# Patient Record
Sex: Female | Born: 1935 | Race: White | Hispanic: No | Marital: Single | Smoking: Never smoker
Health system: Southern US, Community
[De-identification: ages and names within clinical notes are randomized; demographics above are authoritative.]

## PROBLEM LIST (undated history)

## (undated) DIAGNOSIS — M542 Cervicalgia: Secondary | ICD-10-CM

## (undated) DIAGNOSIS — K219 Gastro-esophageal reflux disease without esophagitis: Secondary | ICD-10-CM

## (undated) DIAGNOSIS — H9190 Unspecified hearing loss, unspecified ear: Secondary | ICD-10-CM

## (undated) DIAGNOSIS — E785 Hyperlipidemia, unspecified: Secondary | ICD-10-CM

## (undated) DIAGNOSIS — R51 Headache: Secondary | ICD-10-CM

## (undated) DIAGNOSIS — H9192 Unspecified hearing loss, left ear: Secondary | ICD-10-CM

## (undated) DIAGNOSIS — N3020 Other chronic cystitis without hematuria: Secondary | ICD-10-CM

## (undated) DIAGNOSIS — I10 Essential (primary) hypertension: Secondary | ICD-10-CM

## (undated) DIAGNOSIS — R42 Dizziness and giddiness: Secondary | ICD-10-CM

## (undated) DIAGNOSIS — E039 Hypothyroidism, unspecified: Secondary | ICD-10-CM

## (undated) DIAGNOSIS — R519 Headache, unspecified: Secondary | ICD-10-CM

## (undated) DIAGNOSIS — K449 Diaphragmatic hernia without obstruction or gangrene: Secondary | ICD-10-CM

## (undated) DIAGNOSIS — N3946 Mixed incontinence: Secondary | ICD-10-CM

## (undated) DIAGNOSIS — J449 Chronic obstructive pulmonary disease, unspecified: Secondary | ICD-10-CM

## (undated) DIAGNOSIS — M1990 Unspecified osteoarthritis, unspecified site: Secondary | ICD-10-CM

## (undated) DIAGNOSIS — Z974 Presence of external hearing-aid: Secondary | ICD-10-CM

## (undated) DIAGNOSIS — F32A Depression, unspecified: Secondary | ICD-10-CM

## (undated) HISTORY — PX: TONSILLECTOMY: SUR1361

## (undated) HISTORY — PX: COLONOSCOPY: SHX174

## (undated) HISTORY — DX: Gastro-esophageal reflux disease without esophagitis: K21.9

## (undated) HISTORY — DX: Depression, unspecified: F32.A

## (undated) HISTORY — PX: FOOT SURGERY: SHX648

## (undated) HISTORY — PX: EYE SURGERY: SHX253

## (undated) HISTORY — PX: GLAUCOMA SURGERY: SHX656

## (undated) HISTORY — DX: Other chronic cystitis without hematuria: N30.20

## (undated) HISTORY — PX: CHOLECYSTECTOMY: SHX55

## (undated) HISTORY — DX: Mixed incontinence: N39.46

## (undated) HISTORY — DX: Hyperlipidemia, unspecified: E78.5

## (undated) HISTORY — PX: CATARACT EXTRACTION W/ INTRAOCULAR LENS IMPLANT: SHX1309

## (undated) HISTORY — DX: Diaphragmatic hernia without obstruction or gangrene: K44.9

## (undated) HISTORY — PX: ABDOMINAL HYSTERECTOMY: SHX81

## (undated) HISTORY — DX: Cervicalgia: M54.2

## (undated) NOTE — *Deleted (*Deleted)
09/27/2020 10:17 AM   [place] March 12, 1931 [id number]  Referring provider: Alan Mulder, MD [address] [place],  [place] [address] No chief complaint on file.  Urologic history: 1.  Recurrent UTI -Low-dose vaginal estrogen  2.  Overactive bladder with urge incontinence -Has opted for no management  HPI: Pamela Reed is a 67 y.o. female who returns for annual follow up of overactive bladder and rUTI.  -The patient has had *** infections since last year. -Her lower urinary tract symptoms are stable ** -No hematuria or dysuria ***  1. Overactive bladder  2. rUTI  PMH: Past Medical History:  Diagnosis Date  . Arthritis    spine  . Cervicalgia   . Chronic cystitis   . Deafness in left ear    s/p skull fracture - age 30  . GERD (gastroesophageal reflux disease)   . Headache    daily. s/p skull fracture as 65 yr old  . 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 Hypertension   . Hypothyroidism   . Mixed incontinence urge and stress   . Vertigo   . Wears hearing aid    right ear    Surgical History: Past Surgical History:  Procedure Laterality Date  . ABDOMINAL HYSTERECTOMY    . CATARACT EXTRACTION W/ INTRAOCULAR LENS IMPLANT    . CHOLECYSTECTOMY    . COLONOSCOPY    . EYE SURGERY Bilateral    eye shunts  . FOOT SURGERY    . GLAUCOMA SURGERY    . PHOTOCOAGULATION WITH LASER Left 09/07/2016   Procedure: PHOTOCOAGULATION WITH LASER;  Surgeon: Sherald Hess, MD;  Location: [facility];  Service: Ophthalmology;  Laterality: Left;  LEFT  . TONSILLECTOMY      Home Medications:  Allergies as of 09/27/2020      Reactions   Contrast Media [iodinated Diagnostic Agents] Shortness Of Breath   Iodine Shortness Of Breath   Sulfasalazine Itching, Swelling   Dye Fdc Red [red Dye]    Lactose Intolerance (gi)    GI upset   Penicillins    Childhood event-unknown reaction   Shellfish Allergy Nausea Only, Swelling   Throat  swelling   Sulfa Antibiotics Itching, Swelling   Venlafaxine Swelling   Throat   Codeine Palpitations   Ropinirole Nausea Only      Medication List       Accurate as of September 26, 2020 10:17 AM. If you have any questions, ask your nurse or doctor.        aspirin 81 MG tablet Take 81 mg by mouth daily.   FLUoxetine 10 MG tablet Commonly known as: PROZAC Take 10 mg by mouth daily.   furosemide 20 MG tablet Commonly known as: LASIX Take 20 mg by mouth daily.   levothyroxine 50 MCG tablet Commonly known as: SYNTHROID Take 50 mcg by mouth daily before breakfast.   lisinopril 20 MG tablet Commonly known as: ZESTRIL Take 20 mg by mouth 2 (two) times daily.   Magnesium 500 MG Tabs Take 500 mg by mouth daily.   metoprolol succinate 25 MG 24 hr tablet Commonly known as: TOPROL-XL Take 25 mg by mouth 2 (two) times daily.   omeprazole 20 MG tablet Commonly known as: PRILOSEC OTC Take 20 mg by mouth daily as needed (heartburn).   ondansetron 4 MG tablet Commonly known as: Zofran Take 1 tablet (4 mg total) by mouth daily as needed for nausea or vomiting.  Rhopressa 0.02 % Soln Generic drug: Netarsudil Dimesylate Place 1 drop into both eyes at bedtime.   Vyzulta 0.024 % Soln Generic drug: Latanoprostene Bunod Place 1 drop into both eyes at bedtime.       Allergies:  Allergies  Allergen Reactions  . Contrast Media [Iodinated Diagnostic Agents] Shortness Of Breath  . Iodine Shortness Of Breath  . Sulfasalazine Itching and Swelling  . Dye Fdc Red [Red Dye]   . Lactose Intolerance (Gi)     GI upset  . Penicillins     Childhood event-unknown reaction  . Shellfish Allergy Nausea Only and Swelling    Throat swelling  . Sulfa Antibiotics Itching and Swelling  . Venlafaxine Swelling    Throat  . Codeine Palpitations  . Ropinirole Nausea Only    Family History: No family history on file.  Social History:  reports that she has never smoked. She has never  used smokeless tobacco. She reports that she does not drink alcohol and does not use drugs.   Physical Exam: There were no vitals taken for this visit.  Constitutional:  Alert and oriented, No acute distress. HEENT:  AT, moist mucus membranes.  Trachea midline, no masses. Cardiovascular: No clubbing, cyanosis, or edema. Respiratory: Normal respiratory effort, no increased work of breathing. GI: Abdomen is soft, nontender, nondistended, no abdominal masses GU: No CVA tenderness Lymph: No cervical or inguinal lymphadenopathy. Skin: No rashes, bruises or suspicious lesions. Neurologic: Grossly intact, no focal deficits, moving all 4 extremities. Psychiatric: Normal mood and affect.  Laboratory Data:  Lab Results  Component Value Date   CREATININE 0.88 12/22/2019    No results found for: PSA  No results found for: TESTOSTERONE  No results found for: HGBA1C  Urinalysis   Pertinent Imaging: *** No results found for this or any previous visit.  No results found for this or any previous visit.  No results found for this or any previous visit.  No results found for this or any previous visit.  No results found for this or any previous visit.  No results found for this or any previous visit.  No results found for this or any previous visit.  No results found for this or any previous visit.   Assessment & Plan:     No follow-ups on file.  [facility] [address] [place], [place] [address] [phone]  I, Theador Hawthorne, am acting as a scribe for Dr. Lorin Picket C. Stoioff,  {Add Holiday representative

---

## 2004-11-11 ENCOUNTER — Ambulatory Visit: Payer: Self-pay

## 2005-02-06 ENCOUNTER — Ambulatory Visit: Payer: Self-pay | Admitting: Specialist

## 2008-03-06 ENCOUNTER — Ambulatory Visit: Payer: Self-pay | Admitting: Endocrinology

## 2008-03-26 ENCOUNTER — Encounter: Payer: Self-pay | Admitting: General Practice

## 2008-04-23 ENCOUNTER — Encounter: Payer: Self-pay | Admitting: General Practice

## 2008-05-23 ENCOUNTER — Encounter: Payer: Self-pay | Admitting: General Practice

## 2009-09-13 ENCOUNTER — Ambulatory Visit: Payer: Self-pay | Admitting: Endocrinology

## 2009-09-24 ENCOUNTER — Ambulatory Visit: Payer: Self-pay | Admitting: Endocrinology

## 2011-01-06 ENCOUNTER — Ambulatory Visit: Payer: Self-pay | Admitting: Endocrinology

## 2011-09-21 ENCOUNTER — Other Ambulatory Visit: Payer: Self-pay | Admitting: Unknown Physician Specialty

## 2011-09-28 ENCOUNTER — Ambulatory Visit: Payer: Self-pay | Admitting: Unknown Physician Specialty

## 2012-08-22 ENCOUNTER — Ambulatory Visit: Payer: Self-pay | Admitting: Neurology

## 2012-12-17 DIAGNOSIS — N3946 Mixed incontinence: Secondary | ICD-10-CM | POA: Insufficient documentation

## 2012-12-17 DIAGNOSIS — N3020 Other chronic cystitis without hematuria: Secondary | ICD-10-CM | POA: Insufficient documentation

## 2015-01-30 ENCOUNTER — Emergency Department: Payer: Self-pay | Admitting: Emergency Medicine

## 2016-07-24 ENCOUNTER — Encounter: Payer: Self-pay | Admitting: Anesthesiology

## 2016-07-24 ENCOUNTER — Encounter: Payer: Self-pay | Admitting: *Deleted

## 2016-07-31 NOTE — Discharge Instructions (Signed)

## 2016-08-03 ENCOUNTER — Ambulatory Visit
Admission: EM | Admit: 2016-08-03 | Discharge: 2016-08-03 | Disposition: A | Discharge status: Home / Self Care | Payer: PPO | Attending: Family Medicine | Admitting: Family Medicine

## 2016-08-03 ENCOUNTER — Encounter
Admission: RE | Disposition: A | Discharge status: Home / Self Care | Payer: Self-pay | Source: Ambulatory Visit | Attending: Ophthalmology

## 2016-08-03 ENCOUNTER — Encounter: Payer: Self-pay | Admitting: *Deleted

## 2016-08-03 ENCOUNTER — Ambulatory Visit (INDEPENDENT_AMBULATORY_CARE_PROVIDER_SITE_OTHER): Payer: PPO

## 2016-08-03 ENCOUNTER — Ambulatory Visit
Admission: RE | Admit: 2016-08-03 | Discharge: 2016-08-03 | Disposition: A | Discharge status: Home / Self Care | Payer: PPO | Source: Ambulatory Visit | Attending: Ophthalmology | Admitting: Ophthalmology

## 2016-08-03 DIAGNOSIS — R05 Cough: Secondary | ICD-10-CM

## 2016-08-03 DIAGNOSIS — Z538 Procedure and treatment not carried out for other reasons: Secondary | ICD-10-CM | POA: Diagnosis not present

## 2016-08-03 DIAGNOSIS — R062 Wheezing: Secondary | ICD-10-CM

## 2016-08-03 DIAGNOSIS — R059 Cough, unspecified: Secondary | ICD-10-CM

## 2016-08-03 HISTORY — DX: Unspecified hearing loss, left ear: H91.92

## 2016-08-03 HISTORY — DX: Dizziness and giddiness: R42

## 2016-08-03 HISTORY — DX: Unspecified hearing loss, unspecified ear: H91.90

## 2016-08-03 HISTORY — DX: Essential (primary) hypertension: I10

## 2016-08-03 HISTORY — DX: Headache, unspecified: R51.9

## 2016-08-03 HISTORY — DX: Gastro-esophageal reflux disease without esophagitis: K21.9

## 2016-08-03 HISTORY — DX: Hypothyroidism, unspecified: E03.9

## 2016-08-03 HISTORY — DX: Unspecified osteoarthritis, unspecified site: M19.90

## 2016-08-03 HISTORY — DX: Headache: R51

## 2016-08-03 HISTORY — DX: Presence of external hearing-aid: Z97.4

## 2016-08-03 SURGERY — PHOTOCOAGULATION, EYE, USING LASER
Anesthesia: Regional | Laterality: Left

## 2016-08-03 MED ORDER — ALBUTEROL SULFATE HFA 108 (90 BASE) MCG/ACT IN AERS: 1.0000 | INHALATION_SPRAY | Freq: Four times a day (QID) | RESPIRATORY_TRACT | 0 refills | Status: DC | PRN

## 2016-08-03 SURGICAL SUPPLY — 11 items
BANDAGE EYE OVAL (MISCELLANEOUS) ×6 IMPLANT
DEVICE G-PROBE SGL USE (Laser) IMPLANT
DEVICE MICRO PULS P3 SGL USE (Laser) IMPLANT
G-PROBE SGL USE (Laser)
GAUZE SPONGE 4X4 12PLY STRL (GAUZE/BANDAGES/DRESSINGS) ×3 IMPLANT
NDL RETROBULBAR .5 NSTRL (NEEDLE) ×3 IMPLANT
NEEDLE FILTER BLUNT 18X 1/2SAF (NEEDLE) ×2
NEEDLE FILTER BLUNT 18X1 1/2 (NEEDLE) ×1 IMPLANT
SYRINGE 10CC LL (SYRINGE) ×3 IMPLANT
WATER STERILE IRR 250ML POUR (IV SOLUTION) ×3 IMPLANT
WATER STERILE IRR 500ML POUR (IV SOLUTION) IMPLANT

## 2016-08-03 NOTE — ED Provider Notes (Signed)
[facility]    CSN: [id number] Arrival date & time: 08/03/16  1113  First Provider Contact:  First MD Initiated Contact with Patient 08/03/16 1146        History   Chief Complaint Chief Complaint  Patient presents with  . Cough    HPI Pamela Reed is a 82 y.o. female.   The history is provided by the patient.  Cough   Patient started having symptoms of chest congestion and cough one week ago. Patient arrived for procedure today and procedure was canceled due to significant lung congestion. (as above history per RN, confirmed with patient); denies any fevers, chills. States has a h/o allergies. Took some zyrtec for a few days last week, but stopped because she was scheduled for eye procedure today.          Past Medical History:  Diagnosis Date  . Arthritis    spine  . Deafness in left ear    s/p skull fracture - age 89  . GERD (gastroesophageal reflux disease)   . Headache    daily. s/p skull fracture as 82 yr old  . 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 Hypertension   . Hypothyroidism   . Vertigo   . Wears hearing aid    right ear    There are no active problems to display for this patient.   Past Surgical History:  Procedure Laterality Date  . ABDOMINAL HYSTERECTOMY    . CATARACT EXTRACTION W/ INTRAOCULAR LENS IMPLANT    . CHOLECYSTECTOMY    . COLONOSCOPY    . EYE SURGERY Bilateral    eye shunts  . FOOT SURGERY    . TONSILLECTOMY      OB History    No data available       Home Medications    Prior to Admission medications   Medication Sig Start Date End Date Taking? Authorizing Provider  albuterol (PROVENTIL HFA;VENTOLIN HFA) 108 (90 Base) MCG/ACT inhaler Inhale 1-2 puffs into the lungs every 6 (six) hours as needed for wheezing or shortness of breath. 08/03/16   Payton Mccallumrlando Ajanay Farve, MD  ARTIFICIAL TEAR OP Apply to eye.    Historical Provider, MD  aspirin 81 MG tablet Take 81 mg by mouth daily.    Historical Provider, MD    bimatoprost (LUMIGAN) 0.01 % SOLN 1 drop at bedtime.    Historical Provider, MD  estradiol (ESTRACE) 0.5 MG tablet Take 0.5 mg by mouth daily.    Historical Provider, MD  Fexofenadine HCl (ALLEGRA ALLERGY PO) Take by mouth.    Historical Provider, MD  levothyroxine (SYNTHROID, LEVOTHROID) 50 MCG tablet Take 50 mcg by mouth daily before breakfast.    Historical Provider, MD  lisinopril-hydrochlorothiazide (PRINZIDE,ZESTORETIC) 20-12.5 MG tablet Take 1 tablet by mouth daily.    Historical Provider, MD  MAGNESIUM CARBONATE PO Take by mouth.    Historical Provider, MD  nebivolol (BYSTOLIC) 5 MG tablet Take 5 mg by mouth daily.    Historical Provider, MD  omeprazole (PRILOSEC) 20 MG capsule Take 20 mg by mouth daily.    Historical Provider, MD    Family History History reviewed. No pertinent family history.  Social History Social History  Substance Use Topics  . Smoking status: Never Smoker  . Smokeless tobacco: Never Used  . Alcohol use No     Allergies   Contrast media [iodinated diagnostic agents]; Dye fdc red [red dye]; Lactose intolerance (gi); Penicillins; Shellfish allergy; Sulfa  antibiotics; Venlafaxine; and Codeine   Review of Systems Review of Systems  Respiratory: Positive for cough.      Physical Exam Triage Vital Signs ED Triage Vitals  Enc Vitals Group     BP 08/03/16 1132 (!) 158/60     Pulse Rate 08/03/16 1132 65     Resp 08/03/16 1132 18     Temp 08/03/16 1132 (!) 96.9 F (36.1 C)     Temp Source 08/03/16 1132 Oral     SpO2 08/03/16 1132 100 %     Weight 08/03/16 1133 176 lb (79.8 kg)     Height 08/03/16 1133 5\' 2"  (1.575 m)     Head Circumference --      Peak Flow --      Pain Score 08/03/16 1135 0     Pain Loc --      Pain Edu? --      Excl. in GC? --    No data found.   Updated Vital Signs BP (!) 158/60 (BP Location: Left Arm)   Pulse 65   Temp (!) 96.9 F (36.1 C) (Oral)   Resp 18   Ht 5\' 2"  (1.575 m)   Wt 176 lb (79.8 kg)   SpO2 100%    BMI 32.19 kg/m   Visual Acuity Right Eye Distance:   Left Eye Distance:   Bilateral Distance:    Right Eye Near:   Left Eye Near:    Bilateral Near:     Physical Exam  Constitutional: She appears well-developed and well-nourished. No distress.  HENT:  Head: Normocephalic and atraumatic.  Right Ear: Tympanic membrane, external ear and ear canal normal.  Left Ear: Tympanic membrane, external ear and ear canal normal.  Nose: No mucosal edema, rhinorrhea, nose lacerations, sinus tenderness, nasal deformity, septal deviation or nasal septal hematoma. No epistaxis.  No foreign bodies. Right sinus exhibits no maxillary sinus tenderness and no frontal sinus tenderness. Left sinus exhibits no maxillary sinus tenderness and no frontal sinus tenderness.  Mouth/Throat: Uvula is midline, oropharynx is clear and moist and mucous membranes are normal. No oropharyngeal exudate.  Eyes: Conjunctivae and EOM are normal. Pupils are equal, round, and reactive to light. Right eye exhibits no discharge. Left eye exhibits no discharge. No scleral icterus.  Neck: Normal range of motion. Neck supple. No thyromegaly present.  Cardiovascular: Normal rate, regular rhythm and normal heart sounds.   Pulmonary/Chest: Effort normal. No respiratory distress. She has wheezes (few diffuse expiratory ). She has no rales.  Lymphadenopathy:    She has no cervical adenopathy.  Skin: She is not diaphoretic.  Nursing note and vitals reviewed.    UC Treatments / Results  Labs (all labs ordered are listed, but only abnormal results are displayed) Labs Reviewed - No data to display  EKG  EKG Interpretation None       Radiology Dg Chest 2 View  Result Date: 08/03/2016 CLINICAL DATA:  Cough and wheezing for 6 days. EXAM: CHEST  2 VIEW COMPARISON:  None. FINDINGS: The heart size and mediastinal contours are within normal limits. Both lungs are clear. The visualized skeletal structures are unremarkable. IMPRESSION:  Normal chest x-ray. Electronically Signed   By: Rudie Meyer M.D.   On: 08/03/2016 12:09    Procedures Procedures (including critical care time)  Medications Ordered in UC Medications - No data to display   Initial Impression / Assessment and Plan / UC Course  I have reviewed the triage vital signs and the nursing notes.  Pertinent labs & imaging results that were available during my care of the patient were reviewed by me and considered in my medical decision making (see chart for details).  Clinical Course     Final Clinical Impressions(s) / UC Diagnoses   Final diagnoses:  Cough  Wheezing    New Prescriptions Discharge Medication List as of 08/03/2016 12:27 PM    START taking these medications   Details  albuterol (PROVENTIL HFA;VENTOLIN HFA) 108 (90 Base) MCG/ACT inhaler Inhale 1-2 puffs into the lungs every 6 (six) hours as needed for wheezing or shortness of breath., Starting Mon 08/03/2016, Normal       1. Chest x-ray results (negative) and diagnosis reviewed with patient 2. rx as per orders above; reviewed possible side effects, interactions, risks and benefits  3. Recommend supportive treatment with otc allergy medications 4. Follow-up prn if symptoms worsen or don't improve   Payton Mccallum, MD 08/03/16 1240

## 2016-08-03 NOTE — ED Triage Notes (Signed)
Patient started having symptoms of chest congestion and cough one week ago. Patient arrived for procedure today and procedure was canceled due to significant lung congestion.

## 2016-08-03 NOTE — H&P (Signed)
H+P reviewed and is up to date, please see paper chart.  

## 2016-08-03 NOTE — Anesthesia Preprocedure Evaluation (Deleted)
Anesthesia Evaluation    Airway        Dental   Pulmonary    + rhonchi    rales    Cardiovascular hypertension,      Neuro/Psych    GI/Hepatic   Endo/Other    Renal/GU      Musculoskeletal   Abdominal   Peds  Hematology   Anesthesia Other Findings Bilateral rhonchi and rales.  Reproductive/Obstetrics                             Anesthesia Physical Anesthesia Plan  ASA:   Anesthesia Plan:    Post-op Pain Management:    Induction:   Airway Management Planned:   Additional Equipment:   Intra-op Plan:   Post-operative Plan:   Informed Consent:   Plan Discussed with:   Anesthesia Plan Comments: (Pt appears to have B pneumonia.  Case canceled and pt sent to PCP for evaluation and Tx.)        Anesthesia Quick Evaluation

## 2016-08-03 NOTE — Progress Notes (Signed)
Cancelled per anesthesia and surgeon due to sickness

## 2016-09-04 NOTE — Discharge Instructions (Signed)

## 2016-09-07 ENCOUNTER — Ambulatory Visit
Admission: RE | Admit: 2016-09-07 | Discharge: 2016-09-07 | Disposition: A | Discharge status: Home / Self Care | Payer: PPO | Source: Ambulatory Visit | Attending: Ophthalmology | Admitting: Ophthalmology

## 2016-09-07 ENCOUNTER — Ambulatory Visit: Payer: PPO | Admitting: Anesthesiology

## 2016-09-07 ENCOUNTER — Encounter
Admission: RE | Disposition: A | Discharge status: Home / Self Care | Payer: Self-pay | Source: Ambulatory Visit | Attending: Ophthalmology

## 2016-09-07 ENCOUNTER — Encounter: Payer: Self-pay | Admitting: *Deleted

## 2016-09-07 DIAGNOSIS — I10 Essential (primary) hypertension: Secondary | ICD-10-CM | POA: Diagnosis not present

## 2016-09-07 DIAGNOSIS — K219 Gastro-esophageal reflux disease without esophagitis: Secondary | ICD-10-CM | POA: Insufficient documentation

## 2016-09-07 DIAGNOSIS — E039 Hypothyroidism, unspecified: Secondary | ICD-10-CM | POA: Diagnosis not present

## 2016-09-07 DIAGNOSIS — H401123 Primary open-angle glaucoma, left eye, severe stage: Secondary | ICD-10-CM | POA: Diagnosis present

## 2016-09-07 HISTORY — PX: PHOTOCOAGULATION WITH LASER: SHX6027

## 2016-09-07 SURGERY — PHOTOCOAGULATION, EYE, USING LASER
Anesthesia: Monitor Anesthesia Care | Laterality: Left | Wound class: Clean Contaminated

## 2016-09-07 MED ORDER — TETRACAINE HCL 0.5 % OP SOLN
OPHTHALMIC | Status: DC | PRN
  Administered 2016-09-07: 1 [drp] via OPHTHALMIC

## 2016-09-07 MED ORDER — LACTATED RINGERS IV SOLN: INTRAVENOUS | Status: DC

## 2016-09-07 MED ORDER — ACETAMINOPHEN 325 MG PO TABS
325.0000 mg | ORAL_TABLET | ORAL | Status: DC | PRN
  Administered 2016-09-07: 650 mg via ORAL

## 2016-09-07 MED ORDER — ACETAMINOPHEN 160 MG/5ML PO SOLN: 325.0000 mg | ORAL | Status: DC | PRN

## 2016-09-07 MED ORDER — MIDAZOLAM HCL 2 MG/2ML IJ SOLN
INTRAMUSCULAR | Status: DC | PRN
  Administered 2016-09-07 (×2): 1 mg via INTRAVENOUS

## 2016-09-07 MED ORDER — ALFENTANIL 500 MCG/ML IJ INJ
INJECTION | INTRAMUSCULAR | Status: DC | PRN
  Administered 2016-09-07: 400 ug via INTRAVENOUS
  Administered 2016-09-07: 600 ug via INTRAVENOUS

## 2016-09-07 SURGICAL SUPPLY — 11 items
BANDAGE EYE OVAL (MISCELLANEOUS) IMPLANT
DEVICE G-PROBE SGL USE (Laser) IMPLANT
DEVICE MICRO PULS P3 SGL USE (Laser) ×3 IMPLANT
G-PROBE SGL USE (Laser)
GAUZE SPONGE 4X4 12PLY STRL (GAUZE/BANDAGES/DRESSINGS) ×3 IMPLANT
NDL RETROBULBAR .5 NSTRL (NEEDLE) IMPLANT
NEEDLE FILTER BLUNT 18X 1/2SAF (NEEDLE)
NEEDLE FILTER BLUNT 18X1 1/2 (NEEDLE) IMPLANT
SYRINGE 10CC LL (SYRINGE) IMPLANT
WATER STERILE IRR 250ML POUR (IV SOLUTION) ×3 IMPLANT
WATER STERILE IRR 500ML POUR (IV SOLUTION) IMPLANT

## 2016-09-07 NOTE — Anesthesia Postprocedure Evaluation (Signed)
Anesthesia Post Note  Patient: Pamela Reed  Procedure(s) Performed: Procedure(s) (LRB): PHOTOCOAGULATION WITH LASER (Left)  Patient location during evaluation: PACU Anesthesia Type: MAC Level of consciousness: awake and alert and oriented Pain management: satisfactory to patient Vital Signs Assessment: post-procedure vital signs reviewed and stable Respiratory status: spontaneous breathing, nonlabored ventilation and respiratory function stable Cardiovascular status: blood pressure returned to baseline and stable Postop Assessment: Adequate PO intake and No signs of nausea or vomiting Anesthetic complications: no    [name], [name] J

## 2016-09-07 NOTE — Anesthesia Preprocedure Evaluation (Signed)
Anesthesia Evaluation  Patient identified by MRN, date of birth, ID band Patient awake    Reviewed: Allergy & Precautions, H&P , NPO status , Patient's Chart, lab work & pertinent test results  Airway Mallampati: II  TM Distance: >3 FB Neck ROM: full    Dental no notable dental hx.    Pulmonary    Pulmonary exam normal        Cardiovascular hypertension, Normal cardiovascular exam     Neuro/Psych    GI/Hepatic GERD  ,  Endo/Other  Hypothyroidism   Renal/GU      Musculoskeletal   Abdominal   Peds  Hematology   Anesthesia Other Findings   Reproductive/Obstetrics                             Anesthesia Physical Anesthesia Plan  ASA: II  Anesthesia Plan: MAC   Post-op Pain Management:    Induction:   Airway Management Planned:   Additional Equipment:   Intra-op Plan:   Post-operative Plan:   Informed Consent: I have reviewed the patients History and Physical, chart, labs and discussed the procedure including the risks, benefits and alternatives for the proposed anesthesia with the patient or authorized representative who has indicated his/her understanding and acceptance.     Plan Discussed with:   Anesthesia Plan Comments:         Anesthesia Quick Evaluation  

## 2016-09-07 NOTE — Op Note (Signed)
DATE OF SURGERY: 09/07/2016  PREOPERATIVE DIAGNOSES: Severe stage primary open angle glaucoma, left eye  POSTOPERATIVE DIAGNOSES: Same  PROCEDURES PERFORMED: Transscleral diode cyclophotocoagulation, left eye  SURGEON: Devin GoingAnita P. Vin, M.D.  ANESTHESIA: MAC  COMPLICATIONS: None.  INDICATIONS FOR PROCEDURE: Golden Circleda C Bagnell is a 82 y.o. year-old female with uncontrolled primary open angle glaucoma. The risks and benefits of glaucoma surgery were discussed with the patient, and she consented for a diode laser surgery.  PROCEDURE IN DETAIL: The eye for surgery was verified during the time-out procedure in the operating room. A micropulse probe was applied to each hemilimbus with the following settings: 2000mW, 31.3% duty cycle, 120 seconds. The patient tolerated the procedure well and was transferred to the Post-operative Care Unit in stable condition.

## 2016-09-07 NOTE — OR Nursing (Signed)
.   A micropulse probe was applied to each hemilimbus with the following settings: 2000mW, 31.3% duty cycle, 120 seconds.    

## 2016-09-07 NOTE — Anesthesia Procedure Notes (Signed)
Procedure Name: MAC Performed by: Jovahn Breit Pre-anesthesia Checklist: Patient identified, Emergency Drugs available, Suction available, Timeout performed and Patient being monitored Patient Re-evaluated:Patient Re-evaluated prior to inductionOxygen Delivery Method: Nasal cannula Placement Confirmation: positive ETCO2       

## 2016-09-07 NOTE — H&P (Signed)
H+P reviewed and is up to date, please see paper chart.  

## 2016-09-07 NOTE — Transfer of Care (Signed)
Immediate Anesthesia Transfer of Care Note  Patient: Pamela Reed  Procedure(s) Performed: Procedure(s) with comments: PHOTOCOAGULATION WITH LASER (Left) - LEFT  Patient Location: PACU  Anesthesia Type: MAC  Level of Consciousness: awake, alert  and patient cooperative  Airway and Oxygen Therapy: Patient Spontanous Breathing and Patient connected to supplemental oxygen  Post-op Assessment: Post-op Vital signs reviewed, Patient's Cardiovascular Status Stable, Respiratory Function Stable, Patent Airway and No signs of Nausea or vomiting  Post-op Vital Signs: Reviewed and stable  Complications: No apparent anesthesia complications

## 2016-09-08 ENCOUNTER — Encounter: Payer: Self-pay | Admitting: Ophthalmology

## 2016-11-18 DIAGNOSIS — E538 Deficiency of other specified B group vitamins: Secondary | ICD-10-CM | POA: Insufficient documentation

## 2016-12-18 ENCOUNTER — Other Ambulatory Visit: Payer: Self-pay | Admitting: Family Medicine

## 2016-12-23 ENCOUNTER — Other Ambulatory Visit: Payer: Self-pay | Admitting: Family Medicine

## 2016-12-24 ENCOUNTER — Other Ambulatory Visit: Payer: Self-pay | Admitting: Family Medicine

## 2016-12-25 ENCOUNTER — Other Ambulatory Visit: Payer: Self-pay | Admitting: Family Medicine

## 2017-01-01 DIAGNOSIS — G2581 Restless legs syndrome: Secondary | ICD-10-CM | POA: Insufficient documentation

## 2017-10-07 ENCOUNTER — Other Ambulatory Visit: Payer: Self-pay

## 2017-10-07 ENCOUNTER — Emergency Department
Admission: EM | Admit: 2017-10-07 | Discharge: 2017-10-08 | Disposition: A | Discharge status: Home / Self Care | Payer: Medicare HMO | Attending: Emergency Medicine | Admitting: Emergency Medicine

## 2017-10-07 ENCOUNTER — Encounter: Payer: Self-pay | Admitting: Emergency Medicine

## 2017-10-07 DIAGNOSIS — E039 Hypothyroidism, unspecified: Secondary | ICD-10-CM | POA: Diagnosis not present

## 2017-10-07 DIAGNOSIS — I10 Essential (primary) hypertension: Secondary | ICD-10-CM

## 2017-10-07 DIAGNOSIS — Z79899 Other long term (current) drug therapy: Secondary | ICD-10-CM | POA: Diagnosis not present

## 2017-10-07 DIAGNOSIS — M542 Cervicalgia: Secondary | ICD-10-CM | POA: Insufficient documentation

## 2017-10-07 DIAGNOSIS — Z7982 Long term (current) use of aspirin: Secondary | ICD-10-CM | POA: Diagnosis not present

## 2017-10-07 DIAGNOSIS — R51 Headache: Secondary | ICD-10-CM | POA: Diagnosis not present

## 2017-10-07 LAB — BASIC METABOLIC PANEL
ANION GAP: 9 (ref 5–15)
BUN: 15 mg/dL (ref 6–20)
CO2: 23 mmol/L (ref 22–32)
Calcium: 9.2 mg/dL (ref 8.9–10.3)
Chloride: 106 mmol/L (ref 101–111)
Creatinine, Ser: 1.11 mg/dL — ABNORMAL HIGH (ref 0.44–1.00)
GFR calc Af Amer: 51 mL/min — ABNORMAL LOW (ref >60)
GFR calc non Af Amer: 44 mL/min — ABNORMAL LOW (ref >60)
GLUCOSE: 105 mg/dL — AB (ref 65–99)
POTASSIUM: 3.9 mmol/L (ref 3.5–5.1)
Sodium: 138 mmol/L (ref 135–145)

## 2017-10-07 LAB — CBC
HEMATOCRIT: 35.9 % (ref 35.0–47.0)
Hemoglobin: 11.8 g/dL — ABNORMAL LOW (ref 12.0–16.0)
MCH: 28.7 pg (ref 26.0–34.0)
MCHC: 32.9 g/dL (ref 32.0–36.0)
MCV: 87.3 fL (ref 80.0–100.0)
Platelets: 276 10*3/uL (ref 150–440)
RBC: 4.12 MIL/uL (ref 3.80–5.20)
RDW: 15 % — ABNORMAL HIGH (ref 11.5–14.5)
WBC: 6.7 10*3/uL (ref 3.6–11.0)

## 2017-10-07 LAB — TROPONIN I: Troponin I: <0.03 ng/mL (ref <0.03)

## 2017-10-07 MED ORDER — NITROGLYCERIN 2 % TD OINT
0.5000 [in_us] | TOPICAL_OINTMENT | Freq: Once | TRANSDERMAL | Status: AC
  Administered 2017-10-07: 0.5 [in_us] via TOPICAL
  Filled 2017-10-07: qty 1

## 2017-10-07 NOTE — ED Triage Notes (Signed)
Pt presents to ED with headache for the past 2 weeks and elevated blood pressure today. Pt states she checked her blood pressure at home and "it kept getting higher and higher". Pt missed one dose of her blood pressure medication yesterday. Pt speaking clearly and able to answer all questions without difficulty.

## 2017-10-07 NOTE — ED Provider Notes (Signed)
[facility] Emergency Department Provider Note   First MD Initiated Contact with Patient 10/07/17 2304     (approximate)  I have reviewed the triage vital signs and the nursing notes.   HISTORY  Chief Complaint Hypertension    HPI Pamela Reed is a 83 y.o. female presents to the emergency department with elevated blood pressure with systolic greater than 200 at home as well as a headache that has intermittently occurred for the past 2 weeks.  Patient admits to generalized headache and posterior neck pain.  Patient denies any recent change in her blood pressure management.  Patient states that she did miss her dose of antihypertensive yesterday.  Patient denies any weakness no numbness gait instability or visual changes.  Patient denies any chest pain or shortness of breath.  Patient denies any palpitations.   Past Medical History:  Diagnosis Date  . Arthritis    spine  . Deafness in left ear    s/p skull fracture - age 71  . GERD (gastroesophageal reflux disease)   . Headache    daily. s/p skull fracture as 83 yr old  . 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 Hypertension   . Hypothyroidism   . Vertigo   . Wears hearing aid    right ear    There are no active problems to display for this patient.   Past Surgical History:  Procedure Laterality Date  . ABDOMINAL HYSTERECTOMY    . CATARACT EXTRACTION W/ INTRAOCULAR LENS IMPLANT    . CHOLECYSTECTOMY    . COLONOSCOPY    . EYE SURGERY Bilateral    eye shunts  . FOOT SURGERY    . GLAUCOMA SURGERY    . PHOTOCOAGULATION WITH LASER Left 09/07/2016   Performed by Sherald Hess, MD at [facility]  . TONSILLECTOMY      Prior to Admission medications   Medication Sig Start Date End Date Taking? Authorizing Provider  albuterol (PROVENTIL HFA;VENTOLIN HFA) 108 (90 Base) MCG/ACT inhaler Inhale 1-2 puffs into the lungs every 6 (six) hours as needed for wheezing or  shortness of breath. 08/03/16   Payton Mccallum, MD  ARTIFICIAL TEAR OP Apply to eye.    [provider]  aspirin 81 MG tablet Take 81 mg by mouth daily.    [provider]  bimatoprost (LUMIGAN) 0.01 % SOLN 1 drop at bedtime.    [provider]  estradiol (ESTRACE) 0.5 MG tablet Take 0.5 mg by mouth daily.    [provider]  Fexofenadine HCl (ALLEGRA ALLERGY PO) Take by mouth.    [provider]  levothyroxine (SYNTHROID, LEVOTHROID) 50 MCG tablet Take 50 mcg by mouth daily before breakfast.    [provider]  lisinopril (PRINIVIL,ZESTRIL) 30 MG tablet Take 1 tablet (30 mg total) daily by mouth. 10/08/17 11/07/17  Darci Current, MD  lisinopril-hydrochlorothiazide (PRINZIDE,ZESTORETIC) 20-12.5 MG tablet Take 1 tablet by mouth daily.    [provider]  MAGNESIUM CARBONATE PO Take by mouth.    [provider]  nebivolol (BYSTOLIC) 5 MG tablet Take 5 mg by mouth daily.    [provider]  omeprazole (PRILOSEC) 20 MG capsule Take 20 mg by mouth daily.    [provider]    Allergies Contrast media [iodinated diagnostic agents]; Dye fdc red [red dye]; Lactose intolerance (gi); Penicillins; Shellfish allergy; Sulfa antibiotics; Venlafaxine; and Codeine  No family history on file.  Social History Social History   Tobacco Use  . Smoking status: Never Smoker  . Smokeless tobacco: Never Used  Substance Use Topics  . Alcohol use: No  . Drug use: No    Review of Systems Constitutional: No fever/chills Eyes: No visual changes. ENT: No sore throat. Cardiovascular: Denies chest pain. Respiratory: Denies shortness of breath. Gastrointestinal: No abdominal pain.  No nausea, no vomiting.  No diarrhea.  No constipation. Genitourinary: Negative for dysuria. Musculoskeletal: Negative for neck pain.  Negative for back pain. Integumentary: Negative for rash. Neurological: Positive for headaches, negative  for focal weakness or numbness.  ____________________________________________   PHYSICAL EXAM:  VITAL SIGNS: ED Triage Vitals  Enc Vitals Group     BP 10/07/17 2205 (!) 203/76     Pulse Rate 10/07/17 2205 82     Resp 10/07/17 2205 18     Temp 10/07/17 2205 97.7 F (36.5 C)     Temp Source 10/07/17 2205 Oral     SpO2 10/07/17 2205 99 %     Weight 10/07/17 2206 78.5 kg (173 lb)     Height 10/07/17 2206 1.575 m (5\' 2" )     Head Circumference --      Peak Flow --      Pain Score 10/07/17 2204 9     Pain Loc --      Pain Edu? --      Excl. in GC? --     Constitutional: Alert and oriented. Well appearing and in no acute distress. Eyes: Conjunctivae are normal. PERRL. EOMI. Head: Atraumatic. Mouth/Throat: Mucous membranes are moist.  Oropharynx non-erythematous. Neck: No stridor.   Cardiovascular: Normal rate, regular rhythm. Good peripheral circulation. Grossly normal heart sounds. Respiratory: Normal respiratory effort.  No retractions. Lungs CTAB. Gastrointestinal: Soft and nontender. No distention.  Musculoskeletal: No lower extremity tenderness nor edema. No gross deformities of extremities. Neurologic:  Normal speech and language. No gross focal neurologic deficits are appreciated.  Skin:  Skin is warm, dry and intact. No rash noted. Psychiatric: Mood and affect are normal. Speech and behavior are normal.  ____________________________________________   LABS (all labs ordered are listed, but only abnormal results are displayed)  Labs Reviewed  CBC - Abnormal; Notable for the following components:      Result Value   Hemoglobin 11.8 (*)    RDW 15.0 (*)    All other components within normal limits  BASIC METABOLIC PANEL - Abnormal; Notable for the following components:   Glucose, Bld 105 (*)    Creatinine, Ser 1.11 (*)    GFR calc non Af Amer 44 (*)    GFR calc Af Amer 51 (*)    All other components within normal limits  TROPONIN I    ____________________________________________  EKG  Time: 10:34 PM Rate: 80 Rhythm: Normal sinus rhythm Axis: Normal Interval: Normal ST segment and T wave: No ST segment changes or T wave abnormalities  ____________________________________________  RADIOLOGY I, Archbald N Aneli Zara, personally viewed and evaluated these images (plain radiographs) as part of my medical decision making, as well as reviewing the written report by the radiologist.  Mr Maxine Glenn Head Wo Contrast  Result Date: 10/08/2017 CLINICAL DATA:  Initial evaluation for acute headache for past 2 weeks, elevated blood pressure. EXAM: MRI HEAD WITHOUT CONTRAST MRA HEAD WITHOUT CONTRAST MRA NECK WITHOUT AND WITH CONTRAST TECHNIQUE: Multiplanar, multiecho pulse sequences of the brain and surrounding structures were obtained without intravenous contrast. Angiographic images of the Circle of Willis were obtained using  MRA technique without intravenous contrast. Angiographic images of the neck were obtained using MRA technique without and with intravenous contrast. Carotid stenosis measurements (when applicable) are obtained utilizing NASCET criteria, using the distal internal carotid diameter as the denominator. CONTRAST:  16mL MULTIHANCE GADOBENATE DIMEGLUMINE 529 MG/ML IV SOLN COMPARISON:  Previous MRI from 08/22/2012. FINDINGS: MRI HEAD FINDINGS Generalized age appropriate cerebral atrophy. Mild confluent T2/FLAIR hyperintensity within the periventricular white matter, most like related to minimal chronic small vessel ischemic changes, also felt to be within normal limits for age. No abnormal foci of restricted diffusion to suggest acute or subacute ischemia. Gray-white matter differentiation well maintained. No encephalomalacia to suggest chronic infarction. No foci susceptibility artifact to suggest acute or chronic intracranial hemorrhage. No mass lesion, midline shift or mass effect. No hydrocephalus. No extra-axial fluid collection.  Major dural sinuses grossly patent. Incidental note made of a partially empty sella. Midline structures intact and normal. Major intracranial vascular flow voids maintained. Craniocervical junction normal. Upper cervical spine within normal limits. Bone marrow signal intensity within normal limits. No scalp soft tissue abnormality. Globes and orbital soft tissues within normal limits. Patient status post cataract extraction bilaterally. Paranasal sinuses are clear. No mastoid effusion. Inner ear structures normal. MRA HEAD FINDINGS ANTERIOR CIRCULATION: Distal cervical segments of the internal carotid arteries are patent with antegrade flow. Petrous segments widely patent bilaterally. Mild atheromatous irregularity within the cavernous/ supraclinoid ICAs without flow-limiting stenosis. ICA termini widely patent. A1 segments widely patent. Normal anterior communicating artery. Anterior cerebral arteries patent to their distal aspects without stenosis. M1 segments patent without stenosis. Normal MCA bifurcations. No proximal M2 occlusion. Mild distal small vessel atheromatous irregularity within the MCA branches bilaterally. Stenosis. POSTERIOR CIRCULATION: Dominant left vertebral artery widely patent to the vertebrobasilar junction. Right vertebral artery diminutive and largely terminates in PICA, although a tiny branch AA sense towards the vertebrobasilar junction. Posterior inferior cerebral arteries patent bilaterally. Basilar artery widely patent to its distal aspect. Superior cerebral arteries patent bilaterally. Both of the posterior cerebral artery supplied via the basilar. Atheromatous irregularity within the P2 segments bilaterally, left greater than right. Mild to moderate left P2 stenoses. No high-grade flow-limiting stenosis. Small vessel atheromatous irregularity within the distal PCA branches. No aneurysm. MRA NECK FINDINGS Visualized aortic arch normal in caliber with normal branch pattern. No  flow-limiting stenosis about the origin of the great vessels. Visualized subclavian artery is widely patent. Right common and internal carotid artery is widely patent to the circle of Willis without stenosis or occlusion. Right ICA mildly tortuous. No significant atheromatous narrowing about the right carotid bifurcation. Left common and internal carotid artery is widely patent to the circle of Willis without stenosis or occlusion. Left ICA mildly tortuous. No significant atheromatous narrowing about the left carotid bifurcation. Both of the vertebral arteries arise from the subclavian arteries. Left vertebral artery dominant. Right vertebral artery diffusely hypoplastic. Vertebral arteries widely patent within the neck without flow-limiting stenosis or occlusion. IMPRESSION: MRI HEAD IMPRESSION: Normal MRI of the brain for patient age. No acute intracranial abnormality identified. MRA HEAD IMPRESSION: 1. Negative intracranial MRA for large or proximal arterial branch occlusion. 2. Minor atheromatous changes involving the intracranial circulation as above, most notable within the PCAs bilaterally. No high-grade or correctable stenosis. MRA NECK IMPRESSION: Normal MRA of the neck. Electronically Signed   By: Rise Mu M.D.   On: 10/08/2017 03:51   Mr Angiogram Neck W Or Wo Contrast  Result Date: 10/08/2017 CLINICAL DATA:  Initial evaluation for acute  headache for past 2 weeks, elevated blood pressure. EXAM: MRI HEAD WITHOUT CONTRAST MRA HEAD WITHOUT CONTRAST MRA NECK WITHOUT AND WITH CONTRAST TECHNIQUE: Multiplanar, multiecho pulse sequences of the brain and surrounding structures were obtained without intravenous contrast. Angiographic images of the Circle of Willis were obtained using MRA technique without intravenous contrast. Angiographic images of the neck were obtained using MRA technique without and with intravenous contrast. Carotid stenosis measurements (when applicable) are obtained  utilizing NASCET criteria, using the distal internal carotid diameter as the denominator. CONTRAST:  16mL MULTIHANCE GADOBENATE DIMEGLUMINE 529 MG/ML IV SOLN COMPARISON:  Previous MRI from 08/22/2012. FINDINGS: MRI HEAD FINDINGS Generalized age appropriate cerebral atrophy. Mild confluent T2/FLAIR hyperintensity within the periventricular white matter, most like related to minimal chronic small vessel ischemic changes, also felt to be within normal limits for age. No abnormal foci of restricted diffusion to suggest acute or subacute ischemia. Gray-white matter differentiation well maintained. No encephalomalacia to suggest chronic infarction. No foci susceptibility artifact to suggest acute or chronic intracranial hemorrhage. No mass lesion, midline shift or mass effect. No hydrocephalus. No extra-axial fluid collection. Major dural sinuses grossly patent. Incidental note made of a partially empty sella. Midline structures intact and normal. Major intracranial vascular flow voids maintained. Craniocervical junction normal. Upper cervical spine within normal limits. Bone marrow signal intensity within normal limits. No scalp soft tissue abnormality. Globes and orbital soft tissues within normal limits. Patient status post cataract extraction bilaterally. Paranasal sinuses are clear. No mastoid effusion. Inner ear structures normal. MRA HEAD FINDINGS ANTERIOR CIRCULATION: Distal cervical segments of the internal carotid arteries are patent with antegrade flow. Petrous segments widely patent bilaterally. Mild atheromatous irregularity within the cavernous/ supraclinoid ICAs without flow-limiting stenosis. ICA termini widely patent. A1 segments widely patent. Normal anterior communicating artery. Anterior cerebral arteries patent to their distal aspects without stenosis. M1 segments patent without stenosis. Normal MCA bifurcations. No proximal M2 occlusion. Mild distal small vessel atheromatous irregularity within the  MCA branches bilaterally. Stenosis. POSTERIOR CIRCULATION: Dominant left vertebral artery widely patent to the vertebrobasilar junction. Right vertebral artery diminutive and largely terminates in PICA, although a tiny branch AA sense towards the vertebrobasilar junction. Posterior inferior cerebral arteries patent bilaterally. Basilar artery widely patent to its distal aspect. Superior cerebral arteries patent bilaterally. Both of the posterior cerebral artery supplied via the basilar. Atheromatous irregularity within the P2 segments bilaterally, left greater than right. Mild to moderate left P2 stenoses. No high-grade flow-limiting stenosis. Small vessel atheromatous irregularity within the distal PCA branches. No aneurysm. MRA NECK FINDINGS Visualized aortic arch normal in caliber with normal branch pattern. No flow-limiting stenosis about the origin of the great vessels. Visualized subclavian artery is widely patent. Right common and internal carotid artery is widely patent to the circle of Willis without stenosis or occlusion. Right ICA mildly tortuous. No significant atheromatous narrowing about the right carotid bifurcation. Left common and internal carotid artery is widely patent to the circle of Willis without stenosis or occlusion. Left ICA mildly tortuous. No significant atheromatous narrowing about the left carotid bifurcation. Both of the vertebral arteries arise from the subclavian arteries. Left vertebral artery dominant. Right vertebral artery diffusely hypoplastic. Vertebral arteries widely patent within the neck without flow-limiting stenosis or occlusion. IMPRESSION: MRI HEAD IMPRESSION: Normal MRI of the brain for patient age. No acute intracranial abnormality identified. MRA HEAD IMPRESSION: 1. Negative intracranial MRA for large or proximal arterial branch occlusion. 2. Minor atheromatous changes involving the intracranial circulation as above, most notable within the  PCAs bilaterally. No  high-grade or correctable stenosis. MRA NECK IMPRESSION: Normal MRA of the neck. Electronically Signed   By: Rise Mu M.D.   On: 10/08/2017 03:51   Mr Brain Wo Contrast  Result Date: 10/08/2017 CLINICAL DATA:  Initial evaluation for acute headache for past 2 weeks, elevated blood pressure. EXAM: MRI HEAD WITHOUT CONTRAST MRA HEAD WITHOUT CONTRAST MRA NECK WITHOUT AND WITH CONTRAST TECHNIQUE: Multiplanar, multiecho pulse sequences of the brain and surrounding structures were obtained without intravenous contrast. Angiographic images of the Circle of Willis were obtained using MRA technique without intravenous contrast. Angiographic images of the neck were obtained using MRA technique without and with intravenous contrast. Carotid stenosis measurements (when applicable) are obtained utilizing NASCET criteria, using the distal internal carotid diameter as the denominator. CONTRAST:  16mL MULTIHANCE GADOBENATE DIMEGLUMINE 529 MG/ML IV SOLN COMPARISON:  Previous MRI from 08/22/2012. FINDINGS: MRI HEAD FINDINGS Generalized age appropriate cerebral atrophy. Mild confluent T2/FLAIR hyperintensity within the periventricular white matter, most like related to minimal chronic small vessel ischemic changes, also felt to be within normal limits for age. No abnormal foci of restricted diffusion to suggest acute or subacute ischemia. Gray-white matter differentiation well maintained. No encephalomalacia to suggest chronic infarction. No foci susceptibility artifact to suggest acute or chronic intracranial hemorrhage. No mass lesion, midline shift or mass effect. No hydrocephalus. No extra-axial fluid collection. Major dural sinuses grossly patent. Incidental note made of a partially empty sella. Midline structures intact and normal. Major intracranial vascular flow voids maintained. Craniocervical junction normal. Upper cervical spine within normal limits. Bone marrow signal intensity within normal limits. No  scalp soft tissue abnormality. Globes and orbital soft tissues within normal limits. Patient status post cataract extraction bilaterally. Paranasal sinuses are clear. No mastoid effusion. Inner ear structures normal. MRA HEAD FINDINGS ANTERIOR CIRCULATION: Distal cervical segments of the internal carotid arteries are patent with antegrade flow. Petrous segments widely patent bilaterally. Mild atheromatous irregularity within the cavernous/ supraclinoid ICAs without flow-limiting stenosis. ICA termini widely patent. A1 segments widely patent. Normal anterior communicating artery. Anterior cerebral arteries patent to their distal aspects without stenosis. M1 segments patent without stenosis. Normal MCA bifurcations. No proximal M2 occlusion. Mild distal small vessel atheromatous irregularity within the MCA branches bilaterally. Stenosis. POSTERIOR CIRCULATION: Dominant left vertebral artery widely patent to the vertebrobasilar junction. Right vertebral artery diminutive and largely terminates in PICA, although a tiny branch AA sense towards the vertebrobasilar junction. Posterior inferior cerebral arteries patent bilaterally. Basilar artery widely patent to its distal aspect. Superior cerebral arteries patent bilaterally. Both of the posterior cerebral artery supplied via the basilar. Atheromatous irregularity within the P2 segments bilaterally, left greater than right. Mild to moderate left P2 stenoses. No high-grade flow-limiting stenosis. Small vessel atheromatous irregularity within the distal PCA branches. No aneurysm. MRA NECK FINDINGS Visualized aortic arch normal in caliber with normal branch pattern. No flow-limiting stenosis about the origin of the great vessels. Visualized subclavian artery is widely patent. Right common and internal carotid artery is widely patent to the circle of Willis without stenosis or occlusion. Right ICA mildly tortuous. No significant atheromatous narrowing about the right carotid  bifurcation. Left common and internal carotid artery is widely patent to the circle of Willis without stenosis or occlusion. Left ICA mildly tortuous. No significant atheromatous narrowing about the left carotid bifurcation. Both of the vertebral arteries arise from the subclavian arteries. Left vertebral artery dominant. Right vertebral artery diffusely hypoplastic. Vertebral arteries widely patent within the neck without flow-limiting stenosis  or occlusion. IMPRESSION: MRI HEAD IMPRESSION: Normal MRI of the brain for patient age. No acute intracranial abnormality identified. MRA HEAD IMPRESSION: 1. Negative intracranial MRA for large or proximal arterial branch occlusion. 2. Minor atheromatous changes involving the intracranial circulation as above, most notable within the PCAs bilaterally. No high-grade or correctable stenosis. MRA NECK IMPRESSION: Normal MRA of the neck. Electronically Signed   By: Rise MuBenjamin  McClintock M.D.   On: 10/08/2017 03:51    ________ Procedures   ____________________________________________   INITIAL IMPRESSION / ASSESSMENT AND PLAN / ED COURSE  As part of my medical decision making, I reviewed the following data within the electronic MEDICAL RECORD [id number] year old female presented with above-stated history of hypertension headache with posterior neck pain and as such consider the possibility of the posterior cerebral circulation abnormality.  As such MR angiogram MRI head was performed which revealed no acute abnormality.  Half of inch of Nitropaste applied to the patient's chest with market blood pressure improvement from 203/76 to 160/77 before discharge.  Reviewed the patient's antihypertensive management which is lisinopril HCTZ.will increase patient's lisinopril from 20-30 mg.  Patient advised to keep a log of her blood pressures at home and follow-up with primary care provider for further outpatient management    ____________________________________________  FINAL  CLINICAL IMPRESSION(S) / ED DIAGNOSES  Final diagnoses:  Essential hypertension     MEDICATIONS GIVEN DURING THIS VISIT:  Medications  nitroGLYCERIN (NITROGLYN) 2 % ointment 0.5 inch (0.5 inches Topical Given 10/07/17 2355)  gadobenate dimeglumine (MULTIHANCE) injection 20 mL (16 mLs Intravenous Contrast Given 10/08/17 0155)     ED Discharge Orders        Ordered    lisinopril (PRINIVIL,ZESTRIL) 30 MG tablet  Daily     10/08/17 0357       Note:  This document was prepared using Dragon voice recognition software and may include unintentional dictation errors.    Darci CurrentBrown, [place], MD 10/09/17 [phone]

## 2017-10-08 ENCOUNTER — Emergency Department: Payer: Medicare HMO

## 2017-10-08 MED ORDER — LISINOPRIL 30 MG PO TABS: 30.0000 mg | ORAL_TABLET | Freq: Every day | ORAL | 0 refills | Status: DC

## 2017-10-08 MED ORDER — GADOBENATE DIMEGLUMINE 529 MG/ML IV SOLN
20.0000 mL | Freq: Once | INTRAVENOUS | Status: AC | PRN
  Administered 2017-10-08: 16 mL via INTRAVENOUS

## 2017-10-08 NOTE — ED Notes (Addendum)
Patient transported to MRI 

## 2018-09-26 ENCOUNTER — Encounter: Payer: Self-pay | Admitting: Urology

## 2018-09-26 ENCOUNTER — Ambulatory Visit: Payer: Medicare HMO | Admitting: Urology

## 2018-09-26 VITALS — BP 131/76 | HR 73 | Ht 62.0 in | Wt 169.0 lb

## 2018-09-26 DIAGNOSIS — N3281 Overactive bladder: Secondary | ICD-10-CM

## 2018-09-26 DIAGNOSIS — N390 Urinary tract infection, site not specified: Secondary | ICD-10-CM | POA: Diagnosis not present

## 2018-09-26 DIAGNOSIS — N3941 Urge incontinence: Secondary | ICD-10-CM | POA: Diagnosis not present

## 2018-09-26 LAB — MICROSCOPIC EXAMINATION

## 2018-09-26 LAB — URINALYSIS, COMPLETE
Bilirubin, UA: NEGATIVE
GLUCOSE, UA: NEGATIVE
NITRITE UA: NEGATIVE
PH UA: 7 (ref 5.0–7.5)
Specific Gravity, UA: 1.015 (ref 1.005–1.030)
UUROB: 0.2 mg/dL (ref 0.2–1.0)

## 2018-09-26 LAB — BLADDER SCAN AMB NON-IMAGING

## 2018-09-26 NOTE — Progress Notes (Signed)
09/26/2018 3:06 PM   [address] [birth year] [id number]  Referring provider: Alan Mulder, MD [address] [place], [place] [address]  Chief Complaint  Patient presents with  . Establish Care    HPI: 84-year-old female presents to establish local urologic care.  She has a long history of overactive bladder with urge incontinence and has previously been followed by Dr. Achilles Dunk.  She has a history of recurrent UTIs.  She last saw Dr. Achilles Dunk in April 2019 and was advised to follow-up in 1 year.  She is taking low-dose vaginal estrogen but on no other medications for her lower urinary tract symptoms.  She does wear pads and prefers this to medications for her symptoms.  She denies dysuria or gross hematuria.  She denies flank, abdominal or pelvic pain.   PMH: Past Medical History:  Diagnosis Date  . Arthritis    spine  . Cervicalgia   . Chronic cystitis   . Deafness in left ear    s/p skull fracture - age 23  . GERD (gastroesophageal reflux disease)   . Headache    daily. s/p skull fracture as 84 yr old  . 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 Hypertension   . Hypothyroidism   . Mixed incontinence urge and stress   . Vertigo   . Wears hearing aid    right ear    Surgical History: Past Surgical History:  Procedure Laterality Date  . ABDOMINAL HYSTERECTOMY    . CATARACT EXTRACTION W/ INTRAOCULAR LENS IMPLANT    . CHOLECYSTECTOMY    . COLONOSCOPY    . EYE SURGERY Bilateral    eye shunts  . FOOT SURGERY    . GLAUCOMA SURGERY    . PHOTOCOAGULATION WITH LASER Left 09/07/2016   Procedure: PHOTOCOAGULATION WITH LASER;  Surgeon: Sherald Hess, MD;  Location: [facility];  Service: Ophthalmology;  Laterality: Left;  LEFT  . TONSILLECTOMY      Home Medications:  Allergies as of 09/26/2018      Reactions   Contrast Media [iodinated Diagnostic Agents] Shortness Of Breath   Iodine Shortness Of Breath   Sulfasalazine Itching, Swelling   Dye  Fdc Red [red Dye]    Lactose Intolerance (gi)    GI upset   Penicillins    Childhood event-unknown reaction   Shellfish Allergy Nausea Only, Swelling   Throat swelling   Sulfa Antibiotics Itching, Swelling   Venlafaxine Swelling   Throat   Codeine Palpitations   Ropinirole Nausea Only      Medication List        Accurate as of 09/26/18  3:06 PM. Always use your most recent med list.          ALLEGRA ALLERGY PO Take by mouth.   ARTIFICIAL TEAR OP Apply to eye.   aspirin 81 MG tablet Take 81 mg by mouth daily.   b complex vitamins tablet Take by mouth.   bimatoprost 0.01 % Soln Commonly known as:  LUMIGAN 1 drop at bedtime.   Calcium Carbonate-Vitamin D 600-400 MG-UNIT tablet Take by mouth.   diclofenac sodium 1 % Gel Commonly known as:  VOLTAREN Apply topically.   FLUoxetine 10 MG tablet Commonly known as:  PROZAC   furosemide 20 MG tablet Commonly known as:  LASIX   levothyroxine 50 MCG tablet Commonly known as:  SYNTHROID, LEVOTHROID Take 50 mcg by mouth daily before breakfast.   lisinopril 20 MG tablet Commonly known  as:  PRINIVIL,ZESTRIL   MAGNESIUM CARBONATE PO Take by mouth.   meclizine 25 MG tablet Commonly known as:  ANTIVERT Take by mouth.   metoprolol succinate 25 MG 24 hr tablet Commonly known as:  TOPROL-XL   nebivolol 5 MG tablet Commonly known as:  BYSTOLIC Take 5 mg by mouth daily.   omeprazole 20 MG capsule Commonly known as:  PRILOSEC Take 20 mg by mouth daily.   PREMARIN vaginal cream Generic drug:  conjugated estrogens once a week.   VYZULTA 0.024 % Soln Generic drug:  Latanoprostene Bunod       Allergies:  Allergies  Allergen Reactions  . Contrast Media [Iodinated Diagnostic Agents] Shortness Of Breath  . Iodine Shortness Of Breath  . Sulfasalazine Itching and Swelling  . Dye Fdc Red [Red Dye]   . Lactose Intolerance (Gi)     GI upset  . Penicillins     Childhood event-unknown reaction  . Shellfish  Allergy Nausea Only and Swelling    Throat swelling  . Sulfa Antibiotics Itching and Swelling  . Venlafaxine Swelling    Throat  . Codeine Palpitations  . Ropinirole Nausea Only    Family History: No family history on file.  Social History:  reports that she has never smoked. She has never used smokeless tobacco. She reports that she does not drink alcohol or use drugs.  ROS: UROLOGY Frequent Urination?: Yes Hard to postpone urination?: Yes Burning/pain with urination?: Yes Get up at night to urinate?: Yes Leakage of urine?: Yes Urine stream starts and stops?: Yes Trouble starting stream?: Yes Do you have to strain to urinate?: Yes Blood in urine?: No Urinary tract infection?: No Sexually transmitted disease?: No Injury to kidneys or bladder?: No Painful intercourse?: No Weak stream?: Yes Currently pregnant?: No Vaginal bleeding?: No Last menstrual period?: Hysterectomy  Gastrointestinal Nausea?: No Vomiting?: No Indigestion/heartburn?: No Diarrhea?: Yes Constipation?: Yes  Constitutional Fever: No Night sweats?: No Weight loss?: No Fatigue?: Yes  Skin Skin rash/lesions?: No Itching?: Yes  Eyes Blurred vision?: Yes Double vision?: No  Ears/Nose/Throat Sore throat?: No Sinus problems?: Yes  Hematologic/Lymphatic Swollen glands?: No Easy bruising?: Yes  Cardiovascular Leg swelling?: No Chest pain?: No  Respiratory Cough?: No Shortness of breath?: No  Endocrine Excessive thirst?: No  Musculoskeletal Back pain?: No Joint pain?: Yes  Neurological Headaches?: Yes Dizziness?: Yes  Psychologic Depression?: No Anxiety?: Yes  Physical Exam: BP 131/76 (BP Location: Left Arm, Patient Position: Sitting, Cuff Size: Large)   Pulse 73   Ht 5\' 2"  (1.575 m)   Wt 169 lb (76.7 kg)   BMI 30.91 kg/m   Constitutional:  Alert and oriented, No acute distress. HEENT: [place] AT, moist mucus membranes.  Trachea midline, no masses. Cardiovascular: No  clubbing, cyanosis, or edema. Respiratory: Normal respiratory effort, no increased work of breathing. GI: Abdomen is soft, nontender, nondistended, no abdominal masses GU: No CVA tenderness Lymph: No cervical or inguinal lymphadenopathy. Skin: No rashes, bruises or suspicious lesions. Neurologic: Grossly intact, no focal deficits, moving all 4 extremities. Psychiatric: Normal mood and affect.    Assessment & Plan:   84 year old female with overactive bladder and urge incontinence which is stable.  She is currently satisfied with her voiding pattern and does not desire any further intervention.  PVR by bladder scan today was 25 mL.  Follow-up 1 year and she was instructed to call earlier for any significant change in her symptoms.  She was initially unable to void on arrival to clinic as she  stated she voided prior to leaving home.  She was subsequently able to void prior to her bladder scan and asked that specimen be sent because she was having mild dysuria.  The specimen showed 11-30 WBCs and no RBCs.  There was not enough urine to send for culture and she was given a specimen cup and will bring one in later today or tomorrow.  Return in about 1 year (around 09/27/2019) for Recheck.   Riki Altes, MD  [facility] [address], [address] [place], [place] [address] [phone]

## 2018-09-27 ENCOUNTER — Other Ambulatory Visit: Payer: Medicare HMO

## 2018-09-27 DIAGNOSIS — N390 Urinary tract infection, site not specified: Secondary | ICD-10-CM

## 2018-09-30 LAB — CULTURE, URINE COMPREHENSIVE

## 2018-10-03 ENCOUNTER — Other Ambulatory Visit: Payer: Self-pay | Admitting: Urology

## 2018-10-03 MED ORDER — NITROFURANTOIN MONOHYD MACRO 100 MG PO CAPS: 100.0000 mg | ORAL_CAPSULE | Freq: Two times a day (BID) | ORAL | 0 refills | Status: AC

## 2018-10-04 ENCOUNTER — Telehealth: Payer: Self-pay

## 2018-10-04 NOTE — Telephone Encounter (Signed)
-----   Message from Riki Altes, MD sent at 10/03/2018  6:52 PM EST ----- Urine culture was positive.  If still symptomatic antibiotic Macrobid was sent to pharmacy.

## 2018-10-04 NOTE — Telephone Encounter (Signed)
Called pt no answer. LM for pt informing her of the information below. Advised pt to call back for questions or concerns.  °

## 2019-09-29 ENCOUNTER — Ambulatory Visit: Payer: Medicare HMO | Admitting: Urology

## 2019-10-04 ENCOUNTER — Encounter: Payer: Self-pay | Admitting: Urology

## 2019-10-04 ENCOUNTER — Other Ambulatory Visit: Payer: Self-pay

## 2019-10-04 ENCOUNTER — Ambulatory Visit: Payer: Medicare HMO | Admitting: Urology

## 2019-10-04 VITALS — BP 166/78 | HR 62 | Ht 62.0 in | Wt 172.0 lb

## 2019-10-04 DIAGNOSIS — N3281 Overactive bladder: Secondary | ICD-10-CM | POA: Diagnosis not present

## 2019-10-04 LAB — URINALYSIS, COMPLETE
Bilirubin, UA: NEGATIVE
Glucose, UA: NEGATIVE
Ketones, UA: NEGATIVE
Leukocytes,UA: NEGATIVE
Nitrite, UA: NEGATIVE
Protein,UA: NEGATIVE
RBC, UA: NEGATIVE
Specific Gravity, UA: 1.02 (ref 1.005–1.030)
Urobilinogen, Ur: 0.2 mg/dL (ref 0.2–1.0)
pH, UA: 7 (ref 5.0–7.5)

## 2019-10-04 LAB — MICROSCOPIC EXAMINATION
Bacteria, UA: NONE SEEN
RBC: NONE SEEN /hpf (ref 0–2)

## 2019-10-04 NOTE — Progress Notes (Signed)
10/04/2019 1:21 PM   [address] 27-Nov-1930 [id number]  Referring provider: Alan Mulder, MD [address] [place],  [place] [address]  Chief Complaint  Patient presents with  . Follow-up    Urologic history: 1.  Recurrent UTI -Low-dose vaginal estrogen  2.  Overactive bladder with urge incontinence -Has opted for no management  HPI: 85 y.o. female presents for annual follow-up.  Since her visit last year she states she was treated for 2 infections by her PCP.  She has had several surgeries for glaucoma and states she has not been able to afford the Premarin cream.  She has stable lower urinary tract symptoms.  Denies dysuria or gross hematuria.  She has mild bladder pressure.   PMH: Past Medical History:  Diagnosis Date  . Arthritis    spine  . Cervicalgia   . Chronic cystitis   . Deafness in left ear    s/p skull fracture - age 85  . GERD (gastroesophageal reflux disease)   . Headache    daily. s/p skull fracture as 85 yr old  . 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 Hypertension   . Hypothyroidism   . Mixed incontinence urge and stress   . Vertigo   . Wears hearing aid    right ear    Surgical History: Past Surgical History:  Procedure Laterality Date  . ABDOMINAL HYSTERECTOMY    . CATARACT EXTRACTION W/ INTRAOCULAR LENS IMPLANT    . CHOLECYSTECTOMY    . COLONOSCOPY    . EYE SURGERY Bilateral    eye shunts  . FOOT SURGERY    . GLAUCOMA SURGERY    . PHOTOCOAGULATION WITH LASER Left 09/07/2016   Procedure: PHOTOCOAGULATION WITH LASER;  Surgeon: Sherald Hess, MD;  Location: [facility];  Service: Ophthalmology;  Laterality: Left;  LEFT  . TONSILLECTOMY      Home Medications:  Allergies as of 10/04/2019      Reactions   Contrast Media [iodinated Diagnostic Agents] Shortness Of Breath   Iodine Shortness Of Breath   Sulfasalazine Itching, Swelling   Dye Fdc Red [red Dye]    Lactose Intolerance (gi)    GI upset    Penicillins    Childhood event-unknown reaction   Shellfish Allergy Nausea Only, Swelling   Throat swelling   Sulfa Antibiotics Itching, Swelling   Venlafaxine Swelling   Throat   Codeine Palpitations   Ropinirole Nausea Only      Medication List       Accurate as of October 04, 2019  1:21 PM. If you have any questions, ask your nurse or doctor.        STOP taking these medications   meclizine 25 MG tablet Commonly known as: ANTIVERT Stopped by: Riki Altes, MD   nebivolol 5 MG tablet Commonly known as: BYSTOLIC Stopped by: Riki Altes, MD   omeprazole 20 MG capsule Commonly known as: PRILOSEC Stopped by: Riki Altes, MD     TAKE these medications   ALLEGRA ALLERGY PO Take by mouth.   ARTIFICIAL TEAR OP Apply to eye.   aspirin 81 MG tablet Take 81 mg by mouth daily.   b complex vitamins tablet Take by mouth.   bimatoprost 0.01 % Soln Commonly known as: LUMIGAN 1 drop at bedtime.   Calcium Carbonate-Vitamin D 600-400 MG-UNIT tablet Take by mouth.   FLUoxetine 10 MG tablet Commonly known as: PROZAC   furosemide 20  MG tablet Commonly known as: LASIX   levothyroxine 50 MCG tablet Commonly known as: SYNTHROID Take 50 mcg by mouth daily before breakfast.   lisinopril 20 MG tablet Commonly known as: ZESTRIL   MAGNESIUM CARBONATE PO Take by mouth.   metoprolol succinate 25 MG 24 hr tablet Commonly known as: TOPROL-XL   Premarin vaginal cream Generic drug: conjugated estrogens once a week.   Vyzulta 0.024 % Soln Generic drug: Latanoprostene Bunod       Allergies:  Allergies  Allergen Reactions  . Contrast Media [Iodinated Diagnostic Agents] Shortness Of Breath  . Iodine Shortness Of Breath  . Sulfasalazine Itching and Swelling  . Dye Fdc Red [Red Dye]   . Lactose Intolerance (Gi)     GI upset  . Penicillins     Childhood event-unknown reaction  . Shellfish Allergy Nausea Only and Swelling    Throat swelling  . Sulfa  Antibiotics Itching and Swelling  . Venlafaxine Swelling    Throat  . Codeine Palpitations  . Ropinirole Nausea Only    Family History: No family history on file.  Social History:  reports that she has never smoked. She has never used smokeless tobacco. She reports that she does not drink alcohol or use drugs.  ROS: UROLOGY Frequent Urination?: No Hard to postpone urination?: No Burning/pain with urination?: No Get up at night to urinate?: No Leakage of urine?: No Urine stream starts and stops?: No Trouble starting stream?: No Do you have to strain to urinate?: No Blood in urine?: No Urinary tract infection?: No Sexually transmitted disease?: No Injury to kidneys or bladder?: No Painful intercourse?: No Weak stream?: No Currently pregnant?: No Vaginal bleeding?: No Last menstrual period?: n  Gastrointestinal Nausea?: No Vomiting?: No Indigestion/heartburn?: No Diarrhea?: No Constipation?: No  Constitutional Fever: No Night sweats?: No Weight loss?: No Fatigue?: No  Skin Skin rash/lesions?: No Itching?: No  Eyes Blurred vision?: No Double vision?: No  Ears/Nose/Throat Sore throat?: No Sinus problems?: No  Hematologic/Lymphatic Swollen glands?: No Easy bruising?: No  Cardiovascular Leg swelling?: No Chest pain?: No  Respiratory Cough?: No Shortness of breath?: No  Endocrine Excessive thirst?: No  Musculoskeletal Back pain?: No Joint pain?: No  Neurological Headaches?: No Dizziness?: No  Psychologic Depression?: No Anxiety?: No  Physical Exam: BP (!) 166/78   Pulse 62   Ht 5\' 2"  (1.575 m)   Wt 172 lb (78 kg)   BMI 31.46 kg/m   Constitutional:  Alert and oriented, No acute distress. HEENT: [place] AT, moist mucus membranes.  Trachea midline, no masses. Cardiovascular: No clubbing, cyanosis, or edema. Respiratory: Normal respiratory effort, no increased work of breathing.   Assessment & Plan:    - Overactive bladder Stable  symptoms which are not bothersome  - Recurrent UTI She was given Premarin samples.  We also discussed recommendation of D-mannose 1 g twice daily as a supplement for UTI prevention.  Urinalysis today was clear  Continue annual follow-up.    No follow-ups on file.  Abbie Sons, [place] [address], [place], [place] [address] [phone]

## 2019-12-22 ENCOUNTER — Other Ambulatory Visit: Payer: Self-pay

## 2019-12-22 ENCOUNTER — Emergency Department: Payer: Medicare HMO

## 2019-12-22 ENCOUNTER — Emergency Department
Admission: EM | Admit: 2019-12-22 | Discharge: 2019-12-22 | Disposition: A | Discharge status: Home / Self Care | Payer: Medicare HMO | Attending: Emergency Medicine | Admitting: Emergency Medicine

## 2019-12-22 DIAGNOSIS — L03115 Cellulitis of right lower limb: Secondary | ICD-10-CM

## 2019-12-22 DIAGNOSIS — E039 Hypothyroidism, unspecified: Secondary | ICD-10-CM | POA: Diagnosis not present

## 2019-12-22 DIAGNOSIS — I10 Essential (primary) hypertension: Secondary | ICD-10-CM | POA: Insufficient documentation

## 2019-12-22 DIAGNOSIS — R531 Weakness: Secondary | ICD-10-CM | POA: Diagnosis present

## 2019-12-22 DIAGNOSIS — Z20822 Contact with and (suspected) exposure to covid-19: Secondary | ICD-10-CM | POA: Insufficient documentation

## 2019-12-22 LAB — CBC WITH DIFFERENTIAL/PLATELET
Abs Immature Granulocytes: 0.05 10*3/uL (ref 0.00–0.07)
Basophils Absolute: 0.1 10*3/uL (ref 0.0–0.1)
Basophils Relative: 1 %
Eosinophils Absolute: 0 10*3/uL (ref 0.0–0.5)
Eosinophils Relative: 0 %
HCT: 35.1 % — ABNORMAL LOW (ref 36.0–46.0)
Hemoglobin: 11.3 g/dL — ABNORMAL LOW (ref 12.0–15.0)
Immature Granulocytes: 0 %
Lymphocytes Relative: 19 %
Lymphs Abs: 2.1 10*3/uL (ref 0.7–4.0)
MCH: 27.7 pg (ref 26.0–34.0)
MCHC: 32.2 g/dL (ref 30.0–36.0)
MCV: 86 fL (ref 80.0–100.0)
Monocytes Absolute: 1.1 10*3/uL — ABNORMAL HIGH (ref 0.1–1.0)
Monocytes Relative: 10 %
Neutro Abs: 7.9 10*3/uL — ABNORMAL HIGH (ref 1.7–7.7)
Neutrophils Relative %: 70 %
Platelets: 323 10*3/uL (ref 150–400)
RBC: 4.08 MIL/uL (ref 3.87–5.11)
RDW: 14.3 % (ref 11.5–15.5)
WBC: 11.3 10*3/uL — ABNORMAL HIGH (ref 4.0–10.5)
nRBC: 0 % (ref 0.0–0.2)

## 2019-12-22 LAB — RESPIRATORY PANEL BY RT PCR (FLU A&B, COVID)
Influenza A by PCR: NEGATIVE
Influenza B by PCR: NEGATIVE
SARS Coronavirus 2 by RT PCR: NEGATIVE

## 2019-12-22 LAB — COMPREHENSIVE METABOLIC PANEL
ALT: 13 U/L (ref 0–44)
AST: 23 U/L (ref 15–41)
Albumin: 3.2 g/dL — ABNORMAL LOW (ref 3.5–5.0)
Alkaline Phosphatase: 64 U/L (ref 38–126)
Anion gap: 10 (ref 5–15)
BUN: 9 mg/dL (ref 8–23)
CO2: 24 mmol/L (ref 22–32)
Calcium: 8.7 mg/dL — ABNORMAL LOW (ref 8.9–10.3)
Chloride: 95 mmol/L — ABNORMAL LOW (ref 98–111)
Creatinine, Ser: 0.88 mg/dL (ref 0.44–1.00)
GFR calc Af Amer: >60 mL/min (ref >60)
GFR calc non Af Amer: 59 mL/min — ABNORMAL LOW (ref >60)
Glucose, Bld: 113 mg/dL — ABNORMAL HIGH (ref 70–99)
Potassium: 4 mmol/L (ref 3.5–5.1)
Sodium: 129 mmol/L — ABNORMAL LOW (ref 135–145)
Total Bilirubin: 1.2 mg/dL (ref 0.3–1.2)
Total Protein: 6.6 g/dL (ref 6.5–8.1)

## 2019-12-22 LAB — POC SARS CORONAVIRUS 2 AG: SARS Coronavirus 2 Ag: NEGATIVE

## 2019-12-22 LAB — LACTIC ACID, PLASMA: Lactic Acid, Venous: 1.4 mmol/L (ref 0.5–1.9)

## 2019-12-22 MED ORDER — SODIUM CHLORIDE 0.9 % IV BOLUS
500.0000 mL | Freq: Once | INTRAVENOUS | Status: AC
  Administered 2019-12-22: 500 mL via INTRAVENOUS

## 2019-12-22 MED ORDER — CEPHALEXIN 500 MG PO CAPS: 500.0000 mg | ORAL_CAPSULE | Freq: Four times a day (QID) | ORAL | 0 refills | Status: AC

## 2019-12-22 MED ORDER — ONDANSETRON HCL 4 MG PO TABS: 4.0000 mg | ORAL_TABLET | Freq: Every day | ORAL | 1 refills | Status: DC | PRN

## 2019-12-22 MED ORDER — VANCOMYCIN HCL IN DEXTROSE 1-5 GM/200ML-% IV SOLN
1000.0000 mg | Freq: Once | INTRAVENOUS | Status: AC
  Administered 2019-12-22: 18:00:00 1000 mg via INTRAVENOUS
  Filled 2019-12-22: qty 200

## 2019-12-22 MED ORDER — ACETAMINOPHEN 500 MG PO TABS
1000.0000 mg | ORAL_TABLET | Freq: Once | ORAL | Status: AC
  Administered 2019-12-22: 1000 mg via ORAL
  Filled 2019-12-22: qty 2

## 2019-12-22 NOTE — ED Notes (Signed)
IV team at bedside 

## 2019-12-22 NOTE — ED Notes (Signed)
Pt stuck multiple times with no success with IV. MD informed and IV team order placed at this time.

## 2019-12-22 NOTE — ED Triage Notes (Addendum)
Pt comes via [facility] from home with c/o left lower foot cellulitis. Pt has redness and warmth noted to left top of foot.  Pt also states she recently got her 1st COVID vaccine and not feeling too well.   Pt c/o headache and little nauseated. Pt states a little off balance. Pt reports feeling weak.  EMS reports VSS,

## 2019-12-22 NOTE — ED Provider Notes (Signed)
[facility] Emergency Department Provider Note       Time seen: ----------------------------------------- 3:43 PM on 12/22/2019 -----------------------------------------   I have reviewed the triage vital signs and the nursing notes.  HISTORY  Chief Complaint cellulistis    HPI Pamela Reed is a 86 y.o. female with a history of arthritis, GERD, headache, hypertension, hypothyroidism who presents to the ED for possible right foot cellulitis.  Patient has redness and warmth noted to the top of the right foot.  Patient states is so painful she has difficulty walking.  She reports recent Covid vaccine and not feeling well, has headache and nausea.  Discomfort is 5 out of 10.  Past Medical History:  Diagnosis Date  . Arthritis    spine  . Cervicalgia   . Chronic cystitis   . Deafness in left ear    s/p skull fracture - age 22  . GERD (gastroesophageal reflux disease)   . Headache    daily. s/p skull fracture as 86 yr old  . 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 Hypertension   . Hypothyroidism   . Mixed incontinence urge and stress   . Vertigo   . Wears hearing aid    right ear    There are no problems to display for this patient.   Past Surgical History:  Procedure Laterality Date  . ABDOMINAL HYSTERECTOMY    . CATARACT EXTRACTION W/ INTRAOCULAR LENS IMPLANT    . CHOLECYSTECTOMY    . COLONOSCOPY    . EYE SURGERY Bilateral    eye shunts  . FOOT SURGERY    . GLAUCOMA SURGERY    . PHOTOCOAGULATION WITH LASER Left 09/07/2016   Procedure: PHOTOCOAGULATION WITH LASER;  Surgeon: Sherald Hess, MD;  Location: [facility];  Service: Ophthalmology;  Laterality: Left;  LEFT  . TONSILLECTOMY      Allergies Contrast media [iodinated diagnostic agents], Iodine, Sulfasalazine, Dye fdc red [red dye], Lactose intolerance (gi), Penicillins, Shellfish allergy, Sulfa antibiotics, Venlafaxine, Codeine, and Ropinirole  Social  History Social History   Tobacco Use  . Smoking status: Never Smoker  . Smokeless tobacco: Never Used  Substance Use Topics  . Alcohol use: No  . Drug use: No   Review of Systems Constitutional: Negative for fever. Cardiovascular: Negative for chest pain. Respiratory: Negative for shortness of breath. Gastrointestinal: Negative for abdominal pain, vomiting and diarrhea. Musculoskeletal: Positive for right foot pain and swelling Skin: Positive right foot erythema Neurological: Negative for headaches, focal weakness or numbness.  All systems negative/normal/unremarkable except as stated in the HPI  ____________________________________________   PHYSICAL EXAM:  VITAL SIGNS: ED Triage Vitals  Enc Vitals Group     BP      Pulse      Resp      Temp      Temp src      SpO2      Weight      Height      Head Circumference      Peak Flow      Pain Score      Pain Loc      Pain Edu?      Excl. in GC?    Constitutional: Alert and oriented. Well appearing and in no distress. Eyes: Conjunctivae are normal. Normal extraocular movements. ENT      Head: Normocephalic and atraumatic.      Nose: No congestion/rhinnorhea.      Mouth/Throat:  Mucous membranes are moist.      Neck: No stridor. Cardiovascular: Normal rate, regular rhythm. No murmurs, rubs, or gallops. Respiratory: Normal respiratory effort without tachypnea nor retractions. Breath sounds are clear and equal bilaterally. No wheezes/rales/rhonchi. Gastrointestinal: Soft and nontender. Normal bowel sounds Musculoskeletal: Pain and swelling to the dorsum of the right foot with erythema. Neurologic:  Normal speech and language. No gross focal neurologic deficits are appreciated.  Skin: Dorsal lateral right foot redness, swelling and tenderness Psychiatric: Mood and affect are normal. Speech and behavior are normal.  ____________________________________________  ED COURSE:  As part of my medical decision making, I  reviewed the following data within the [facility] History obtained from family if available, nursing notes, old chart and ekg, as well as notes from prior ED visits. Patient presented for right foot pain and swelling, we will assess with labs and imaging as indicated at this time.   Procedures  ORIAN Reed was evaluated in Emergency Department on 12/22/2019 for the symptoms described in the history of present illness. She was evaluated in the context of the global COVID-19 pandemic, which necessitated consideration that the patient might be at risk for infection with the SARS-CoV-2 virus that causes COVID-19. Institutional protocols and algorithms that pertain to the evaluation of patients at risk for COVID-19 are in a state of rapid change based on information released by regulatory bodies including the CDC and federal and state organizations. These policies and algorithms were followed during the patient's care in the ED.   EKG: Interpreted by me, sinus rhythm with a rate of 76 bpm, normal PR interval, normal QRS, normal QT ____________________________________________   LABS (pertinent positives/negatives)  Labs Reviewed  COMPREHENSIVE METABOLIC PANEL - Abnormal; Notable for the following components:      Result Value   Sodium 129 (*)    Chloride 95 (*)    Glucose, Bld 113 (*)    Calcium 8.7 (*)    Albumin 3.2 (*)    GFR calc non Af Amer 59 (*)    All other components within normal limits  CBC WITH DIFFERENTIAL/PLATELET - Abnormal; Notable for the following components:   WBC 11.3 (*)    Hemoglobin 11.3 (*)    HCT 35.1 (*)    Neutro Abs 7.9 (*)    Monocytes Absolute 1.1 (*)    All other components within normal limits  CULTURE, BLOOD (ROUTINE X 2)  CULTURE, BLOOD (ROUTINE X 2)  RESPIRATORY PANEL BY RT PCR (FLU A&B, COVID)  LACTIC ACID, PLASMA  CBC WITH DIFFERENTIAL/PLATELET  URINALYSIS, COMPLETE (UACMP) WITH MICROSCOPIC  CBC WITH DIFFERENTIAL/PLATELET  POC  SARS CORONAVIRUS 2 AG    RADIOLOGY Images were viewed by me  Right foot x-ray IMPRESSION:  1. No acute osseous abnormality.  2. 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  ____________________________________________   DIFFERENTIAL DIAGNOSIS   Cellulitis, osteomyelitis, sepsis, dehydration, electrolyte abnormality  FINAL ASSESSMENT AND PLAN  Cellulitis, nausea, weakness   Plan: The patient had presented for right foot cellulitis.  We ordered IV vancomycin 1 g.  Patient's labs revealed some mild hyponatremia for which she was given a lean. Patient's imaging did not reveal any acute process.  We will continue outpatient treatment with Keflex for cellulitis.  I will also write for Zofran for nausea.  I think she is having some residual symptoms from the recent Covid vaccine.  Otherwise she is cleared for outpatient follow-up.   Laurence Aly, MD  Note: This note was generated in part or whole with voice recognition software. Voice recognition is usually quite accurate but there are transcription errors that can and very often do occur. I apologize for any typographical errors that were not detected and corrected.     Emily Filbert, MD 12/22/19 2009

## 2019-12-22 NOTE — ED Notes (Signed)
Lab at bedside

## 2019-12-22 NOTE — ED Notes (Signed)
Reviewed discharge instructions, follow-up care, and prescriptions with patient. Patient verbalized understanding of all information reviewed. Patient stable, with no distress noted at this time.    

## 2019-12-27 LAB — CULTURE, BLOOD (ROUTINE X 2)
Culture: NO GROWTH
Culture: NO GROWTH
Special Requests: ADEQUATE
Special Requests: ADEQUATE

## 2020-09-27 ENCOUNTER — Ambulatory Visit: Payer: Medicare HMO | Admitting: Urology

## 2020-12-25 DIAGNOSIS — E1165 Type 2 diabetes mellitus with hyperglycemia: Secondary | ICD-10-CM | POA: Diagnosis not present

## 2020-12-25 DIAGNOSIS — E669 Obesity, unspecified: Secondary | ICD-10-CM | POA: Diagnosis not present

## 2020-12-25 DIAGNOSIS — R197 Diarrhea, unspecified: Secondary | ICD-10-CM | POA: Diagnosis not present

## 2020-12-25 DIAGNOSIS — N3000 Acute cystitis without hematuria: Secondary | ICD-10-CM | POA: Diagnosis not present

## 2020-12-25 DIAGNOSIS — A09 Infectious gastroenteritis and colitis, unspecified: Secondary | ICD-10-CM | POA: Diagnosis not present

## 2020-12-25 DIAGNOSIS — I10 Essential (primary) hypertension: Secondary | ICD-10-CM | POA: Diagnosis not present

## 2020-12-25 DIAGNOSIS — M810 Age-related osteoporosis without current pathological fracture: Secondary | ICD-10-CM | POA: Diagnosis not present

## 2020-12-25 DIAGNOSIS — E039 Hypothyroidism, unspecified: Secondary | ICD-10-CM | POA: Diagnosis not present

## 2020-12-25 DIAGNOSIS — K591 Functional diarrhea: Secondary | ICD-10-CM | POA: Diagnosis not present

## 2020-12-26 DIAGNOSIS — A09 Infectious gastroenteritis and colitis, unspecified: Secondary | ICD-10-CM | POA: Diagnosis not present

## 2021-01-02 DIAGNOSIS — R197 Diarrhea, unspecified: Secondary | ICD-10-CM | POA: Diagnosis not present

## 2021-01-02 DIAGNOSIS — I10 Essential (primary) hypertension: Secondary | ICD-10-CM | POA: Diagnosis not present

## 2021-01-02 DIAGNOSIS — E039 Hypothyroidism, unspecified: Secondary | ICD-10-CM | POA: Diagnosis not present

## 2021-01-02 DIAGNOSIS — Z6831 Body mass index (BMI) 31.0-31.9, adult: Secondary | ICD-10-CM | POA: Diagnosis not present

## 2021-01-02 DIAGNOSIS — N3000 Acute cystitis without hematuria: Secondary | ICD-10-CM | POA: Diagnosis not present

## 2021-01-02 DIAGNOSIS — M810 Age-related osteoporosis without current pathological fracture: Secondary | ICD-10-CM | POA: Diagnosis not present

## 2021-01-02 DIAGNOSIS — E1165 Type 2 diabetes mellitus with hyperglycemia: Secondary | ICD-10-CM | POA: Diagnosis not present

## 2021-01-02 DIAGNOSIS — K591 Functional diarrhea: Secondary | ICD-10-CM | POA: Diagnosis not present

## 2021-01-02 DIAGNOSIS — E669 Obesity, unspecified: Secondary | ICD-10-CM | POA: Diagnosis not present

## 2021-02-07 DIAGNOSIS — H401133 Primary open-angle glaucoma, bilateral, severe stage: Secondary | ICD-10-CM | POA: Diagnosis not present

## 2021-02-17 DIAGNOSIS — R0989 Other specified symptoms and signs involving the circulatory and respiratory systems: Secondary | ICD-10-CM | POA: Diagnosis not present

## 2021-02-17 DIAGNOSIS — J1189 Influenza due to unidentified influenza virus with other manifestations: Secondary | ICD-10-CM | POA: Diagnosis not present

## 2021-02-17 DIAGNOSIS — J029 Acute pharyngitis, unspecified: Secondary | ICD-10-CM | POA: Diagnosis not present

## 2021-02-17 DIAGNOSIS — R112 Nausea with vomiting, unspecified: Secondary | ICD-10-CM | POA: Diagnosis not present

## 2021-02-17 DIAGNOSIS — K449 Diaphragmatic hernia without obstruction or gangrene: Secondary | ICD-10-CM | POA: Diagnosis not present

## 2021-02-17 DIAGNOSIS — R519 Headache, unspecified: Secondary | ICD-10-CM | POA: Diagnosis not present

## 2021-02-17 DIAGNOSIS — J069 Acute upper respiratory infection, unspecified: Secondary | ICD-10-CM | POA: Diagnosis not present

## 2021-02-17 DIAGNOSIS — J209 Acute bronchitis, unspecified: Secondary | ICD-10-CM | POA: Diagnosis not present

## 2021-02-17 DIAGNOSIS — J449 Chronic obstructive pulmonary disease, unspecified: Secondary | ICD-10-CM | POA: Diagnosis not present

## 2021-02-24 DIAGNOSIS — J069 Acute upper respiratory infection, unspecified: Secondary | ICD-10-CM | POA: Diagnosis not present

## 2021-02-24 DIAGNOSIS — E669 Obesity, unspecified: Secondary | ICD-10-CM | POA: Diagnosis not present

## 2021-02-24 DIAGNOSIS — J1189 Influenza due to unidentified influenza virus with other manifestations: Secondary | ICD-10-CM | POA: Diagnosis not present

## 2021-02-24 DIAGNOSIS — J209 Acute bronchitis, unspecified: Secondary | ICD-10-CM | POA: Diagnosis not present

## 2021-02-24 DIAGNOSIS — J029 Acute pharyngitis, unspecified: Secondary | ICD-10-CM | POA: Diagnosis not present

## 2021-02-24 DIAGNOSIS — N8110 Cystocele, unspecified: Secondary | ICD-10-CM | POA: Diagnosis not present

## 2021-02-24 DIAGNOSIS — E1165 Type 2 diabetes mellitus with hyperglycemia: Secondary | ICD-10-CM | POA: Diagnosis not present

## 2021-02-24 DIAGNOSIS — E7800 Pure hypercholesterolemia, unspecified: Secondary | ICD-10-CM | POA: Diagnosis not present

## 2021-02-24 DIAGNOSIS — F411 Generalized anxiety disorder: Secondary | ICD-10-CM | POA: Diagnosis not present

## 2021-02-24 DIAGNOSIS — J449 Chronic obstructive pulmonary disease, unspecified: Secondary | ICD-10-CM | POA: Diagnosis not present

## 2021-02-24 DIAGNOSIS — Z6831 Body mass index (BMI) 31.0-31.9, adult: Secondary | ICD-10-CM | POA: Diagnosis not present

## 2021-02-27 DIAGNOSIS — N8110 Cystocele, unspecified: Secondary | ICD-10-CM | POA: Diagnosis not present

## 2021-02-27 DIAGNOSIS — E039 Hypothyroidism, unspecified: Secondary | ICD-10-CM | POA: Diagnosis not present

## 2021-02-27 DIAGNOSIS — J449 Chronic obstructive pulmonary disease, unspecified: Secondary | ICD-10-CM | POA: Diagnosis not present

## 2021-02-27 DIAGNOSIS — N3000 Acute cystitis without hematuria: Secondary | ICD-10-CM | POA: Diagnosis not present

## 2021-02-27 DIAGNOSIS — F411 Generalized anxiety disorder: Secondary | ICD-10-CM | POA: Diagnosis not present

## 2021-03-04 DIAGNOSIS — R1310 Dysphagia, unspecified: Secondary | ICD-10-CM | POA: Diagnosis not present

## 2021-03-04 DIAGNOSIS — R197 Diarrhea, unspecified: Secondary | ICD-10-CM | POA: Diagnosis not present

## 2021-03-05 DIAGNOSIS — R197 Diarrhea, unspecified: Secondary | ICD-10-CM | POA: Diagnosis not present

## 2021-03-06 ENCOUNTER — Other Ambulatory Visit: Payer: Self-pay | Admitting: Endocrinology

## 2021-03-06 DIAGNOSIS — N3090 Cystitis, unspecified without hematuria: Secondary | ICD-10-CM

## 2021-03-06 DIAGNOSIS — N8110 Cystocele, unspecified: Secondary | ICD-10-CM

## 2021-03-16 ENCOUNTER — Encounter: Payer: Self-pay | Admitting: Emergency Medicine

## 2021-03-16 ENCOUNTER — Emergency Department: Payer: Medicare HMO

## 2021-03-16 ENCOUNTER — Inpatient Hospital Stay
Admission: EM | Admit: 2021-03-16 | Discharge: 2021-03-20 | DRG: 178 | Disposition: A | Discharge status: Home Health | Payer: Medicare HMO | Attending: Family Medicine | Admitting: Family Medicine

## 2021-03-16 DIAGNOSIS — F419 Anxiety disorder, unspecified: Secondary | ICD-10-CM | POA: Diagnosis present

## 2021-03-16 DIAGNOSIS — G8929 Other chronic pain: Secondary | ICD-10-CM | POA: Diagnosis present

## 2021-03-16 DIAGNOSIS — Z90710 Acquired absence of both cervix and uterus: Secondary | ICD-10-CM

## 2021-03-16 DIAGNOSIS — Z882 Allergy status to sulfonamides status: Secondary | ICD-10-CM

## 2021-03-16 DIAGNOSIS — Z91013 Allergy to seafood: Secondary | ICD-10-CM

## 2021-03-16 DIAGNOSIS — W19XXXA Unspecified fall, initial encounter: Secondary | ICD-10-CM | POA: Diagnosis present

## 2021-03-16 DIAGNOSIS — R1032 Left lower quadrant pain: Secondary | ICD-10-CM | POA: Diagnosis present

## 2021-03-16 DIAGNOSIS — R509 Fever, unspecified: Secondary | ICD-10-CM | POA: Diagnosis not present

## 2021-03-16 DIAGNOSIS — U071 COVID-19: Secondary | ICD-10-CM | POA: Diagnosis present

## 2021-03-16 DIAGNOSIS — F32A Depression, unspecified: Secondary | ICD-10-CM | POA: Diagnosis present

## 2021-03-16 DIAGNOSIS — M8580 Other specified disorders of bone density and structure, unspecified site: Secondary | ICD-10-CM | POA: Diagnosis present

## 2021-03-16 DIAGNOSIS — Z885 Allergy status to narcotic agent status: Secondary | ICD-10-CM

## 2021-03-16 DIAGNOSIS — R2681 Unsteadiness on feet: Secondary | ICD-10-CM | POA: Diagnosis not present

## 2021-03-16 DIAGNOSIS — Z974 Presence of external hearing-aid: Secondary | ICD-10-CM

## 2021-03-16 DIAGNOSIS — Z79890 Hormone replacement therapy (postmenopausal): Secondary | ICD-10-CM

## 2021-03-16 DIAGNOSIS — H409 Unspecified glaucoma: Secondary | ICD-10-CM | POA: Diagnosis present

## 2021-03-16 DIAGNOSIS — Z7982 Long term (current) use of aspirin: Secondary | ICD-10-CM

## 2021-03-16 DIAGNOSIS — J449 Chronic obstructive pulmonary disease, unspecified: Secondary | ICD-10-CM | POA: Diagnosis present

## 2021-03-16 DIAGNOSIS — K219 Gastro-esophageal reflux disease without esophagitis: Secondary | ICD-10-CM | POA: Diagnosis present

## 2021-03-16 DIAGNOSIS — E039 Hypothyroidism, unspecified: Secondary | ICD-10-CM | POA: Diagnosis present

## 2021-03-16 DIAGNOSIS — R54 Age-related physical debility: Secondary | ICD-10-CM | POA: Diagnosis present

## 2021-03-16 DIAGNOSIS — R197 Diarrhea, unspecified: Secondary | ICD-10-CM | POA: Diagnosis present

## 2021-03-16 DIAGNOSIS — R1310 Dysphagia, unspecified: Secondary | ICD-10-CM | POA: Diagnosis present

## 2021-03-16 DIAGNOSIS — E860 Dehydration: Secondary | ICD-10-CM | POA: Diagnosis present

## 2021-03-16 DIAGNOSIS — Z888 Allergy status to other drugs, medicaments and biological substances status: Secondary | ICD-10-CM

## 2021-03-16 DIAGNOSIS — H9192 Unspecified hearing loss, left ear: Secondary | ICD-10-CM | POA: Diagnosis present

## 2021-03-16 DIAGNOSIS — Z880 Allergy status to penicillin: Secondary | ICD-10-CM

## 2021-03-16 DIAGNOSIS — Z91041 Radiographic dye allergy status: Secondary | ICD-10-CM

## 2021-03-16 DIAGNOSIS — E871 Hypo-osmolality and hyponatremia: Secondary | ICD-10-CM | POA: Diagnosis present

## 2021-03-16 DIAGNOSIS — Z743 Need for continuous supervision: Secondary | ICD-10-CM | POA: Diagnosis not present

## 2021-03-16 DIAGNOSIS — Z28311 Partially vaccinated for covid-19: Secondary | ICD-10-CM

## 2021-03-16 DIAGNOSIS — Z79899 Other long term (current) drug therapy: Secondary | ICD-10-CM

## 2021-03-16 DIAGNOSIS — R531 Weakness: Secondary | ICD-10-CM | POA: Diagnosis not present

## 2021-03-16 DIAGNOSIS — E739 Lactose intolerance, unspecified: Secondary | ICD-10-CM | POA: Diagnosis present

## 2021-03-16 DIAGNOSIS — A419 Sepsis, unspecified organism: Secondary | ICD-10-CM | POA: Diagnosis not present

## 2021-03-16 DIAGNOSIS — Z2889 Immunization not carried out for other reason: Secondary | ICD-10-CM

## 2021-03-16 DIAGNOSIS — E876 Hypokalemia: Secondary | ICD-10-CM | POA: Diagnosis present

## 2021-03-16 DIAGNOSIS — I10 Essential (primary) hypertension: Secondary | ICD-10-CM | POA: Diagnosis not present

## 2021-03-16 DIAGNOSIS — G2581 Restless legs syndrome: Secondary | ICD-10-CM | POA: Diagnosis present

## 2021-03-16 DIAGNOSIS — Z9049 Acquired absence of other specified parts of digestive tract: Secondary | ICD-10-CM

## 2021-03-16 LAB — COMPREHENSIVE METABOLIC PANEL
ALT: 14 U/L (ref 0–44)
AST: 26 U/L (ref 15–41)
Albumin: 3.4 g/dL — ABNORMAL LOW (ref 3.5–5.0)
Alkaline Phosphatase: 62 U/L (ref 38–126)
Anion gap: 10 (ref 5–15)
BUN: 10 mg/dL (ref 8–23)
CO2: 24 mmol/L (ref 22–32)
Calcium: 8.9 mg/dL (ref 8.9–10.3)
Chloride: 96 mmol/L — ABNORMAL LOW (ref 98–111)
Creatinine, Ser: 0.73 mg/dL (ref 0.44–1.00)
GFR, Estimated: >60 mL/min (ref >60)
Glucose, Bld: 111 mg/dL — ABNORMAL HIGH (ref 70–99)
Potassium: 3 mmol/L — ABNORMAL LOW (ref 3.5–5.1)
Sodium: 130 mmol/L — ABNORMAL LOW (ref 135–145)
Total Bilirubin: 0.8 mg/dL (ref 0.3–1.2)
Total Protein: 6.6 g/dL (ref 6.5–8.1)

## 2021-03-16 LAB — CBC WITH DIFFERENTIAL/PLATELET
Abs Immature Granulocytes: 0.01 10*3/uL (ref 0.00–0.07)
Basophils Absolute: 0 10*3/uL (ref 0.0–0.1)
Basophils Relative: 1 %
Eosinophils Absolute: 0 10*3/uL (ref 0.0–0.5)
Eosinophils Relative: 0 %
HCT: 31.2 % — ABNORMAL LOW (ref 36.0–46.0)
Hemoglobin: 10.1 g/dL — ABNORMAL LOW (ref 12.0–15.0)
Immature Granulocytes: 0 %
Lymphocytes Relative: 23 %
Lymphs Abs: 1.2 10*3/uL (ref 0.7–4.0)
MCH: 26.3 pg (ref 26.0–34.0)
MCHC: 32.4 g/dL (ref 30.0–36.0)
MCV: 81.3 fL (ref 80.0–100.0)
Monocytes Absolute: 0.9 10*3/uL (ref 0.1–1.0)
Monocytes Relative: 17 %
Neutro Abs: 3.1 10*3/uL (ref 1.7–7.7)
Neutrophils Relative %: 59 %
Platelets: 290 10*3/uL (ref 150–400)
RBC: 3.84 MIL/uL — ABNORMAL LOW (ref 3.87–5.11)
RDW: 17.1 % — ABNORMAL HIGH (ref 11.5–15.5)
WBC: 5.1 10*3/uL (ref 4.0–10.5)
nRBC: 0 % (ref 0.0–0.2)

## 2021-03-16 LAB — URINALYSIS, COMPLETE (UACMP) WITH MICROSCOPIC
Bacteria, UA: NONE SEEN
Bilirubin Urine: NEGATIVE
Glucose, UA: NEGATIVE mg/dL
Hgb urine dipstick: NEGATIVE
Ketones, ur: 20 mg/dL — AB
Leukocytes,Ua: NEGATIVE
Nitrite: NEGATIVE
Protein, ur: NEGATIVE mg/dL
Specific Gravity, Urine: 1.014 (ref 1.005–1.030)
Squamous Epithelial / HPF: NONE SEEN (ref 0–5)
pH: 7 (ref 5.0–8.0)

## 2021-03-16 LAB — LACTIC ACID, PLASMA: Lactic Acid, Venous: 0.9 mmol/L (ref 0.5–1.9)

## 2021-03-16 LAB — TROPONIN I (HIGH SENSITIVITY): Troponin I (High Sensitivity): 17 ng/L (ref <18)

## 2021-03-16 MED ORDER — LACTATED RINGERS IV SOLN: INTRAVENOUS | Status: DC

## 2021-03-16 MED ORDER — POTASSIUM CHLORIDE CRYS ER 20 MEQ PO TBCR
40.0000 meq | EXTENDED_RELEASE_TABLET | Freq: Once | ORAL | Status: AC
  Administered 2021-03-17: 40 meq via ORAL
  Filled 2021-03-16: qty 2

## 2021-03-16 MED ORDER — ACETAMINOPHEN 500 MG PO TABS
1000.0000 mg | ORAL_TABLET | Freq: Once | ORAL | Status: AC
  Administered 2021-03-16: 1000 mg via ORAL
  Filled 2021-03-16: qty 2

## 2021-03-16 MED ORDER — SODIUM CHLORIDE 0.9 % IV BOLUS (SEPSIS): 1000.0000 mL | Freq: Once | INTRAVENOUS | Status: DC

## 2021-03-16 MED ORDER — SODIUM CHLORIDE 0.9 % IV BOLUS (SEPSIS): 500.0000 mL | Freq: Once | INTRAVENOUS | Status: DC

## 2021-03-16 MED ORDER — LEVOFLOXACIN IN D5W 750 MG/150ML IV SOLN
750.0000 mg | Freq: Once | INTRAVENOUS | Status: DC
  Administered 2021-03-16: 750 mg via INTRAVENOUS
  Filled 2021-03-16: qty 150

## 2021-03-16 MED ORDER — SODIUM CHLORIDE 0.9 % IV BOLUS: 500.0000 mL | Freq: Once | INTRAVENOUS | Status: DC

## 2021-03-16 NOTE — ED Provider Notes (Signed)
[facility] Emergency Department Provider Note   ____________________________________________   Event Date/Time   First MD Initiated Contact with Patient 03/16/21 2300     (approximate)  I have reviewed the triage vital signs and the nursing notes.   HISTORY  Chief Complaint Weakness    HPI Pamela Reed is a 87 y.o. female brought to the ED via EMS from home with a chief complaint of generalized weakness.  Patient reports fall secondary to generalized weakness this afternoon while on the commode.  She was unable to get up by herself.  Had to crawl to the phone to call her family.  Son states this is unusual for the patient.  Patient reports she has finished 2 rounds of antibiotics and 1 round of steroid for upper respiratory infection and recent diagnosis of COPD.  Endorses nonproductive cough.  Denies fever, chest pain, shortness of breath, abdominal pain, nausea, vomiting, dysuria or diarrhea.        Past Medical History:  Diagnosis Date  . Arthritis    spine  . Cervicalgia   . Chronic cystitis   . Deafness in left ear    s/p skull fracture - age 80  . GERD (gastroesophageal reflux disease)   . Headache    daily. s/p skull fracture as 87 yr old  . 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 Hypertension   . Hypothyroidism   . Mixed incontinence urge and stress   . Vertigo   . Wears hearing aid    right ear    There are no problems to display for this patient.   Past Surgical History:  Procedure Laterality Date  . ABDOMINAL HYSTERECTOMY    . CATARACT EXTRACTION W/ INTRAOCULAR LENS IMPLANT    . CHOLECYSTECTOMY    . COLONOSCOPY    . EYE SURGERY Bilateral    eye shunts  . FOOT SURGERY    . GLAUCOMA SURGERY    . PHOTOCOAGULATION WITH LASER Left 09/07/2016   Procedure: PHOTOCOAGULATION WITH LASER;  Surgeon: Sherald Hess, MD;  Location: [facility];  Service: Ophthalmology;  Laterality: Left;  LEFT  .  TONSILLECTOMY      Prior to Admission medications   Medication Sig Start Date End Date Taking? Authorizing Provider  aspirin 81 MG tablet Take 81 mg by mouth daily.    [provider]  FLUoxetine (PROZAC) 10 MG tablet Take 10 mg by mouth daily.     [provider]  furosemide (LASIX) 20 MG tablet Take 20 mg by mouth daily.     [provider]  levothyroxine (SYNTHROID, LEVOTHROID) 50 MCG tablet Take 50 mcg by mouth daily before breakfast.    [provider]  lisinopril (PRINIVIL,ZESTRIL) 20 MG tablet Take 20 mg by mouth 2 (two) times daily.     [provider]  Magnesium 500 MG TABS Take 500 mg by mouth daily.    [provider]  metoprolol succinate (TOPROL-XL) 25 MG 24 hr tablet Take 25 mg by mouth 2 (two) times daily.     [provider]  Netarsudil Dimesylate (RHOPRESSA) 0.02 % SOLN Place 1 drop into both eyes at bedtime.    [provider]  omeprazole (PRILOSEC OTC) 20 MG tablet Take 20 mg by mouth daily as needed (heartburn).    [provider]  ondansetron (ZOFRAN) 4 MG tablet Take 1 tablet (4 mg total) by mouth daily as needed for nausea or  vomiting. 12/22/19   Emily Filbert, MD  VYZULTA 0.024 % SOLN Place 1 drop into both eyes at bedtime.     [provider]    Allergies Contrast media [iodinated diagnostic agents], Iodine, Sulfasalazine, Dye fdc red [red dye], Lactose intolerance (gi), Penicillins, Shellfish allergy, Sulfa antibiotics, Venlafaxine, Codeine, and Ropinirole  History reviewed. No pertinent family history.  Social History Social History   Tobacco Use  . Smoking status: Never Smoker  . Smokeless tobacco: Never Used  Vaping Use  . Vaping Use: Never used  Substance Use Topics  . Alcohol use: No  . Drug use: No    Review of Systems  Constitutional: Positive for generalized weakness.  No fever/chills Eyes: No visual changes. ENT: No sore throat. Cardiovascular:  Denies chest pain. Respiratory: Positive for cough.  Denies shortness of breath. Gastrointestinal: No abdominal pain.  No nausea, no vomiting.  No diarrhea.  No constipation. Genitourinary: Negative for dysuria. Musculoskeletal: Negative for back pain. Skin: Negative for rash. Neurological: Negative for headaches, focal weakness or numbness.   ____________________________________________   PHYSICAL EXAM:  VITAL SIGNS: ED Triage Vitals  Enc Vitals Group     BP 03/16/21 2239 (!) 198/81     Pulse Rate 03/16/21 2239 69     Resp 03/16/21 2239 (!) 21     Temp 03/16/21 2258 (!) 100.7 F (38.2 C)     Temp Source 03/16/21 2258 Rectal     SpO2 03/16/21 2239 96 %     Weight --      Height --      Head Circumference --      Peak Flow --      Pain Score --      Pain Loc --      Pain Edu? --      Excl. in GC? --     Constitutional: Alert and oriented.  Elderly appearing and in mild acute distress. Eyes: Conjunctivae are normal. PERRL. EOMI. Head: Atraumatic. Nose: No congestion/rhinnorhea. Mouth/Throat: Mucous membranes are mildly dry. Neck: No stridor.   Cardiovascular: Normal rate, regular rhythm. Grossly normal heart sounds.  Good peripheral circulation. Respiratory: Increased respiratory effort.  No retractions. Lungs with scattered rhonchi. Gastrointestinal: Soft and nontender. No distention. No abdominal bruits. No CVA tenderness. Musculoskeletal: No lower extremity tenderness nor edema.  No joint effusions. Neurologic:  Normal speech and language. No gross focal neurologic deficits are appreciated.  Skin:  Skin is warm, dry and intact. No rash noted. Psychiatric: Mood and affect are normal. Speech and behavior are normal.  ____________________________________________   LABS (all labs ordered are listed, but only abnormal results are displayed)  Labs Reviewed  RESP PANEL BY RT-PCR (FLU A&B, COVID) ARPGX2 - Abnormal; Notable for the following components:      Result  Value   SARS Coronavirus 2 by RT PCR POSITIVE (*)    All other components within normal limits  COMPREHENSIVE METABOLIC PANEL - Abnormal; Notable for the following components:   Sodium 130 (*)    Potassium 3.0 (*)    Chloride 96 (*)    Glucose, Bld 111 (*)    Albumin 3.4 (*)    All other components within normal limits  CBC WITH DIFFERENTIAL/PLATELET - Abnormal; Notable for the following components:   RBC 3.84 (*)    Hemoglobin 10.1 (*)    HCT 31.2 (*)    RDW 17.1 (*)    All other components within normal limits  URINALYSIS, COMPLETE (UACMP) WITH MICROSCOPIC - Abnormal;  Notable for the following components:   Color, Urine YELLOW (*)    APPearance CLEAR (*)    Ketones, ur 20 (*)    All other components within normal limits  BRAIN NATRIURETIC PEPTIDE - Abnormal; Notable for the following components:   B Natriuretic Peptide 459.2 (*)    All other components within normal limits  URINE CULTURE  CULTURE, BLOOD (ROUTINE X 2)  CULTURE, BLOOD (ROUTINE X 2)  LACTIC ACID, PLASMA  LACTIC ACID, PLASMA  PROCALCITONIN  C-REACTIVE PROTEIN  D-DIMER, QUANTITATIVE  FERRITIN  FIBRINOGEN  HEPATITIS B SURFACE ANTIGEN  LACTATE DEHYDROGENASE  TYPE AND SCREEN  TROPONIN I (HIGH SENSITIVITY)  TROPONIN I (HIGH SENSITIVITY)   ____________________________________________  EKG  ED ECG REPORT I, Karynn Deblasi J, the attending physician, personally viewed and interpreted this ECG.   Date: 03/16/2021  EKG Time: 2236  Rate: 71  Rhythm: normal EKG, normal sinus rhythm  Axis: Normal  Intervals:none  ST&T Change: Nonspecific  ____________________________________________  RADIOLOGY I, Ellis Mehaffey J, personally viewed and evaluated these images (plain radiographs) as part of my medical decision making, as well as reviewing the written report by the radiologist.  ED MD interpretation: Cardiac enlargement with interstitial edema  Official radiology report(s): DG Chest Portable 1 View  Result Date:  03/16/2021 CLINICAL DATA:  Sepsis. History of hypertension and gastroesophageal reflux disease. EXAM: PORTABLE CHEST 1 VIEW COMPARISON:  08/03/2016 FINDINGS: Cardiac enlargement with interstitial changes in the lungs likely due to edema. No definite pleural effusions or pneumothorax. Mediastinal contours appear intact. Calcified and tortuous aorta. IMPRESSION: Cardiac enlargement with interstitial edema. Electronically Signed   By: Burman NievesWilliam  Stevens M.D.   On: 03/16/2021 23:29    ____________________________________________   PROCEDURES  Procedure(s) performed (including Critical Care):  .1-3 Lead EKG Interpretation Performed by: Irean HongSung, Shannin Naab J, MD Authorized by: Irean HongSung, Leeana Creer J, MD     Interpretation: normal     ECG rate:  71   ECG rate assessment: normal     Rhythm: sinus rhythm     Ectopy: none     Conduction: normal   Comments:     Patient placed on cardiac monitor to evaluate for arrhythmias    CRITICAL CARE Performed by: Irean HongSUNG,Tahliyah Anagnos J   Total critical care time: 30 minutes  Critical care time was exclusive of separately billable procedures and treating other patients.  Critical care was necessary to treat or prevent imminent or life-threatening deterioration.  Critical care was time spent personally by me on the following activities: development of treatment plan with patient and/or surrogate as well as nursing, discussions with consultants, evaluation of patient's response to treatment, examination of patient, obtaining history from patient or surrogate, ordering and performing treatments and interventions, ordering and review of laboratory studies, ordering and review of radiographic studies, pulse oximetry and re-evaluation of patient's condition.  ____________________________________________   INITIAL IMPRESSION / ASSESSMENT AND PLAN / ED COURSE  As part of my medical decision making, I reviewed the following data within the electronic MEDICAL RECORD NUMBER History obtained  from family, Nursing notes reviewed and incorporated, Labs reviewed, EKG interpreted, Old chart reviewed, Radiograph reviewed and Notes from prior ED visits     87 year old female presenting with generalized weakness, fever and cough. Differential includes, but is not limited to, viral syndrome, bronchitis including COPD exacerbation, pneumonia, reactive airway disease including asthma, CHF including exacerbation with or without pulmonary/interstitial edema, pneumothorax, ACS, thoracic trauma, and pulmonary embolism.  Code sepsis initiated for fever, increased respiratory rate and most likely community  acquired pneumonia based on patient's recent history.  Will replete potassium, initiate IV fluid resuscitation and reassess.  Clinical Course as of 03/17/21 0031  Sun Mar 16, 2021  2349 Lactate normal, no infiltrate seen on chest x-ray.  I have canceled code sepsis 30 cc/kilo IV fluids. [JS]  Mon Mar 17, 2021  0030 Updated patient and son on her COVID-positive status.  She received 1/2 vaccines and never received the second due to side effects.  Will discuss with hospital services for admission. [JS]    Clinical Course User Index [JS] Irean Hong, MD     ____________________________________________   FINAL CLINICAL IMPRESSION(S) / ED DIAGNOSES  Final diagnoses:  Generalized weakness  Dehydration  Hyponatremia  Hypokalemia  COVID-19     ED Discharge Orders    None      *Please note:  [place] was evaluated in Emergency Department on 03/17/2021 for the symptoms described in the history of present illness. She was evaluated in the context of the global COVID-19 pandemic, which necessitated consideration that the patient might be at risk for infection with the SARS-CoV-2 virus that causes COVID-19. Institutional protocols and algorithms that pertain to the evaluation of patients at risk for COVID-19 are in a state of rapid change based on information released by regulatory  bodies including the CDC and federal and state organizations. These policies and algorithms were followed during the patient's care in the ED.  Some ED evaluations and interventions may be delayed as a result of limited staffing during and the pandemic.*   Note:  This document was prepared using Dragon voice recognition software and may include unintentional dictation errors.   Irean Hong, MD 03/17/21 0120

## 2021-03-16 NOTE — Progress Notes (Signed)
CODE SEPSIS - PHARMACY COMMUNICATION  **Broad Spectrum Antibiotics should be administered within 1 hour of Sepsis diagnosis**  Time Code Sepsis Called/Page Received: 2321  Antibiotics Ordered: Levaquin  Time of 1st antibiotic administration: 2351  Otelia Sergeant, PharmD, [facility] 03/16/2021 11:22 PM

## 2021-03-17 ENCOUNTER — Other Ambulatory Visit: Payer: Self-pay

## 2021-03-17 DIAGNOSIS — K219 Gastro-esophageal reflux disease without esophagitis: Secondary | ICD-10-CM | POA: Diagnosis not present

## 2021-03-17 DIAGNOSIS — F32A Depression, unspecified: Secondary | ICD-10-CM

## 2021-03-17 DIAGNOSIS — Z888 Allergy status to other drugs, medicaments and biological substances status: Secondary | ICD-10-CM | POA: Diagnosis not present

## 2021-03-17 DIAGNOSIS — W19XXXA Unspecified fall, initial encounter: Secondary | ICD-10-CM | POA: Diagnosis not present

## 2021-03-17 DIAGNOSIS — E039 Hypothyroidism, unspecified: Secondary | ICD-10-CM

## 2021-03-17 DIAGNOSIS — E876 Hypokalemia: Secondary | ICD-10-CM

## 2021-03-17 DIAGNOSIS — Z91041 Radiographic dye allergy status: Secondary | ICD-10-CM | POA: Diagnosis not present

## 2021-03-17 DIAGNOSIS — R197 Diarrhea, unspecified: Secondary | ICD-10-CM | POA: Diagnosis not present

## 2021-03-17 DIAGNOSIS — Z28311 Partially vaccinated for covid-19: Secondary | ICD-10-CM | POA: Diagnosis not present

## 2021-03-17 DIAGNOSIS — Z79890 Hormone replacement therapy (postmenopausal): Secondary | ICD-10-CM | POA: Diagnosis not present

## 2021-03-17 DIAGNOSIS — I10 Essential (primary) hypertension: Secondary | ICD-10-CM | POA: Diagnosis not present

## 2021-03-17 DIAGNOSIS — H409 Unspecified glaucoma: Secondary | ICD-10-CM | POA: Diagnosis not present

## 2021-03-17 DIAGNOSIS — M8580 Other specified disorders of bone density and structure, unspecified site: Secondary | ICD-10-CM | POA: Diagnosis not present

## 2021-03-17 DIAGNOSIS — Z9049 Acquired absence of other specified parts of digestive tract: Secondary | ICD-10-CM | POA: Diagnosis not present

## 2021-03-17 DIAGNOSIS — E860 Dehydration: Secondary | ICD-10-CM | POA: Diagnosis not present

## 2021-03-17 DIAGNOSIS — F419 Anxiety disorder, unspecified: Secondary | ICD-10-CM | POA: Diagnosis not present

## 2021-03-17 DIAGNOSIS — Z2889 Immunization not carried out for other reason: Secondary | ICD-10-CM | POA: Diagnosis not present

## 2021-03-17 DIAGNOSIS — Z974 Presence of external hearing-aid: Secondary | ICD-10-CM | POA: Diagnosis not present

## 2021-03-17 DIAGNOSIS — R1310 Dysphagia, unspecified: Secondary | ICD-10-CM

## 2021-03-17 DIAGNOSIS — G2581 Restless legs syndrome: Secondary | ICD-10-CM | POA: Diagnosis not present

## 2021-03-17 DIAGNOSIS — R1032 Left lower quadrant pain: Secondary | ICD-10-CM | POA: Diagnosis not present

## 2021-03-17 DIAGNOSIS — Z880 Allergy status to penicillin: Secondary | ICD-10-CM | POA: Diagnosis not present

## 2021-03-17 DIAGNOSIS — Z882 Allergy status to sulfonamides status: Secondary | ICD-10-CM | POA: Diagnosis not present

## 2021-03-17 DIAGNOSIS — R531 Weakness: Secondary | ICD-10-CM | POA: Diagnosis not present

## 2021-03-17 DIAGNOSIS — U071 COVID-19: Secondary | ICD-10-CM | POA: Diagnosis present

## 2021-03-17 DIAGNOSIS — E871 Hypo-osmolality and hyponatremia: Secondary | ICD-10-CM

## 2021-03-17 DIAGNOSIS — Z885 Allergy status to narcotic agent status: Secondary | ICD-10-CM | POA: Diagnosis not present

## 2021-03-17 DIAGNOSIS — K449 Diaphragmatic hernia without obstruction or gangrene: Secondary | ICD-10-CM | POA: Diagnosis not present

## 2021-03-17 LAB — BASIC METABOLIC PANEL
Anion gap: 10 (ref 5–15)
BUN: 11 mg/dL (ref 8–23)
CO2: 22 mmol/L (ref 22–32)
Calcium: 8.7 mg/dL — ABNORMAL LOW (ref 8.9–10.3)
Chloride: 98 mmol/L (ref 98–111)
Creatinine, Ser: 0.91 mg/dL (ref 0.44–1.00)
GFR, Estimated: >60 mL/min (ref >60)
Glucose, Bld: 263 mg/dL — ABNORMAL HIGH (ref 70–99)
Potassium: 3 mmol/L — ABNORMAL LOW (ref 3.5–5.1)
Sodium: 130 mmol/L — ABNORMAL LOW (ref 135–145)

## 2021-03-17 LAB — RESP PANEL BY RT-PCR (FLU A&B, COVID) ARPGX2
Influenza A by PCR: NEGATIVE
Influenza B by PCR: NEGATIVE
SARS Coronavirus 2 by RT PCR: POSITIVE — AB

## 2021-03-17 LAB — TYPE AND SCREEN
ABO/RH(D): AB POS
Antibody Screen: NEGATIVE

## 2021-03-17 LAB — FIBRINOGEN: Fibrinogen: 339 mg/dL (ref 210–475)

## 2021-03-17 LAB — PROCALCITONIN: Procalcitonin: <0.1 ng/mL

## 2021-03-17 LAB — FERRITIN: Ferritin: 27 ng/mL (ref 11–307)

## 2021-03-17 LAB — GLUCOSE, CAPILLARY: Glucose-Capillary: 161 mg/dL — ABNORMAL HIGH (ref 70–99)

## 2021-03-17 LAB — LACTATE DEHYDROGENASE: LDH: 192 U/L (ref 98–192)

## 2021-03-17 LAB — BRAIN NATRIURETIC PEPTIDE
B Natriuretic Peptide: 459.2 pg/mL — ABNORMAL HIGH (ref 0.0–100.0)
B Natriuretic Peptide: 534.1 pg/mL — ABNORMAL HIGH (ref 0.0–100.0)

## 2021-03-17 LAB — D-DIMER, QUANTITATIVE: D-Dimer, Quant: 1.14 ug/mL-FEU — ABNORMAL HIGH (ref 0.00–0.50)

## 2021-03-17 LAB — HEMOGLOBIN A1C
Hgb A1c MFr Bld: 5.8 % — ABNORMAL HIGH (ref 4.8–5.6)
Mean Plasma Glucose: 119.76 mg/dL

## 2021-03-17 LAB — C-REACTIVE PROTEIN: CRP: 0.6 mg/dL (ref <1.0)

## 2021-03-17 LAB — TROPONIN I (HIGH SENSITIVITY): Troponin I (High Sensitivity): 18 ng/L — ABNORMAL HIGH (ref <18)

## 2021-03-17 LAB — TSH: TSH: 1.063 u[IU]/mL (ref 0.350–4.500)

## 2021-03-17 MED ORDER — HYDRALAZINE HCL 20 MG/ML IJ SOLN
10.0000 mg | Freq: Four times a day (QID) | INTRAMUSCULAR | Status: DC | PRN
  Administered 2021-03-17 – 2021-03-18 (×3): 10 mg via INTRAVENOUS
  Filled 2021-03-17 (×3): qty 1

## 2021-03-17 MED ORDER — FLUOXETINE HCL 20 MG PO TABS
10.0000 mg | ORAL_TABLET | Freq: Every day | ORAL | Status: DC
  Filled 2021-03-17: qty 1

## 2021-03-17 MED ORDER — MAGNESIUM OXIDE -MG SUPPLEMENT 400 (240 MG) MG PO TABS
400.0000 mg | ORAL_TABLET | Freq: Every day | ORAL | Status: DC
  Administered 2021-03-18 – 2021-03-20 (×3): 400 mg via ORAL
  Filled 2021-03-17 (×5): qty 1

## 2021-03-17 MED ORDER — PANTOPRAZOLE SODIUM 40 MG PO TBEC: 40.0000 mg | DELAYED_RELEASE_TABLET | Freq: Every day | ORAL | Status: DC | PRN

## 2021-03-17 MED ORDER — LISINOPRIL 20 MG PO TABS
20.0000 mg | ORAL_TABLET | Freq: Two times a day (BID) | ORAL | Status: DC
  Administered 2021-03-17 – 2021-03-20 (×7): 20 mg via ORAL
  Filled 2021-03-17: qty 1
  Filled 2021-03-17: qty 2
  Filled 2021-03-17 (×5): qty 1

## 2021-03-17 MED ORDER — LEVOTHYROXINE SODIUM 50 MCG PO TABS
50.0000 ug | ORAL_TABLET | Freq: Every day | ORAL | Status: DC
  Administered 2021-03-17 – 2021-03-20 (×4): 50 ug via ORAL
  Filled 2021-03-17 (×4): qty 1

## 2021-03-17 MED ORDER — NETARSUDIL DIMESYLATE 0.02 % OP SOLN
1.0000 [drp] | Freq: Every day | OPHTHALMIC | Status: DC
  Administered 2021-03-17 – 2021-03-19 (×3): 1 [drp] via OPHTHALMIC
  Filled 2021-03-17: qty 1

## 2021-03-17 MED ORDER — POTASSIUM CHLORIDE CRYS ER 20 MEQ PO TBCR
40.0000 meq | EXTENDED_RELEASE_TABLET | Freq: Two times a day (BID) | ORAL | Status: DC
  Filled 2021-03-17: qty 2

## 2021-03-17 MED ORDER — ASPIRIN EC 81 MG PO TBEC
81.0000 mg | DELAYED_RELEASE_TABLET | Freq: Every day | ORAL | Status: DC
  Administered 2021-03-17 – 2021-03-20 (×4): 81 mg via ORAL
  Filled 2021-03-17 (×5): qty 1

## 2021-03-17 MED ORDER — FUROSEMIDE 40 MG PO TABS: 20.0000 mg | ORAL_TABLET | Freq: Every day | ORAL | Status: DC

## 2021-03-17 MED ORDER — SODIUM CHLORIDE 0.9 % IV SOLN
100.0000 mg | Freq: Once | INTRAVENOUS | Status: DC
  Filled 2021-03-17: qty 20

## 2021-03-17 MED ORDER — SODIUM CHLORIDE 0.9 % IV SOLN: 200.0000 mg | Freq: Once | INTRAVENOUS | Status: DC

## 2021-03-17 MED ORDER — METHYLPREDNISOLONE SODIUM SUCC 125 MG IJ SOLR
1.0000 mg/kg | Freq: Two times a day (BID) | INTRAMUSCULAR | Status: DC
  Administered 2021-03-17: 75.625 mg via INTRAVENOUS
  Filled 2021-03-17: qty 2

## 2021-03-17 MED ORDER — FLUOXETINE HCL 10 MG PO CAPS
10.0000 mg | ORAL_CAPSULE | Freq: Every day | ORAL | Status: DC
  Administered 2021-03-17 – 2021-03-20 (×4): 10 mg via ORAL
  Filled 2021-03-17 (×5): qty 1

## 2021-03-17 MED ORDER — SODIUM CHLORIDE 0.9 % IV SOLN: 100.0000 mg | Freq: Every day | INTRAVENOUS | Status: DC

## 2021-03-17 MED ORDER — MOMETASONE FURO-FORMOTEROL FUM 100-5 MCG/ACT IN AERO
2.0000 | INHALATION_SPRAY | Freq: Two times a day (BID) | RESPIRATORY_TRACT | Status: DC
  Administered 2021-03-17 – 2021-03-20 (×6): 2 via RESPIRATORY_TRACT
  Filled 2021-03-17: qty 8.8

## 2021-03-17 MED ORDER — METOPROLOL SUCCINATE ER 25 MG PO TB24
25.0000 mg | ORAL_TABLET | Freq: Two times a day (BID) | ORAL | Status: DC
  Administered 2021-03-17 (×2): 25 mg via ORAL
  Filled 2021-03-17 (×3): qty 1

## 2021-03-17 MED ORDER — ACETAMINOPHEN 325 MG PO TABS
650.0000 mg | ORAL_TABLET | Freq: Four times a day (QID) | ORAL | Status: DC | PRN
  Administered 2021-03-18: 650 mg via ORAL
  Filled 2021-03-17: qty 2

## 2021-03-17 MED ORDER — PREDNISONE 20 MG PO TABS: 50.0000 mg | ORAL_TABLET | Freq: Every day | ORAL | Status: DC

## 2021-03-17 MED ORDER — ONDANSETRON HCL 4 MG/2ML IJ SOLN
4.0000 mg | Freq: Four times a day (QID) | INTRAMUSCULAR | Status: DC | PRN
  Administered 2021-03-18 – 2021-03-20 (×4): 4 mg via INTRAVENOUS
  Filled 2021-03-17 (×4): qty 2

## 2021-03-17 MED ORDER — LATANOPROSTENE BUNOD 0.024 % OP SOLN
1.0000 [drp] | Freq: Every day | OPHTHALMIC | Status: DC
  Administered 2021-03-17 – 2021-03-19 (×3): 1 [drp] via OPHTHALMIC
  Filled 2021-03-17: qty 1

## 2021-03-17 MED ORDER — AMLODIPINE BESYLATE 5 MG PO TABS
5.0000 mg | ORAL_TABLET | Freq: Every day | ORAL | Status: DC
  Administered 2021-03-18: 5 mg via ORAL
  Filled 2021-03-17: qty 1

## 2021-03-17 MED ORDER — POTASSIUM CHLORIDE 20 MEQ PO PACK
40.0000 meq | PACK | Freq: Two times a day (BID) | ORAL | Status: AC
  Administered 2021-03-17 – 2021-03-18 (×2): 40 meq via ORAL
  Filled 2021-03-17 (×2): qty 2

## 2021-03-17 MED ORDER — ALPRAZOLAM 0.5 MG PO TABS: 0.2500 mg | ORAL_TABLET | Freq: Two times a day (BID) | ORAL | Status: DC | PRN

## 2021-03-17 MED ORDER — FUROSEMIDE 20 MG PO TABS
20.0000 mg | ORAL_TABLET | Freq: Every day | ORAL | Status: DC
  Administered 2021-03-17 – 2021-03-20 (×4): 20 mg via ORAL
  Filled 2021-03-17 (×4): qty 1

## 2021-03-17 MED ORDER — SODIUM CHLORIDE 0.9 % IV SOLN: INTRAVENOUS | Status: AC

## 2021-03-17 MED ORDER — INSULIN ASPART 100 UNIT/ML ~~LOC~~ SOLN
0.0000 [IU] | Freq: Three times a day (TID) | SUBCUTANEOUS | Status: DC
  Administered 2021-03-17: 2 [IU] via SUBCUTANEOUS
  Administered 2021-03-19: 1 [IU] via SUBCUTANEOUS
  Filled 2021-03-17: qty 1

## 2021-03-17 MED ORDER — METHYLPREDNISOLONE SODIUM SUCC 500 MG IJ SOLR: 1.0000 mg/kg | Freq: Two times a day (BID) | INTRAMUSCULAR | Status: DC

## 2021-03-17 MED ORDER — ENOXAPARIN SODIUM 40 MG/0.4ML ~~LOC~~ SOLN
40.0000 mg | SUBCUTANEOUS | Status: DC
  Administered 2021-03-17 – 2021-03-20 (×4): 40 mg via SUBCUTANEOUS
  Filled 2021-03-17 (×4): qty 0.4

## 2021-03-17 MED ORDER — ACETAMINOPHEN 325 MG PO TABS
ORAL_TABLET | ORAL | Status: AC
  Administered 2021-03-17: 650 mg via ORAL
  Filled 2021-03-17: qty 2

## 2021-03-17 NOTE — Progress Notes (Signed)
Remdesivir - Pharmacy Brief Note   O:  CXR: "Cardiac enlargement with interstitial changes in the lungs likely due to edema. No definite pleural effusions or pneumothorax." SpO2: 96-99% on RA   A/P:  Remdesivir 200 mg IVPB once followed by 100 mg IVPB daily x 4 days.   Otelia Sergeant, PharmD, [facility] 03/17/2021 12:40 AM

## 2021-03-17 NOTE — Progress Notes (Signed)
OT Cancellation Note  Patient Details Name: Pamela Reed MRN: [id number] DOB: [birth year]   Cancelled Treatment:    Reason Eval/Treat Not Completed: Medical issues which prohibited therapy. OT order received and chart reviewed. Pt continues to experience hypertention with BP of 207/77 with bed mobility during PT evaluation and most recent BP of 189/72 in supine. OT will continue to follow pt and re-attempt when pt is able to safely participate in OT intervention.   Jackquline Denmark, MS, OTR/L , CBIS ascom [phone]  03/17/21, 2:22 PM   03/17/2021, 2:20 PM

## 2021-03-17 NOTE — H&P (Signed)
History and Physical    Pamela Reed:[id number] DOB: 1931/04/07 DOA: 03/16/2021  PCP: Alan Mulder, MD  Patient coming from: Home, son at bedside  I have personally briefly reviewed patient's old medical records in [facility]  Chief Complaint: Weakness, fall  HPI: Pamela Reed is a 87 y.o. female with medical history significant for hypertension, hypothyroidism, osteopenia, restless leg syndrome, dysphagia who presents with concerns of weakness and fall.  Patient was otherwise in her normal state of health but then woke up this morning feeling very weak, fatigue and had decreased appetite.  She lives alone and went to the bathroom today and fell due to weakness and just could not get back up.  She had to crawl around to she could finally call her son for help.  She then just try to go to bed and felt very fatigued.  Has not been able to eat.  She reports some diffuse abdominal pain although that has been present for some time.  She also has some persistent diarrhea that is being evaluated by GI.  She also reports a cough and was diagnosed with bronchitis and later COPD at the end of March.  States she has been on several rounds of antibiotics, prednisone and has been put on albuterol and another inhaler that she takes twice a day. She has only received one dose of COVID vaccine due to adverse GI effects and weakness.   ED Course: She was febrile up to 100.7, hypertensive with systolic of 190s.  No leukocytosis.  Hemoglobin of 10.1 which appears to be her baseline.  Sodium of 130.  K of 3.  Creatinine normal at 0.73.  BG of 111.  COVID PCR returned positive.  Chest x-ray showing possible interstitial edema.  Review of Systems: Constitutional: No Weight Change, No Fever ENT/Mouth: No sore throat, No Rhinorrhea Eyes: No Eye Pain, No Vision Changes Cardiovascular: No Chest Pain, + SOB, No Palpitations Respiratory: + Cough, No Sputum, No Wheezing, no Dyspnea   Gastrointestinal: +Nausea, No Vomiting, + Diarrhea, No Constipation, + Pain Genitourinary: no Urinary Incontinence, No Urgency, No Flank Pain Musculoskeletal: No Arthralgias, No Myalgias Skin: No Skin Lesions, No Pruritus, Neuro: + Weakness, No Numbness,  No Loss of Consciousness, No Syncope Psych: No Anxiety/Panic, No Depression, + decrease appetite Heme/Lymph: No Bruising, No Bleeding  Past Medical History:  Diagnosis Date  . Arthritis    spine  . Cervicalgia   . Chronic cystitis   . Deafness in left ear    s/p skull fracture - age 68  . GERD (gastroesophageal reflux disease)   . Headache    daily. s/p skull fracture as 87 yr old  . 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 Hypertension   . Hypothyroidism   . Mixed incontinence urge and stress   . Vertigo   . Wears hearing aid    right ear    Past Surgical History:  Procedure Laterality Date  . ABDOMINAL HYSTERECTOMY    . CATARACT EXTRACTION W/ INTRAOCULAR LENS IMPLANT    . CHOLECYSTECTOMY    . COLONOSCOPY    . EYE SURGERY Bilateral    eye shunts  . FOOT SURGERY    . GLAUCOMA SURGERY    . PHOTOCOAGULATION WITH LASER Left 09/07/2016   Procedure: PHOTOCOAGULATION WITH LASER;  Surgeon: Sherald Hess, MD;  Location: [facility];  Service: Ophthalmology;  Laterality: Left;  LEFT  . TONSILLECTOMY  reports that she has never smoked. She has never used smokeless tobacco. She reports that she does not drink alcohol and does not use drugs. Social History  Allergies  Allergen Reactions  . Contrast Media [Iodinated Diagnostic Agents] Shortness Of Breath  . Iodine Shortness Of Breath  . Sulfasalazine Itching and Swelling  . Dye Fdc Red [Red Dye]   . Lactose Intolerance (Gi)     GI upset  . Penicillins     Childhood event-unknown reaction  . Shellfish Allergy Nausea Only and Swelling    Throat swelling  . Sulfa Antibiotics Itching and Swelling  . Venlafaxine Swelling    Throat  .  Codeine Palpitations  . Ropinirole Nausea Only    Family Hx No other family member with COVID   Prior to Admission medications   Medication Sig Start Date End Date Taking? Authorizing Provider  aspirin 81 MG tablet Take 81 mg by mouth daily.   Yes [provider]  FLUoxetine (PROZAC) 10 MG tablet Take 10 mg by mouth daily.    Yes [provider]  furosemide (LASIX) 20 MG tablet Take 20 mg by mouth daily.    Yes [provider]  levothyroxine (SYNTHROID, LEVOTHROID) 50 MCG tablet Take 50 mcg by mouth daily before breakfast.   Yes [provider]  lisinopril (PRINIVIL,ZESTRIL) 20 MG tablet Take 20 mg by mouth 2 (two) times daily.    Yes [provider]  Magnesium 500 MG TABS Take 500 mg by mouth daily.   Yes [provider]  metoprolol succinate (TOPROL-XL) 25 MG 24 hr tablet Take 25 mg by mouth 2 (two) times daily.    Yes [provider]  Netarsudil Dimesylate (RHOPRESSA) 0.02 % SOLN Place 1 drop into both eyes at bedtime.   Yes [provider]  omeprazole (PRILOSEC OTC) 20 MG tablet Take 20 mg by mouth daily as needed (heartburn).   Yes [provider]  ondansetron (ZOFRAN) 4 MG tablet Take 1 tablet (4 mg total) by mouth daily as needed for nausea or vomiting. 12/22/19  Yes Emily FilbertWilliams, Jonathan E, MD  VYZULTA 0.024 % SOLN Place 1 drop into both eyes at bedtime.    Yes [provider]    Physical Exam: Vitals:   03/16/21 2239 03/16/21 2258 03/17/21 0011  BP: (!) 198/81  (!) 183/73  Pulse: 69  74  Resp: (!) 21  20  Temp:  (!) 100.7 F (38.2 C) 98.7 F (37.1 C)  TempSrc:  Rectal Oral  SpO2: 96%  99%    Constitutional: NAD, calm, comfortable, elderly female lying flat in bed Vitals:   03/16/21 2239 03/16/21 2258 03/17/21 0011  BP: (!) 198/81  (!) 183/73  Pulse: 69  74  Resp: (!) 21  20  Temp:  (!) 100.7 F (38.2 C) 98.7 F (37.1 C)  TempSrc:  Rectal Oral  SpO2: 96%  99%   Eyes: PERRL,  lids and conjunctivae normal ENMT: Mucous membranes are moist.  Neck: normal, supple Respiratory: Right basilar crackles.  Normal respiratory effort. No accessory muscle use.  Cardiovascular: Regular rate and rhythm, no murmurs / rubs / gallops. No extremity edema.  Abdomen: no tenderness, no masses palpated.  Bowel sounds positive.  Musculoskeletal: no clubbing / cyanosis. No joint deformity upper and lower extremities. Good ROM, no contractures. Normal muscle tone.  Skin: no rashes, lesions, ulcers. No induration Neurologic: CN 2-12 grossly intact. Sensation intact,  Strength 5/5 in all 4.  Psychiatric: Normal judgment and insight. Alert  and oriented x 3. Normal mood.     Labs on Admission: I have personally reviewed following labs and imaging studies  CBC: Recent Labs  Lab 03/16/21 2242  WBC 5.1  NEUTROABS 3.1  HGB 10.1*  HCT 31.2*  MCV 81.3  PLT 290   Basic Metabolic Panel: Recent Labs  Lab 03/16/21 2242  NA 130*  K 3.0*  CL 96*  CO2 24  GLUCOSE 111*  BUN 10  CREATININE 0.73  CALCIUM 8.9   GFR: CrCl cannot be calculated (Unknown ideal weight.). Liver Function Tests: Recent Labs  Lab 03/16/21 2242  AST 26  ALT 14  ALKPHOS 62  BILITOT 0.8  PROT 6.6  ALBUMIN 3.4*   No results for input(s): LIPASE, AMYLASE in the last 168 hours. No results for input(s): AMMONIA in the last 168 hours. Coagulation Profile: No results for input(s): INR, PROTIME in the last 168 hours. Cardiac Enzymes: No results for input(s): CKTOTAL, CKMB, CKMBINDEX, TROPONINI in the last 168 hours. BNP (last 3 results) No results for input(s): PROBNP in the last 8760 hours. HbA1C: No results for input(s): HGBA1C in the last 72 hours. CBG: No results for input(s): GLUCAP in the last 168 hours. Lipid Profile: No results for input(s): CHOL, HDL, LDLCALC, TRIG, CHOLHDL, LDLDIRECT in the last 72 hours. Thyroid Function Tests: No results for input(s): TSH, T4TOTAL, FREET4, T3FREE, THYROIDAB  in the last 72 hours. Anemia Panel: No results for input(s): VITAMINB12, FOLATE, FERRITIN, TIBC, IRON, RETICCTPCT in the last 72 hours. Urine analysis:    Component Value Date/Time   COLORURINE YELLOW (A) 03/16/2021 2242   APPEARANCEUR CLEAR (A) 03/16/2021 2242   APPEARANCEUR Hazy (A) 10/04/2019 1315   LABSPEC 1.014 03/16/2021 2242   PHURINE 7.0 03/16/2021 2242   GLUCOSEU NEGATIVE 03/16/2021 2242   HGBUR NEGATIVE 03/16/2021 2242   BILIRUBINUR NEGATIVE 03/16/2021 2242   BILIRUBINUR Negative 10/04/2019 1315   KETONESUR 20 (A) 03/16/2021 2242   PROTEINUR NEGATIVE 03/16/2021 2242   NITRITE NEGATIVE 03/16/2021 2242   LEUKOCYTESUR NEGATIVE 03/16/2021 2242    Radiological Exams on Admission: DG Chest Portable 1 View  Result Date: 03/16/2021 CLINICAL DATA:  Sepsis. History of hypertension and gastroesophageal reflux disease. EXAM: PORTABLE CHEST 1 VIEW COMPARISON:  08/03/2016 FINDINGS: Cardiac enlargement with interstitial changes in the lungs likely due to edema. No definite pleural effusions or pneumothorax. Mediastinal contours appear intact. Calcified and tortuous aorta. IMPRESSION: Cardiac enlargement with interstitial edema. Electronically Signed   By: Burman Nieves M.D.   On: 03/16/2021 23:29      Assessment/Plan  weakness/fall secondary to COVID-19 viral infection - IV Solu-Medrol started in the ED.  Will continue daily. -IV remdesivir also ordered in the ED but son is hesitant since patient has numerous adverse effects to medication including the COVID vaccine.  Since patient is not hypoxic and no significant respiratory symptoms, I think it is reasonable to hold off for tonight and reassess in the morning. - Follow inflammatory markers  Hyponatremia Likely due to hypovolemia.  Will follow after IV continuous fluids for 8 hours.  Hypokalemia Repleted with oral K in the ED  Dysphagia Patient has been noting dysphagia with food and medication.  She is currently being  followed by GI outpatient has planned EGD. Placed on dysphasic 1 diet for now Speech therapy consult tomorrow  Hypertension Continue lisinopril, metoprolol, Lasix  Hypothyroidism Continue levothyroxine  anxiety/depression Continue home Prozac and as needed Xanax  Glaucoma Continue ophthalmic solutions   DVT prophylaxis:.Lovenox Code Status: Full Family  Communication: Plan discussed with patient and son at bedside   Son would like to be notified with updates and decision making  disposition Plan: Home with observation Consults called:  Admission status: Observation  Level of care: Med-Surg  Status is: Observation  The patient remains OBS appropriate and will d/c before 2 midnights.  Dispo: The patient is from: Home              Anticipated d/c is to: Home              Patient currently is not medically stable to d/c.   Difficult to place patient No         Anselm Jungling DO [facility]   If 7PM-7AM, please contact night-coverage [email or web address]   03/17/2021, 1:13 AM

## 2021-03-17 NOTE — Progress Notes (Signed)
Pt being followed by [facility] for sepsis protocol. 

## 2021-03-17 NOTE — Progress Notes (Addendum)
  PROGRESS NOTE    Pamela Reed  WPY:[id number] DOB: 1931-01-08 DOA: 03/16/2021  PCP: Alan Mulder, MD    LOS - 0    Patient admitted overnight with Covid-19 infection after presenting with progressive generalized weakness and a fall after which she could not get herself up.    Interval subjective: Seen in ED on hold for a bed.  Reports feeling a little better.  No fever/chills, SOB, cough or chest pain.  Has chronic intermittent abdominal pain, some LLQ pain currently.  Says she has an abdomen scan scheduled on Thursday, asks if able to get that here.  Exam: awake, alert, NAD.  Heart sounds RRR. Lungs clear with diminished bases, no edema, protuberant abdomen but soft with mild LLQ tenderness.   Principal Problem:   COVID-19 virus infection Active Problems:   Hyponatremia   Hypokalemia   Dysphagia   HTN (hypertension)   Hypothyroidism   Anxiety   Depression   Glaucoma    I have reviewed the full H&P by Dr. Cyndia Bent in detail, and I agree with the assessment and plan as outlined therein.  In addition: --Tylenol PRN pain/headache/fever --stop steroids given not hypoxic, will resume if hypoxia develops --PRN hydralazine added --started amlodipine as BP still uncontrolled    No Charge    Pennie Banter, DO [facility]   If 7PM-7AM, please contact night-coverage [email or web address] 03/17/2021, 7:55 AM

## 2021-03-17 NOTE — ED Notes (Signed)
Informed RN  Bed assigned [id number]

## 2021-03-17 NOTE — Evaluation (Signed)
Physical Therapy Evaluation Patient Details Name: Pamela Reed MRN: [id number] DOB: 09/08/1931 Today's Date: 03/17/2021   History of Present Illness  Pamela Reed is a 87 y.o. female with medical history significant for hypertension, hypothyroidism, osteopenia, restless leg syndrome, dysphagia who presents with concerns of weakness and fall.     Clinical Impression  PT chart reviewed and seen for evaluation this AM.  Pt pleasant and was able to provide prior functionality at home.  Pt lives at home alone and was independent with AD's.  She ambulated around the house with a walker when she first wakes, and a cane when she is outside of her house.  Pt is A&O x4.  Pt was able to perform ankle pumps and supine hip abduction, however demonstrated elevated BP with exercises so they were discontinued.  Pt demonstrated some generalized weakness of the LE, but has good ROM currently.  D/c options will be discussed when BP is managed and is dependent on mobility consult.    Follow Up Recommendations Other (comment) (D/C recommendation will be based on mobility function at later time when BP is well controlled.)    Equipment Recommendations  Rolling walker with 5" wheels    Recommendations for Other Services       Precautions / Restrictions Precautions Precautions: None      Mobility  Bed Mobility               General bed mobility comments: deferred due to BP being elevated with minimal exercises.    Transfers                    Ambulation/Gait                Stairs            Wheelchair Mobility    Modified Rankin (Stroke Patients Only)       Balance Overall balance assessment: History of Falls                                           Pertinent Vitals/Pain Pain Assessment: No/denies pain    Home Living Family/patient expects to be discharged to:: Private residence Living Arrangements: Alone Available Help at  Discharge: Family Type of Home: House Home Access: Stairs to enter Entrance Stairs-Rails: Right Entrance Stairs-Number of Steps: 1 Home Layout: One level Home Equipment: Walker - 4 wheels;Walker - standard;Cane - single point;Grab bars - tub/shower;Hand held shower head      Prior Function Level of Independence: Independent with assistive device(s)               Hand Dominance   Dominant Hand: Right    Extremity/Trunk Assessment   Upper Extremity Assessment Upper Extremity Assessment: Defer to OT evaluation    Lower Extremity Assessment Lower Extremity Assessment: Generalized weakness       Communication   Communication: No difficulties  Cognition Arousal/Alertness: Awake/alert Behavior During Therapy: WFL for tasks assessed/performed Overall Cognitive Status: Within Functional Limits for tasks assessed                                        General Comments      Exercises Total Joint Exercises Ankle Circles/Pumps: AROM;Strengthening;Both;15 reps;Supine Hip ABduction/ADduction: AROM;Strengthening;Both;10 reps;Supine Other Exercises Other Exercises: Pt  educated on role of PT and ther services provided during hospital stay.   Assessment/Plan    PT Assessment Patient needs continued PT services  PT Problem List Decreased strength;Decreased activity tolerance;Decreased mobility       PT Treatment Interventions DME instruction;Gait training;Stair training;Functional mobility training;Therapeutic activities;Therapeutic exercise;Balance training    PT Goals (Current goals can be found in the Care Plan section)  Acute Rehab PT Goals Patient Stated Goal: To go home. PT Goal Formulation: With patient Time For Goal Achievement: 03/31/21 Potential to Achieve Goals: Fair    Frequency Min 2X/week   Barriers to discharge Decreased caregiver support Current barriers include BP being elevated beyond safety protocols for exercises.     Co-evaluation               AM-PAC PT "6 Clicks" Mobility  Outcome Measure Help needed turning from your back to your side while in a flat bed without using bedrails?: A Little Help needed moving from lying on your back to sitting on the side of a flat bed without using bedrails?: A Little Help needed moving to and from a bed to a chair (including a wheelchair)?: A Lot Help needed standing up from a chair using your arms (e.g., wheelchair or bedside chair)?: A Lot Help needed to walk in hospital room?: A Lot Help needed climbing 3-5 steps with a railing? : A Lot 6 Click Score: 14    End of Session   Activity Tolerance: Treatment limited secondary to medical complications (Comment) (Systolic BP >200 with low-level exercises.) Patient left: in bed;with call bell/phone within reach Nurse Communication: Other (comment) (Findings of BP.) PT Visit Diagnosis: Unsteadiness on feet (R26.81);Repeated falls (R29.6);Muscle weakness (generalized) (M62.81);History of falling (Z91.81);Difficulty in walking, not elsewhere classified (R26.2)    Time: 1041-1110 PT Time Calculation (min) (ACUTE ONLY): 29 min   Charges:   PT Evaluation $PT Eval Low Complexity: 1 Low PT Treatments $Therapeutic Exercise: 8-22 mins        Nolon Bussing, PT, DPT 03/17/21, 12:42 PM

## 2021-03-17 NOTE — Progress Notes (Signed)
Patient is unable to swallow potassium tablets. Order received from Dr Denton Lank to change tablets to powder

## 2021-03-17 NOTE — Plan of Care (Signed)
Care plan reviewed with patient.

## 2021-03-18 ENCOUNTER — Inpatient Hospital Stay: Payer: Medicare HMO

## 2021-03-18 LAB — COMPREHENSIVE METABOLIC PANEL
ALT: 18 U/L (ref 0–44)
AST: 32 U/L (ref 15–41)
Albumin: 3.4 g/dL — ABNORMAL LOW (ref 3.5–5.0)
Alkaline Phosphatase: 60 U/L (ref 38–126)
Anion gap: 10 (ref 5–15)
BUN: 17 mg/dL (ref 8–23)
CO2: 24 mmol/L (ref 22–32)
Calcium: 9.1 mg/dL (ref 8.9–10.3)
Chloride: 102 mmol/L (ref 98–111)
Creatinine, Ser: 0.89 mg/dL (ref 0.44–1.00)
GFR, Estimated: >60 mL/min (ref >60)
Glucose, Bld: 95 mg/dL (ref 70–99)
Potassium: 3.4 mmol/L — ABNORMAL LOW (ref 3.5–5.1)
Sodium: 136 mmol/L (ref 135–145)
Total Bilirubin: 0.7 mg/dL (ref 0.3–1.2)
Total Protein: 6.8 g/dL (ref 6.5–8.1)

## 2021-03-18 LAB — CBC WITH DIFFERENTIAL/PLATELET
Abs Immature Granulocytes: 0.03 10*3/uL (ref 0.00–0.07)
Basophils Absolute: 0 10*3/uL (ref 0.0–0.1)
Basophils Relative: 0 %
Eosinophils Absolute: 0 10*3/uL (ref 0.0–0.5)
Eosinophils Relative: 0 %
HCT: 33.9 % — ABNORMAL LOW (ref 36.0–46.0)
Hemoglobin: 10.9 g/dL — ABNORMAL LOW (ref 12.0–15.0)
Immature Granulocytes: 0 %
Lymphocytes Relative: 38 %
Lymphs Abs: 2.8 10*3/uL (ref 0.7–4.0)
MCH: 25.9 pg — ABNORMAL LOW (ref 26.0–34.0)
MCHC: 32.2 g/dL (ref 30.0–36.0)
MCV: 80.5 fL (ref 80.0–100.0)
Monocytes Absolute: 1 10*3/uL (ref 0.1–1.0)
Monocytes Relative: 14 %
Neutro Abs: 3.5 10*3/uL (ref 1.7–7.7)
Neutrophils Relative %: 48 %
Platelets: 297 10*3/uL (ref 150–400)
RBC: 4.21 MIL/uL (ref 3.87–5.11)
RDW: 17.2 % — ABNORMAL HIGH (ref 11.5–15.5)
WBC: 7.4 10*3/uL (ref 4.0–10.5)
nRBC: 0 % (ref 0.0–0.2)

## 2021-03-18 LAB — URINE CULTURE: Culture: NO GROWTH

## 2021-03-18 LAB — GLUCOSE, CAPILLARY
Glucose-Capillary: 113 mg/dL — ABNORMAL HIGH (ref 70–99)
Glucose-Capillary: 99 mg/dL (ref 70–99)
Glucose-Capillary: 118 mg/dL — ABNORMAL HIGH (ref 70–99)
Glucose-Capillary: 118 mg/dL — ABNORMAL HIGH (ref 70–99)
Glucose-Capillary: 98 mg/dL (ref 70–99)

## 2021-03-18 LAB — C-REACTIVE PROTEIN: CRP: 0.5 mg/dL (ref <1.0)

## 2021-03-18 MED ORDER — ENSURE ENLIVE PO LIQD
237.0000 mL | Freq: Two times a day (BID) | ORAL | Status: DC
  Administered 2021-03-18 – 2021-03-20 (×4): 237 mL via ORAL

## 2021-03-18 MED ORDER — CARVEDILOL 6.25 MG PO TABS: 6.2500 mg | ORAL_TABLET | Freq: Two times a day (BID) | ORAL | Status: DC

## 2021-03-18 MED ORDER — MELATONIN 5 MG PO TABS
5.0000 mg | ORAL_TABLET | Freq: Every day | ORAL | Status: DC
  Administered 2021-03-18 – 2021-03-19 (×2): 5 mg via ORAL
  Filled 2021-03-18 (×2): qty 1

## 2021-03-18 MED ORDER — CARVEDILOL 12.5 MG PO TABS
12.5000 mg | ORAL_TABLET | Freq: Two times a day (BID) | ORAL | Status: DC
  Administered 2021-03-18: 12.5 mg via ORAL
  Filled 2021-03-18 (×2): qty 1

## 2021-03-18 MED ORDER — CARVEDILOL 12.5 MG PO TABS
12.5000 mg | ORAL_TABLET | Freq: Two times a day (BID) | ORAL | Status: DC
  Administered 2021-03-19 – 2021-03-20 (×3): 12.5 mg via ORAL
  Filled 2021-03-18 (×3): qty 1

## 2021-03-18 NOTE — Progress Notes (Addendum)
PROGRESS NOTE    Pamela Reed   UEA:[id number]  DOB: 08/19/1931  PCP: Alan MulderMorayati, Shamil J, MD    DOA: 03/16/2021 LOS: 1   Brief Narrative   Pamela Reed is a 87 y.o. female with medical history significant for hypertension, hypothyroidism, osteopenia, restless leg syndrome, dysphagia who presented to the ED on 03/16/21 with concerns of weakness and fall, in addition to poor appetite.  She reported LLQ abdominal pain that is chronic and being evaluated by GI (CT scan scheduled 4/28).  She had been having cough and recently treated for bouts of bronchitis and COPD flare with steroids and antibiotics as outpatient.  Patient found to be positive for Covid-19 infection with fever of 100.7 F in the ED.  She has only received one dose of COVID vaccine due to adverse GI effects and weakness.  Labs notable for Na 130, K 3.0., leukocytosis.     Assessment & Plan   Principal Problem:   COVID-19 virus infection Active Problems:   Hyponatremia   Hypokalemia   Dysphagia   HTN (hypertension)   Hypothyroidism   Anxiety   Depression   Glaucoma   Generalized weakness and Fall secondary to COVID-19 viral infection - no respiratory symptoms. ED gave Solu-medrol.  Stopped steroids after admission given no hypoxia. Remdesivir deferred - Son is hesitantsince patient has numerous adverse effects to medication including the COVID vaccine.  Since patient is not hypoxic and no significant respiratory symptoms. 4/26 - Pt feeling better --Follow inflammatory markers --PT and OT --Fall precautions  Hyponatremia - Likely due to hypovolemia.   Resolved with IV fluids x 8 hrs.  Na 130>>136. Off fluids. --Monitor BMP  Hypokalemia - replaced with oral K in the ED --Monitor BMP and replace PRN  Dysphagia / Hx of esophageal stenosis vs stricture - Patient has been noting dysphagia with food and medication.   She is currently being followed by GI outpatient has planned EGD in June.   --SLP  consulted, appreciate diet recs --Dysphagia 1, Ensures  Accelerated hypertension - POA Hx of Hypertension - -- Continue home lisinopril, Lasix -- Amlodipine was added, d/c'd and instead -- Metoprolol was changed to Coreg  4/26 - labile BP's --PRN hydralazine  Chronic LLQ abdominal pain and intermittent diarrhea -  Pt follows with Dr. Norma Fredricksonoledo of GI, evaluation of this is ongoing.   CT abdomen/pelvis was scheduled as outpatient for 4/28. --will get CT while here --pt reports symptoms stable/typical  Hypothyroidism - levothyroxine  Anxiety/depression - Prozac and PRN Xanax  Glaucoma - home ophthalmic solutions  Patient BMI: Body mass index is 29.58 kg/m.   DVT prophylaxis: enoxaparin (LOVENOX) injection 40 mg Start: 03/17/21 0800   Diet:  Diet Orders (From admission, onward)    Start     Ordered   03/18/21 1431  DIET - DYS 1 Room service appropriate? Yes with Assist; Fluid consistency: Thin  Diet effective now       Comments: Pt may have Cottage Cheese and Oatmeal per Speech w/ 2-3 butters, brown sugar. Cream Soups at lunch/dinner meals. Please send yogurt, pudding, ice cream, jello.  Question Answer Comment  Room service appropriate? Yes with Assist   Fluid consistency: Thin      03/18/21 1432            Code Status: Full Code    Subjective 03/18/21    Pt up in chair when seen.  Reports feeling little better.  Left lower abdominal discomfort, but states it's like it  usually is.  Denies cough, SOB, fever or chills.  No N/V/D or other acute complaints.    Disposition Plan & Communication   Status is: Inpatient  Inpatient status is appropriate due to severity of illness.  Elderly frail pt lives alone, very weak, needing another 24-48 hours for further improvement.  Monitor electrolytes and PO intake, pt at risk for recurrent hyponatremia.   Dispo: The patient is from: home              Anticipated d/c is to: home              Patient currently not medically  stable for d/c   Difficult to place patient no   Consults, Procedures, Significant Events   Consultants:   none  Procedures:   none  Antimicrobials:  Anti-infectives (From admission, onward)   Start     Dose/Rate Route Frequency Ordered Stop   03/18/21 1000  remdesivir 100 mg in sodium chloride 0.9 % 100 mL IVPB  Status:  Discontinued       "Followed by" Linked Group Details   100 mg 200 mL/hr over 30 Minutes Intravenous Daily 03/17/21 0037 03/17/21 0112   03/17/21 1000  remdesivir 100 mg in sodium chloride 0.9 % 100 mL IVPB  Status:  Discontinued       "Followed by" Linked Group Details   100 mg 200 mL/hr over 30 Minutes Intravenous Daily 03/17/21 0029 03/17/21 0034   03/17/21 0230  remdesivir 100 mg in sodium chloride 0.9 % 100 mL IVPB  Status:  Discontinued       "Followed by" Linked Group Details   100 mg 200 mL/hr over 30 Minutes Intravenous  Once 03/17/21 0037 03/17/21 0112   03/17/21 0200  remdesivir 100 mg in sodium chloride 0.9 % 100 mL IVPB  Status:  Discontinued       "Followed by" Linked Group Details   100 mg 200 mL/hr over 30 Minutes Intravenous  Once 03/17/21 0037 03/17/21 0112   03/17/21 0030  remdesivir 200 mg in sodium chloride 0.9% 250 mL IVPB  Status:  Discontinued       "Followed by" Linked Group Details   200 mg 580 mL/hr over 30 Minutes Intravenous Once 03/17/21 0029 03/17/21 0034   03/16/21 2330  levofloxacin (LEVAQUIN) IVPB 750 mg  Status:  Discontinued        750 mg 100 mL/hr over 90 Minutes Intravenous  Once 03/16/21 2320 03/16/21 2351        Micro    Objective   Vitals:   03/18/21 0805 03/18/21 0846 03/18/21 1210 03/18/21 1500  BP: (!) 154/64 (!) 140/53 103/60 (!) 110/30  Pulse: 77 78 71 74  Resp: 16  (!) 24 (!) 22  Temp: 98.8 F (37.1 C) 98.5 F (36.9 C) 97.6 F (36.4 C) 97.6 F (36.4 C)  TempSrc: Oral Oral Oral Oral  SpO2: 96% 95% 95% 95%  Weight:      Height:        Intake/Output Summary (Last 24 hours) at 03/18/2021  1810 Last data filed at 03/18/2021 5277 Gross per 24 hour  Intake 0 ml  Output 500 ml  Net -500 ml   Filed Weights   03/17/21 0130  Weight: 75.8 kg    Physical Exam:  General exam: awake, alert, no acute distress HEENT: moist mucus membranes, hard of hearing  Respiratory system: CTAB but diminished bases due to posture in chair, no wheezes, rales or rhonchi, normal respiratory effort. Cardiovascular system: normal  S1/S2, RRR, no pedal edema.   Gastrointestinal system: soft, mild LLQ tenderness wihtout guarding or rebound tenderness, +bowel sounds. Central nervous system: no gross focal neurologic deficits, normal speech Skin: dry, intact, normal temperature, normal color, no rashes seen on visualized skin Psychiatry: normal mood, congruent affect, judgement and insight appear normal  Labs   Data Reviewed: I have personally reviewed following labs and imaging studies  CBC: Recent Labs  Lab 03/16/21 2242 03/18/21 0545  WBC 5.1 7.4  NEUTROABS 3.1 3.5  HGB 10.1* 10.9*  HCT 31.2* 33.9*  MCV 81.3 80.5  PLT 290 297   Basic Metabolic Panel: Recent Labs  Lab 03/16/21 2242 03/17/21 1019 03/18/21 0545  NA 130* 130* 136  K 3.0* 3.0* 3.4*  CL 96* 98 102  CO2 24 22 24   GLUCOSE 111* 263* 95  BUN 10 11 17   CREATININE 0.73 0.91 0.89  CALCIUM 8.9 8.7* 9.1   GFR: Estimated Creatinine Clearance: 43.4 mL/min (by C-G formula based on SCr of 0.89 mg/dL). Liver Function Tests: Recent Labs  Lab 03/16/21 2242 03/18/21 0545  AST 26 32  ALT 14 18  ALKPHOS 62 60  BILITOT 0.8 0.7  PROT 6.6 6.8  ALBUMIN 3.4* 3.4*   No results for input(s): LIPASE, AMYLASE in the last 168 hours. No results for input(s): AMMONIA in the last 168 hours. Coagulation Profile: No results for input(s): INR, PROTIME in the last 168 hours. Cardiac Enzymes: No results for input(s): CKTOTAL, CKMB, CKMBINDEX, TROPONINI in the last 168 hours. BNP (last 3 results) No results for input(s): PROBNP in the  last 8760 hours. HbA1C: Recent Labs    03/17/21 1629  HGBA1C 5.8*   CBG: Recent Labs  Lab 03/17/21 1706 03/17/21 2239 03/18/21 0752 03/18/21 1205 03/18/21 1709  GLUCAP 161* 113* 99 118* 118*   Lipid Profile: No results for input(s): CHOL, HDL, LDLCALC, TRIG, CHOLHDL, LDLDIRECT in the last 72 hours. Thyroid Function Tests: Recent Labs    03/17/21 1019  TSH 1.063   Anemia Panel: Recent Labs    03/17/21 0122  FERRITIN 27   Sepsis Labs: Recent Labs  Lab 03/16/21 2242  PROCALCITON <0.10  LATICACIDVEN 0.9    Recent Results (from the past 240 hour(s))  Blood culture (routine x 2)     Status: None (Preliminary result)   Collection Time: 03/16/21 10:39 PM   Specimen: BLOOD  Result Value Ref Range Status   Specimen Description BLOOD BLOOD RIGHT FOREARM  Final   Special Requests   Final    BOTTLES DRAWN AEROBIC AND ANAEROBIC Blood Culture results may not be optimal due to an excessive volume of blood received in culture bottles   Culture   Final    NO GROWTH 2 DAYS Performed at [facility], [address] [place], [address] [place]    Report Status PENDING  Incomplete  Urine culture     Status: None   Collection Time: 03/16/21 10:42 PM   Specimen: Urine, Random  Result Value Ref Range Status   Specimen Description   Final    URINE, RANDOM Performed at [facility], [address], [place], [address] [place]    Special Requests   Final    NONE Performed at [facility], [address] [place], [address] [place]    Culture   Final    NO GROWTH Performed at [facility], [address] [id number] [place], [address] [place]    Report Status 03/18/2021 FINAL  Final  Blood  culture (routine x 2)     Status: None (Preliminary result)   Collection Time: 03/16/21 10:42 PM   Specimen: BLOOD  Result Value Ref Range Status   Specimen Description BLOOD BLOOD LEFT FOREARM  Final   Special Requests   Final    BOTTLES DRAWN AEROBIC AND  ANAEROBIC Blood Culture adequate volume   Culture   Final    NO GROWTH 2 DAYS Performed at [facility], [address], [place], [place] [address]    Report Status PENDING  Incomplete  Resp Panel by RT-PCR (Flu A&B, Covid) Nasopharyngeal Swab     Status: Abnormal   Collection Time: 03/16/21 11:27 PM   Specimen: Nasopharyngeal Swab; Nasopharyngeal(NP) swabs in vial transport medium  Result Value Ref Range Status   SARS Coronavirus 2 by RT PCR POSITIVE (A) NEGATIVE Final    Comment: RESULT CALLED TO, READ BACK BY AND VERIFIED WITH: NOTIFIED KELLEY PENDLETON RN 03/17/2021 0022 JG (NOTE) SARS-CoV-2 target nucleic acids are DETECTED.  The SARS-CoV-2 RNA is generally detectable in upper respiratory specimens during the acute phase of infection. Positive results are indicative of the presence of the identified virus, but do not rule out bacterial infection or co-infection with other pathogens not detected by the test. Clinical correlation with patient history and other diagnostic information is necessary to determine patient infection status. The expected result is Negative.  Fact Sheet for Patients: BloggerCourse.com  Fact Sheet for Healthcare Providers: SeriousBroker.it  This test is not yet approved or cleared by the [place] FDA and  has been authorized for detection and/or diagnosis of SARS-CoV-2 by FDA under an Emergency Use Authorization (EUA).  This EUA will remain in effect (meaning thi s test can be used) for the duration of  the COVID-19 declaration under Section 564(b)(1) of the Act, 21 U.S.C. section 360bbb-3(b)(1), unless the authorization is terminated or revoked sooner.     Influenza A by PCR NEGATIVE NEGATIVE Final   Influenza B by PCR NEGATIVE NEGATIVE Final    Comment: (NOTE) The Xpert Xpress SARS-CoV-2/FLU/RSV plus assay is intended as an aid in the diagnosis of influenza from Nasopharyngeal swab  specimens and should not be used as a sole basis for treatment. Nasal washings and aspirates are unacceptable for Xpert Xpress SARS-CoV-2/FLU/RSV testing.  Fact Sheet for Patients: BloggerCourse.com  Fact Sheet for Healthcare Providers: SeriousBroker.it  This test is not yet approved or cleared by the [place] FDA and has been authorized for detection and/or diagnosis of SARS-CoV-2 by FDA under an Emergency Use Authorization (EUA). This EUA will remain in effect (meaning this test can be used) for the duration of the COVID-19 declaration under Section 564(b)(1) of the Act, 21 U.S.C. section 360bbb-3(b)(1), unless the authorization is terminated or revoked.  Performed at [facility], [address], [place], [place] [address]       Imaging Studies   CT ABDOMEN PELVIS WO CONTRAST  Result Date: 03/18/2021 CLINICAL DATA:  Diarrhea. Admitted overnight with COVID-19 infection. Progressive generalized weakness. Left lower quadrant pain EXAM: CT ABDOMEN AND PELVIS WITHOUT CONTRAST TECHNIQUE: Multidetector CT imaging of the abdomen and pelvis was performed following the standard protocol without IV contrast. COMPARISON:  02/06/2005 FINDINGS: Lower chest: Bilateral patchy interstitial and ground-glass densities are identified. These are new from previous exam and are consistent with the recent diagnosis of COVID infection. No pleural effusion. Hepatobiliary: No focal liver abnormality is seen. Status post cholecystectomy. No biliary dilatation. Pancreas: Unremarkable. No pancreatic ductal dilatation or surrounding inflammatory  changes. Spleen: Normal in size without focal abnormality. Adrenals/Urinary Tract: Normal adrenal glands. Bilateral renal cortical lobulation and thinning noted. No kidney stone, mass or hydronephrosis identified bilaterally. Bladder appears normal. Stomach/Bowel: Moderate to large hiatal hernia. No bowel wall  thickening, inflammation, or distension. The appendix is not confidently identified. No secondary signs of acute appendicitis however. Vascular/Lymphatic: Aortic atherosclerosis. No aneurysm. No abdominopelvic adenopathy. Reproductive: Status post hysterectomy. Small cyst in the right adnexa measures 2 cm, image 64/2. Other: No free fluid or fluid collections identified within the abdomen or pelvis. Musculoskeletal: Degenerative disc disease identified within the thoracolumbar spine. No acute or suspicious osseous findings IMPRESSION: 1. No acute findings identified within the abdomen or pelvis. 2. Bilateral patchy interstitial and ground-glass densities are identified within the lung bases compatible with the recent diagnosis of COVID infection. 3. Hiatal hernia. 4. Aortic atherosclerosis. 5. Right ovary cyst measures 2 cm. No follow-up imaging recommended. Note: This recommendation does not apply to premenarchal patients and to those with increased risk (genetic, family history, elevated tumor markers or other high-risk factors) of ovarian cancer. Reference: JACR 2020 Feb; 17(2):248-254 Aortic Atherosclerosis (ICD10-I70.0). Electronically Signed   By: Signa Kell M.D.   On: 03/18/2021 16:29   DG Chest Portable 1 View  Result Date: 03/16/2021 CLINICAL DATA:  Sepsis. History of hypertension and gastroesophageal reflux disease. EXAM: PORTABLE CHEST 1 VIEW COMPARISON:  08/03/2016 FINDINGS: Cardiac enlargement with interstitial changes in the lungs likely due to edema. No definite pleural effusions or pneumothorax. Mediastinal contours appear intact. Calcified and tortuous aorta. IMPRESSION: Cardiac enlargement with interstitial edema. Electronically Signed   By: Burman Nieves M.D.   On: 03/16/2021 23:29     Medications   Scheduled Meds: . amLODipine  5 mg Oral Daily  . aspirin EC  81 mg Oral Daily  . [START ON 03/19/2021] carvedilol  6.25 mg Oral BID WC  . enoxaparin (LOVENOX) injection  40 mg  Subcutaneous Q24H  . feeding supplement  237 mL Oral BID BM  . FLUoxetine  10 mg Oral Daily  . furosemide  20 mg Oral Daily  . insulin aspart  0-9 Units Subcutaneous TID WC  . Latanoprostene Bunod  1 drop Both Eyes QHS  . levothyroxine  50 mcg Oral QAC breakfast  . lisinopril  20 mg Oral BID  . magnesium oxide  400 mg Oral Daily  . melatonin  5 mg Oral QHS  . mometasone-formoterol  2 puff Inhalation BID  . Netarsudil Dimesylate  1 drop Both Eyes QHS   Continuous Infusions:     LOS: 1 day    Time spent: 30 minutes    Pennie Banter, DO [facility]  03/18/2021, 6:10 PM      If 7PM-7AM, please contact night-coverage. How to contact the [facility] Attending or Consulting provider 7A - 7P or covering provider during after hours 7P -7A, for this patient?    1. Check the care team in [facility] and look for a) attending/consulting [facility] provider listed and b) the [facility] team listed 2. Log into [email or web address] and use 's universal password to access. If you do not have the password, please contact the hospital operator. 3. Locate the [facility] provider you are looking for under [facility] and page to a number that you can be directly reached. 4. If you still have difficulty reaching the provider, please page the [facility] (Director on Call) for the Hospitalists listed on amion for assistance.

## 2021-03-18 NOTE — Evaluation (Signed)
Occupational Therapy Evaluation Patient Details Name: Pamela Reed MRN: [id number] DOB: Jan 10, 1931 Today's Date: 03/18/2021    History of Present Illness Pamela Reed is a 87 y.o. female with medical history significant for hypertension, hypothyroidism, osteopenia, restless leg syndrome, dysphagia who presents with concerns of weakness and fall.   Clinical Impression   Patient presenting with decreased I in self care, balance, functional mobility/transfers, endurance, and safety awareness.  Patient reports she lives at home alone PTA. She has great family support. Her son assists with medication management and daughter checks in often. She has a friend that comes over daily. She has low vision secondary to glaucoma so she uses microwave to heat up food for meals. She uses RW in home and [facility] in community. She does not drive secondary to vision and family assists her with getting to appoinments. She reports her daughter will likely stay with her at discharge. Pt transferred from bed >BSC with min HHA. Pt having BM and able to perform hygiene with min A for standing balance. Pt then ambulates with CGA without use of AD to recliner chair. Patient will benefit from acute OT to increase overall independence in the areas of ADLs, functional mobility, and safety awareness in order to safely discharge home with family.    Follow Up Recommendations  Home health OT;Supervision/Assistance - 24 hour    Equipment Recommendations  None recommended by OT       Precautions / Restrictions Precautions Precautions: Fall      Mobility Bed Mobility               General bed mobility comments: on BSC with RN when entering the room    Transfers Overall transfer level: Needs assistance Equipment used: 1 person hand held assist Transfers: Sit to/from Stand;Stand Pivot Transfers Sit to Stand: Min guard Stand pivot transfers: Min guard            Balance Overall balance assessment: History  of Falls                                         ADL either performed or assessed with clinical judgement   ADL Overall ADL's : Needs assistance/impaired     Grooming: Wash/dry hands;Wash/dry face;Standing;Min guard               Lower Body Dressing: Minimal assistance;Sit to/from stand   Toilet Transfer: Minimal assistance;BSC   Toileting- Clothing Manipulation and Hygiene: Minimal assistance;Sit to/from stand               Vision Patient Visual Report: No change from baseline              Pertinent Vitals/Pain Pain Assessment: No/denies pain     Hand Dominance Right   Extremity/Trunk Assessment Upper Extremity Assessment Upper Extremity Assessment: Generalized weakness   Lower Extremity Assessment Lower Extremity Assessment: Generalized weakness       Communication Communication Communication: No difficulties   Cognition Arousal/Alertness: Awake/alert Behavior During Therapy: WFL for tasks assessed/performed Overall Cognitive Status: Within Functional Limits for tasks assessed                                                Home Living Family/patient expects to be discharged to:: Private  residence Living Arrangements: Alone Available Help at Discharge: Family;Available PRN/intermittently Type of Home: House Home Access: Stairs to enter [employer] of Steps: 1 Entrance Stairs-Rails: Right Home Layout: One level     Bathroom Shower/Tub: Chief Strategy Officer: Handicapped height Bathroom Accessibility: Yes   Home Equipment: Environmental consultant - 4 wheels;Walker - standard;Cane - single point;Grab bars - tub/shower;Hand held shower head          Prior Functioning/Environment Level of Independence: Independent with assistive device(s)                 OT Problem List: Decreased strength;Decreased activity tolerance;Impaired balance (sitting and/or standing);Decreased knowledge of  precautions;Pain;Decreased safety awareness;Decreased knowledge of use of DME or AE;Cardiopulmonary status limiting activity      OT Treatment/Interventions: Self-care/ADL training;Therapeutic exercise;Energy conservation;DME and/or AE instruction;Therapeutic activities;Balance training;Patient/family education;Manual therapy    OT Goals(Current goals can be found in the care plan section) Acute Rehab OT Goals Patient Stated Goal: To go home. OT Goal Formulation: With patient Time For Goal Achievement: 04/01/21 Potential to Achieve Goals: Good  OT Frequency: Min 2X/week   Barriers to D/C:    none known at this time          AM-PAC OT "6 Clicks" Daily Activity     Outcome Measure Help from another person eating meals?: None Help from another person taking care of personal grooming?: A Little Help from another person toileting, which includes using toliet, bedpan, or urinal?: A Little Help from another person bathing (including washing, rinsing, drying)?: A Little Help from another person to put on and taking off regular upper body clothing?: None Help from another person to put on and taking off regular lower body clothing?: A Little 6 Click Score: 20   End of Session Equipment Utilized During Treatment: Other (comment) (BSC) Nurse Communication: Mobility status  Activity Tolerance: Patient tolerated treatment well Patient left: in chair;with call bell/phone within reach;with chair alarm set  OT Visit Diagnosis: Unsteadiness on feet (R26.81);Muscle weakness (generalized) (M62.81)                Time: 1020-1055 OT Time Calculation (min): 35 min Charges:  OT General Charges $OT Visit: 1 Visit OT Evaluation $OT Eval Low Complexity: 1 Low OT Treatments $Self Care/Home Management : 23-37 mins  Jackquline Denmark, MS, OTR/L , CBIS ascom [phone]  03/18/21, 1:00 PM

## 2021-03-18 NOTE — Evaluation (Addendum)
Clinical/Bedside Swallow Evaluation Patient Details  Name: Pamela Reed MRN: [id number] Date of Birth: [birth year]  Today's Date: 03/18/2021 Time: SLP Start Time (ACUTE ONLY): 0920 SLP Stop Time (ACUTE ONLY): 1020 SLP Time Calculation (min) (ACUTE ONLY): 60 min  Past Medical History:  Past Medical History:  Diagnosis Date  . Arthritis    spine  . Cervicalgia   . Chronic cystitis   . Deafness in left ear    s/p skull fracture - age 87  . GERD (gastroesophageal reflux disease)   . Headache    daily. s/p skull fracture as 87 yr old  . 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 Hypertension   . Hypothyroidism   . Mixed incontinence urge and stress   . Vertigo   . Wears hearing aid    right ear   Past Surgical History:  Past Surgical History:  Procedure Laterality Date  . ABDOMINAL HYSTERECTOMY    . CATARACT EXTRACTION W/ INTRAOCULAR LENS IMPLANT    . CHOLECYSTECTOMY    . COLONOSCOPY    . EYE SURGERY Bilateral    eye shunts  . FOOT SURGERY    . GLAUCOMA SURGERY    . PHOTOCOAGULATION WITH LASER Left 09/07/2016   Procedure: PHOTOCOAGULATION WITH LASER;  Surgeon: Sherald Hess, MD;  Location[facility];  Service: Ophthalmology;  Laterality: Left;  LEFT  . TONSILLECTOMY     HPI:  Pt is a 87 y.o. female with medical history significant for hypertension, hypothyroidism, osteopenia, restless leg syndrome, Esophageal phase dysphagia w/ Esophageal Dilation ~10 years ago and scheduled w/ GI ~next month for same who presents with concerns of weakness and fall. Patient reports she has finished 2 rounds of antibiotics and 1 round of steroid for upper respiratory infection and recent diagnosis of COPD.  Endorses nonproductive cough.  CXR Imaging: Cardiac enlargement with interstitial edema.  Pt is Covid Positive.   Assessment / Plan / Recommendation Clinical Impression  Pt appears to present w/ adequate oropharyngeal phase swallowing function w/ No overt  oropharyngeal phase dysphagia appreciated w/ trials accepted; No neuromuscular swallowing deficits appreciated. Pt is at reduced risk for aspiration from an oropharyngeal phase standpoint following general aspiration precautions. Pt has a baseline of REFLUX s/s w/ Esophageal Dysmotility. ANY Dysmotility or Regurgitation of Reflux material can increase risk for aspiration of the Reflux material during Retrograde flow thus impact Voicing and Pulmonary status. Pt described ongoing issues w/ "food getting stuch in my chest" pointing to mid sternum area then described the discomfort has moved superiorly at times. She feels this impacts her ability to take full meals at times. Pt stated she is scheduled for an EGD w/ her GI to address possible "stretching again".    Pt was repositionined more UPRIGHT in bed for this evaluation -- discussed the importance of sitting fully Upright for ease of Esophageal motiltiy. She consumed trials of thin liquids Via Cup and Straw, and purees w/ no immediate, overt clinical s/s of aspiration noted; clear vocal quality b/t trials, no decline in pulmonary status, no multiple swallows noted post initial pharyngeal swallow. Oral phase appeared [facility] for bolus management and timely A-P transfer/clearing of material. Pt consumed Pills CRUSHED in Puree w/ NSG/SLP -- much ease w/ swallowing Pills in this manner d/t number of Pills as well as Esophageal Dysmotility. OM exam was [facility] for oral clearing; lingual/labial movements. No unilateral weakness. Speech clear. Of Note, pt stated she had Esophageal Dilation ~10 years  ago and felt like she was "closing up again" -- she has an appt w/ GI scheduled for Dilation in ~1 month she said. Strongly suggested she keep this appt and follow tx recommendations of GI in order to lessen risk for Regurgitation w/ aspiration risk episodes.   Recommend continue current Pureed/ mashed foods for ease of Esophageal phase clearing until seen by GI (moistened foods);  Thin liquids. Less straw use if able d/t air swallowing. Recommend Pills Crushed in Puree for Esophageal clearing. Recommend general aspiration precautions w/ REFLUX PRECAUTIONS including Rest Breaks during meals/oral intake to allow for Esophageal clearing. REFLUX precautions strongly recommended to lessen chance for Regurgitation. Recommend pt continue f/u w/ GI for management of Reflux and tx as indicated. Thorough Educationa nd Disucssion and handouts given on Reflux, diet consistency. MD to reconsult ST services if any new needs while admitted. Recommend Dietician f/u for drink supplement support d/t pt's decreased oral intake w/ foods currently. Pt is to f/u w/ her GI for EGD next month per report.  SLP Visit Diagnosis: Dysphagia, unspecified (R13.10) (Baseline Esophageal  Dysmotility)    Aspiration Risk  Mild aspiration risk;Risk for inadequate nutrition/hydration (d/t Baseline Esophageal  Dysmotility)    Diet Recommendation  Pureed foods diet moreso until seen by/tx'd by GI d/t Chronic Esophageal phase Dysmotility; Thin liquids. Recommend Dietician f/u for Drink Supplements. Recommend general aspiration precautions and full REFLUX precautions d/t Esophageal phase Dysmotility.   Medication Administration: Crushed with puree (dissolved in liquid -- both for ease of clearing)    Other  Recommendations Recommended Consults: Consider GI evaluation;Consider esophageal assessment (pending) Oral Care Recommendations: Oral care BID;Patient independent with oral care Other Recommendations:  (n/a)   Follow up Recommendations None      Frequency and Duration  (n/a)   (n/a)       Prognosis Prognosis for Safe Diet Advancement: Fair (-Good) Barriers to Reach Goals: Time post onset;Severity of deficits (Baseline Esophageal  Dysmotility)      Swallow Study   General Date of Onset: 03/16/21 HPI: Pt is a 87 y.o. female with medical history significant for hypertension, hypothyroidism, osteopenia,  restless leg syndrome, Esophageal phase dysphagia w/ Esophageal Dilation ~10 years ago and scheduled w/ GI ~next month for same who presents with concerns of weakness and fall. Patient reports she has finished 2 rounds of antibiotics and 1 round of steroid for upper respiratory infection and recent diagnosis of COPD.  Endorses nonproductive cough.  CXR Imaging: Cardiac enlargement with interstitial edema.  Pt is Covid Positive. Type of Study: Bedside Swallow Evaluation Previous Swallow Assessment: none Diet Prior to this Study: Dysphagia 1 (puree);Thin liquids (currently; more regular foods at home but unable to eat meats/solids) Temperature Spikes Noted: No (wbc 7.4) Respiratory Status: Room air History of Recent Intubation: No Behavior/Cognition: Alert;Cooperative;Pleasant mood (HOH) Oral Cavity Assessment: Within Functional Limits Oral Care Completed by SLP: Yes Oral Cavity - Dentition: Adequate natural dentition Vision: Functional for self-feeding Self-Feeding Abilities: Able to feed self;Needs set up Patient Positioning: Upright in bed (needed positioning) Baseline Vocal Quality: Normal Volitional Cough: Strong Volitional Swallow: Able to elicit    Oral/Motor/Sensory Function Overall Oral Motor/Sensory Function: Within functional limits   Ice Chips Ice chips: Within functional limits Presentation: Spoon (fed; 2 trials)   Thin Liquid Thin Liquid: Within functional limits Presentation: Cup;Self Fed;Straw (2 trials via Cup; mostly via Straw - ~6+ ozs) Other Comments: also med dissolved in OJ w/ NSG present    Nectar Thick Nectar Thick Liquid: Not tested  Honey Thick Honey Thick Liquid: Not tested   Puree Puree: Within functional limits Presentation: Self Fed;Spoon (8+ trials)   Solid     Solid: Not tested Other Comments: d/t GI Dysmotility       Jerilynn Som, MS, CCC-SLP Speech Language Pathologist Rehab Services [phone] [place] 03/18/2021,11:19  AM

## 2021-03-18 NOTE — Hospital Course (Signed)
Pamela Reed is a 87 y.o. female with medical history significant for hypertension, hypothyroidism, osteopenia, restless leg syndrome, dysphagia who presented to the ED on 03/16/21 with concerns of weakness and fall, in addition to poor appetite.  She reported LLQ abdominal pain that is chronic and being evaluated by GI (CT scan scheduled 4/28).  She had been having cough and recently treated for bouts of bronchitis and COPD flare with steroids and antibiotics as outpatient.  Patient found to be positive for Covid-19 infection with fever of 100.7 F in the ED.  She has only received one dose of COVID vaccine due to adverse GI effects and weakness.  Labs notable for Na 130, K 3.0., leukocytosis.

## 2021-03-18 NOTE — Progress Notes (Signed)
Physical Therapy Treatment Patient Details Name: Pamela Reed MRN: [id number] DOB: [birth year] Today's Date: 03/18/2021    History of Present Illness Pamela Reed is a 87 y.o. female with medical history significant for hypertension, hypothyroidism, osteopenia, restless leg syndrome, dysphagia who presents with concerns of weakness and fall.    PT Comments    Pt stood and walked 1 lap then 2 laps with RW and min guard.  Generally steady but weakness noted.  No LOB or buckling noted with general cues for safety.  Stated daughter lives with her and is able to assist as needed.  Follow Up Recommendations  Other (comment) (D/C recommendation will be based on mobility function at later time when BP is well controlled.)     Equipment Recommendations  Rolling walker with 5" wheels    Recommendations for Other Services       Precautions / Restrictions Precautions Precautions: Fall    Mobility  Bed Mobility               General bed mobility comments: on BSC with RN when entering the room    Transfers Overall transfer level: Needs assistance Equipment used: Rolling walker (2 wheeled) Transfers: Sit to/from Stand Sit to Stand: Min guard Stand pivot transfers: Min guard          Ambulation/Gait Ambulation/Gait assistance: Min guard Gait Distance (Feet): 60 Feet Assistive device: Rolling walker (2 wheeled) Gait Pattern/deviations: Step-through pattern Gait velocity: decreased   General Gait Details: generally steady.  30' then 80' after seated rest   Stairs             Wheelchair Mobility    Modified Rankin (Stroke Patients Only)       Balance Overall balance assessment: History of Falls;Needs assistance Sitting-balance support: Feet supported Sitting balance-Leahy Scale: Good     Standing balance support: Bilateral upper extremity supported Standing balance-Leahy Scale: Fair                              Cognition  Arousal/Alertness: Awake/alert Behavior During Therapy: WFL for tasks assessed/performed Overall Cognitive Status: Within Functional Limits for tasks assessed                                        Exercises      General Comments        Pertinent Vitals/Pain Pain Assessment: No/denies pain    Home Living Family/patient expects to be discharged to:: Private residence Living Arrangements: Alone Available Help at Discharge: Family;Available PRN/intermittently Type of Home: House Home Access: Stairs to enter Entrance Stairs-Rails: Right Home Layout: One level Home Equipment: Walker - 4 wheels;Walker - standard;Cane - single point;Grab bars - tub/shower;Hand held shower head      Prior Function Level of Independence: Independent with assistive device(s)          PT Goals (current goals can now be found in the care plan section) Acute Rehab PT Goals Patient Stated Goal: To go home. Progress towards PT goals: Progressing toward goals    Frequency    Min 2X/week      PT Plan      Co-evaluation              AM-PAC PT "6 Clicks" Mobility   Outcome Measure  Help needed turning from your back to your side while in  a flat bed without using bedrails?: A Little Help needed moving from lying on your back to sitting on the side of a flat bed without using bedrails?: A Little Help needed moving to and from a bed to a chair (including a wheelchair)?: A Lot Help needed standing up from a chair using your arms (e.g., wheelchair or bedside chair)?: A Lot Help needed to walk in hospital room?: A Lot Help needed climbing 3-5 steps with a railing? : A Lot 6 Click Score: 14    End of Session   Activity Tolerance: Treatment limited secondary to medical complications (Comment) (Systolic BP >200 with low-level exercises.) Patient left: in bed;with call bell/phone within reach Nurse Communication: Other (comment) (Findings of BP.) PT Visit Diagnosis:  Unsteadiness on feet (R26.81);Repeated falls (R29.6);Muscle weakness (generalized) (M62.81);History of falling (Z91.81);Difficulty in walking, not elsewhere classified (R26.2)     Time: 4403-4742 PT Time Calculation (min) (ACUTE ONLY): 19 min  Charges:  $Gait Training: 8-22 mins                    Danielle Dess, PTA 03/18/21, 2:18 PM

## 2021-03-18 NOTE — Progress Notes (Signed)
This nurse spoke with the Pamela Reed who is concerned about a possible discharge on 4/27. [name] states now more family members are Covid positive, and today he became symptomatic (he will get tested tomorrow 4/27 to confirm) . Due to  Covid, the family will  Not have adequate discharge  arrangements at this time as no one is able to sit and care for his mother. [name] is also updated on the room change

## 2021-03-18 NOTE — Progress Notes (Signed)
BS 113

## 2021-03-18 NOTE — Progress Notes (Signed)
   03/18/21 0605  Assess: MEWS Score  Temp (!) 97.5 F (36.4 C)  BP (!) 201/71  Pulse Rate 72  SpO2 95 %  Assess: MEWS Score  MEWS Temp 0  MEWS Systolic 2  MEWS Pulse 0  MEWS RR 0  MEWS LOC 0  MEWS Score 2  MEWS Score Color Yellow  Assess: if the MEWS score is Yellow or Red  Were vital signs taken at a resting state? Yes  Focused Assessment Change from prior assessment (see assessment flowsheet)  Early Detection of Sepsis Score *See Row Information* Low  Treat  MEWS Interventions Administered prn meds/treatments  Pain Scale 0-10  Pain Score 0  Take Vital Signs  Increase Vital Sign Frequency  Yellow: Q 2hr X 2 then Q 4hr X 2, if remains yellow, continue Q 4hrs  Escalate  MEWS: Escalate Yellow: discuss with charge nurse/RN and consider discussing with provider and RRT  Notify: Charge Nurse/RN  Name of Charge Nurse/RN Notified Marcela,RN  Date Charge Nurse/RN Notified 03/18/21  Time Charge Nurse/RN Notified 0608  Document  Patient Outcome Other (Comment)  Progress note created (see row info) Yes

## 2021-03-18 NOTE — Progress Notes (Signed)
Patient room has been changed from 212 to 219 due to call bell defects. This nurse has made Multiple attempts to reach family to update on room change  at [phone] and [phone] have been unsuccessful. No option to leave a VM at this time

## 2021-03-19 LAB — CBC WITH DIFFERENTIAL/PLATELET
Abs Immature Granulocytes: 0.02 10*3/uL (ref 0.00–0.07)
Basophils Absolute: 0 10*3/uL (ref 0.0–0.1)
Basophils Relative: 1 %
Eosinophils Absolute: 0 10*3/uL (ref 0.0–0.5)
Eosinophils Relative: 1 %
HCT: 33.3 % — ABNORMAL LOW (ref 36.0–46.0)
Hemoglobin: 10.6 g/dL — ABNORMAL LOW (ref 12.0–15.0)
Immature Granulocytes: 0 %
Lymphocytes Relative: 39 %
Lymphs Abs: 2.2 10*3/uL (ref 0.7–4.0)
MCH: 26 pg (ref 26.0–34.0)
MCHC: 31.8 g/dL (ref 30.0–36.0)
MCV: 81.6 fL (ref 80.0–100.0)
Monocytes Absolute: 0.8 10*3/uL (ref 0.1–1.0)
Monocytes Relative: 15 %
Neutro Abs: 2.4 10*3/uL (ref 1.7–7.7)
Neutrophils Relative %: 44 %
Platelets: 268 10*3/uL (ref 150–400)
RBC: 4.08 MIL/uL (ref 3.87–5.11)
RDW: 17.4 % — ABNORMAL HIGH (ref 11.5–15.5)
WBC: 5.5 10*3/uL (ref 4.0–10.5)
nRBC: 0 % (ref 0.0–0.2)

## 2021-03-19 LAB — COMPREHENSIVE METABOLIC PANEL
ALT: 15 U/L (ref 0–44)
AST: 26 U/L (ref 15–41)
Albumin: 3.1 g/dL — ABNORMAL LOW (ref 3.5–5.0)
Alkaline Phosphatase: 51 U/L (ref 38–126)
Anion gap: 10 (ref 5–15)
BUN: 15 mg/dL (ref 8–23)
CO2: 23 mmol/L (ref 22–32)
Calcium: 8.7 mg/dL — ABNORMAL LOW (ref 8.9–10.3)
Chloride: 101 mmol/L (ref 98–111)
Creatinine, Ser: 0.89 mg/dL (ref 0.44–1.00)
GFR, Estimated: >60 mL/min (ref >60)
Glucose, Bld: 88 mg/dL (ref 70–99)
Potassium: 3.7 mmol/L (ref 3.5–5.1)
Sodium: 134 mmol/L — ABNORMAL LOW (ref 135–145)
Total Bilirubin: 0.6 mg/dL (ref 0.3–1.2)
Total Protein: 5.9 g/dL — ABNORMAL LOW (ref 6.5–8.1)

## 2021-03-19 LAB — GLUCOSE, CAPILLARY
Glucose-Capillary: 95 mg/dL (ref 70–99)
Glucose-Capillary: 171 mg/dL — ABNORMAL HIGH (ref 70–99)
Glucose-Capillary: 149 mg/dL — ABNORMAL HIGH (ref 70–99)
Glucose-Capillary: 139 mg/dL — ABNORMAL HIGH (ref 70–99)

## 2021-03-19 LAB — C-REACTIVE PROTEIN: CRP: 0.5 mg/dL (ref <1.0)

## 2021-03-19 NOTE — TOC Initial Note (Signed)
Transition of Care [facility]) - Initial/Assessment Note    Patient Details  Name: Pamela Reed MRN: [id number] Date of Birth: 1931-11-08  Transition of Care [facility] CM/SW Contact:    Margarito Liner, LCSW Phone Number: 03/19/2021, 4:03 PM  Clinical Narrative:  Received call back from son. CSW introduced role and explained that PT recommendations would be discussed. He is agreeable to home health. No agency preference. He called patient as well and confirmed she was agreeable to home health services. They have arranged from someone to stay with her tomorrow through Saturday. [facility] is reviewing referral. Likely won't have decision until tomorrow morning.   Expected Discharge Plan: Home w Home Health Services Barriers to Discharge: Continued Medical Work up   Patient Goals and CMS Choice        Expected Discharge Plan and Services Expected Discharge Plan: Home w Home Health Services     Post Acute Care Choice: Home Health Living arrangements for the past 2 months: Single Family Home                                      Prior Living Arrangements/Services Living arrangements for the past 2 months: Single Family Home Lives with:: Self Patient language and need for interpreter reviewed:: Yes Do you feel safe going back to the place where you live?: Yes      Need for Family Participation in Patient Care: Yes (Comment) Care giver support system in place?: Yes (comment) Current home services: DME Criminal Activity/Legal Involvement Pertinent to Current Situation/Hospitalization: No - Comment as needed  Activities of Daily Living Home Assistive Devices/Equipment: None ADL Screening (condition at time of admission) Patient's cognitive ability adequate to safely complete daily activities?: Yes Is the patient deaf or have difficulty hearing?: Yes Does the patient have difficulty seeing, even when wearing glasses/contacts?: Yes (hx glaucoma) Does the patient have  difficulty concentrating, remembering, or making decisions?: No Patient able to express need for assistance with ADLs?: Yes Does the patient have difficulty dressing or bathing?: No Independently performs ADLs?: Yes (appropriate for developmental age) Does the patient have difficulty walking or climbing stairs?: No Weakness of Legs: None Weakness of Arms/Hands: None  Permission Sought/Granted Permission sought to share information with : Facility Contact Representative,Family Supports    Share Information with NAME: Jazminn Pomales  Permission granted to share info w AGENCY: Home Health Agencies  Permission granted to share info w Relationship: Son  Permission granted to share info w Contact Information: [phone]  Emotional Assessment Appearance:: Appears stated age Attitude/Demeanor/Rapport: Unable to Assess Affect (typically observed): Unable to Assess Orientation: : Oriented to Self,Oriented to Place,Oriented to Situation Alcohol / Substance Use: Not Applicable Psych Involvement: No (comment)  Admission diagnosis:  Dehydration [E86.0] Hypokalemia [E87.6] Hyponatremia [E87.1] Generalized weakness [R53.1] COVID-19 virus infection [U07.1] COVID-19 [U07.1] Patient Active Problem List   Diagnosis Date Noted  . COVID-19 virus infection 03/17/2021  . Hyponatremia 03/17/2021  . Hypokalemia 03/17/2021  . Dysphagia 03/17/2021  . HTN (hypertension) 03/17/2021  . Hypothyroidism 03/17/2021  . Anxiety 03/17/2021  . Depression 03/17/2021  . Glaucoma 03/17/2021   PCP:  Alan Mulder, MD Pharmacy:   [facility] - [place], [place] - [address] [place] [address] Phone: [phone] Fax: [phone]     Social Determinants of Health (SDOH) Interventions    Readmission Risk Interventions No flowsheet data  found.  

## 2021-03-19 NOTE — Progress Notes (Signed)
PROGRESS NOTE  Pamela Reed  ZOX:[id number] DOB: [birth year] DOA: 03/16/2021 PCP: Alan Mulder, MD   Brief Narrative: Pamela Samples Montgomeryis a 87 y.o.femalewith medical history significant forhypertension, hypothyroidism, osteopenia, restless leg syndrome, dysphagia who presented to the ED on 03/16/21 with concerns of weakness and fall, in addition to poor appetite.  She reported LLQ abdominal pain that is chronic and being evaluated by GI (CT scan scheduled 4/28).  She had been having cough and recently treated for bouts of bronchitis and COPD flare with steroids and antibiotics as outpatient.  Patient found to be positive for Covid-19 infection with fever of 100.7 F in the ED.  She has only received one dose of COVID vaccine due to adverse GI effects and weakness. Labs notable for Na 130, K 3.0., leukocytosis.  Assessment & Plan: Principal Problem:   COVID-19 virus infection Active Problems:   Hyponatremia   Hypokalemia   Dysphagia   HTN (hypertension)   Hypothyroidism   Anxiety   Depression   Glaucoma  Generalized weakness and fall: Deconditioning worsened by viral infection.  - PT/OT to reevalatute today in light of pt's inability to have 24 hrs assistance at discharge. We therefore do not currently have a safe discharge plan and will keep the patient another day.   COVID-19 viral infection: No infiltrates or hypoxia, CRP 0.5.  - Deferring remdesivir due to son's reluctance. No indication for steroids with no hypoxia (did get solumedrol x1 at admission).  - Continue isolation for 10 days from positive test which was 4/24.  - no respiratory symptoms. ED gave Solu-medrol.  Stopped steroids after admission given no hypoxia. Hyponatremia: Improved with IVF.  Hypokalemia:  - Monitor and replace prn  Accelerated hypertension - POA Hx of Hypertension - - Continue home lisinopril, Lasix - Metoprolol was changed to Coreg   Chronic LLQ abdominal pain and intermittent  diarrhea: CT abd/pelvis performed while inpatient (was scheduled as outpatient 4/28) and shows no significant etiology.  - Plans to follow up with GI, Dr. Norma Fredrickson of GI. Symptoms currently improved. ?if IBS.   History of esophageal stricture, dysphagia:  - EGD planned per GI - Dysphagia diet recommended per SLP.   Hypothyroidism:  - Continue synthroid  Anxiety/depression:  - Continue prozac and prn xanax  Glaucoma:  - Continue home gtt's  DVT prophylaxis: Lovenox Code Status: Full Family Communication: None at bedside Disposition Plan:  Status is: Inpatient  Remains inpatient appropriate because:Unsafe d/c plan  Dispo: The patient is from: Home              Anticipated d/c is to: Home              Patient currently is not medically stable to d/c.   Difficult to place patient No  Consultants:   None  Procedures:   None  Antimicrobials:  None   Subjective: Slept for the first time in days, reports feeling unsteady on her feet. Denies shortness of breath but more difficult to get around due to easily tiring. No chest or other pain.   Objective: Vitals:   03/18/21 1500 03/18/21 2013 03/19/21 0559 03/19/21 0826  BP: (!) 110/30 (!) 157/64 (!) 168/60 (!) 151/77  Pulse: 74 66 80 86  Resp: (!) 22  18   Temp: 97.6 F (36.4 C)  98.3 F (36.8 C) 98.1 F (36.7 C)  TempSrc: Oral     SpO2: 95% 95% 95% 93%  Weight:      Height:  Intake/Output Summary (Last 24 hours) at 03/19/2021 1044 Last data filed at 03/19/2021 6979 Gross per 24 hour  Intake 0 ml  Output 200 ml  Net -200 ml   Filed Weights   03/17/21 0130  Weight: 75.8 kg    Gen: Elderly female, frail but in no distress.  Pulm: Non-labored breathing. Crackles are noted bilaterally with good aeration. CV: Regular rate and rhythm. No murmur, rub, or gallop. No JVD, no pitting pedal edema. GI: Abdomen soft, non-tender, non-distended, with normoactive bowel sounds. No organomegaly or masses felt. Ext:  Warm, no deformities Skin: No rashes, lesions or ulcers Neuro: Alert and oriented. HOH. No focal neurological deficits. Psych: Judgement and insight appear normal. Mood & affect appropriate.   Data Reviewed: I have personally reviewed following labs and imaging studies  CBC: Recent Labs  Lab 03/16/21 2242 03/18/21 0545 03/19/21 0509  WBC 5.1 7.4 5.5  NEUTROABS 3.1 3.5 2.4  HGB 10.1* 10.9* 10.6*  HCT 31.2* 33.9* 33.3*  MCV 81.3 80.5 81.6  PLT 290 297 268   Basic Metabolic Panel: Recent Labs  Lab 03/16/21 2242 03/17/21 1019 03/18/21 0545 03/19/21 0509  NA 130* 130* 136 134*  K 3.0* 3.0* 3.4* 3.7  CL 96* 98 102 101  CO2 24 22 24 23   GLUCOSE 111* 263* 95 88  BUN 10 11 17 15   CREATININE 0.73 0.91 0.89 0.89  CALCIUM 8.9 8.7* 9.1 8.7*   GFR: Estimated Creatinine Clearance: 43.4 mL/min (by C-G formula based on SCr of 0.89 mg/dL). Liver Function Tests: Recent Labs  Lab 03/16/21 2242 03/18/21 0545 03/19/21 0509  AST 26 32 26  ALT 14 18 15   ALKPHOS 62 60 51  BILITOT 0.8 0.7 0.6  PROT 6.6 6.8 5.9*  ALBUMIN 3.4* 3.4* 3.1*   No results for input(s): LIPASE, AMYLASE in the last 168 hours. No results for input(s): AMMONIA in the last 168 hours. Coagulation Profile: No results for input(s): INR, PROTIME in the last 168 hours. Cardiac Enzymes: No results for input(s): CKTOTAL, CKMB, CKMBINDEX, TROPONINI in the last 168 hours. BNP (last 3 results) No results for input(s): PROBNP in the last 8760 hours. HbA1C: Recent Labs    03/17/21 1629  HGBA1C 5.8*   CBG: Recent Labs  Lab 03/18/21 0752 03/18/21 1205 03/18/21 1709 03/18/21 2015 03/19/21 0758  GLUCAP 99 118* 118* 98 95   Lipid Profile: No results for input(s): CHOL, HDL, LDLCALC, TRIG, CHOLHDL, LDLDIRECT in the last 72 hours. Thyroid Function Tests: Recent Labs    03/17/21 1019  TSH 1.063   Anemia Panel: Recent Labs    03/17/21 0122  FERRITIN 27   Urine analysis:    Component Value Date/Time    COLORURINE YELLOW (A) 03/16/2021 2242   APPEARANCEUR CLEAR (A) 03/16/2021 2242   APPEARANCEUR Hazy (A) 10/04/2019 1315   LABSPEC 1.014 03/16/2021 2242   PHURINE 7.0 03/16/2021 2242   GLUCOSEU NEGATIVE 03/16/2021 2242   HGBUR NEGATIVE 03/16/2021 2242   BILIRUBINUR NEGATIVE 03/16/2021 2242   BILIRUBINUR Negative 10/04/2019 1315   KETONESUR 20 (A) 03/16/2021 2242   PROTEINUR NEGATIVE 03/16/2021 2242   NITRITE NEGATIVE 03/16/2021 2242   LEUKOCYTESUR NEGATIVE 03/16/2021 2242   Recent Results (from the past 240 hour(s))  Blood culture (routine x 2)     Status: None (Preliminary result)   Collection Time: 03/16/21 10:39 PM   Specimen: BLOOD  Result Value Ref Range Status   Specimen Description BLOOD BLOOD RIGHT FOREARM  Final   Special Requests   Final  BOTTLES DRAWN AEROBIC AND ANAEROBIC Blood Culture results may not be optimal due to an excessive volume of blood received in culture bottles   Culture   Final    NO GROWTH 3 DAYS Performed at [facility], [address] [place], [place] [address]    Report Status PENDING  Incomplete  Urine culture     Status: None   Collection Time: 03/16/21 10:42 PM   Specimen: Urine, Random  Result Value Ref Range Status   Specimen Description   Final    URINE, RANDOM Performed at [facility], [address] [place], [place] [address]    Special Requests   Final    NONE Performed at [facility], [address] [place], [place] [address]    Culture   Final    NO GROWTH Performed at [facility], [address] [place], [place] [address]    Report Status 03/18/2021 FINAL  Final  Blood culture (routine x 2)     Status: None (Preliminary result)   Collection Time: 03/16/21 10:42 PM   Specimen: BLOOD  Result Value Ref Range Status   Specimen Description BLOOD BLOOD LEFT FOREARM  Final   Special Requests   Final    BOTTLES DRAWN AEROBIC AND ANAEROBIC Blood Culture adequate volume   Culture   Final     NO GROWTH 3 DAYS Performed at [facility], [address] [place], [place] [address]    Report Status PENDING  Incomplete  Resp Panel by RT-PCR (Flu A&B, Covid) Nasopharyngeal Swab     Status: Abnormal   Collection Time: 03/16/21 11:27 PM   Specimen: Nasopharyngeal Swab; Nasopharyngeal(NP) swabs in vial transport medium  Result Value Ref Range Status   SARS Coronavirus 2 by RT PCR POSITIVE (A) NEGATIVE Final    Comment: RESULT CALLED TO, READ BACK BY AND VERIFIED WITH: NOTIFIED KELLEY PENDLETON RN 03/17/2021 0022 JG (NOTE) SARS-CoV-2 target nucleic acids are DETECTED.  The SARS-CoV-2 RNA is generally detectable in upper respiratory specimens during the acute phase of infection. Positive results are indicative of the presence of the identified virus, but do not rule out bacterial infection or co-infection with other pathogens not detected by the test. Clinical correlation with patient history and other diagnostic information is necessary to determine patient infection status. The expected result is Negative.  Fact Sheet for Patients: BloggerCourse.comhttps://www.fda.gov/media/152166/download  Fact Sheet for Healthcare Providers: SeriousBroker.ithttps://www.fda.gov/media/152162/download  This test is not yet approved or cleared by the [place] FDA and  has been authorized for detection and/or diagnosis of SARS-CoV-2 by FDA under an Emergency Use Authorization (EUA).  This EUA will remain in effect (meaning thi s test can be used) for the duration of  the COVID-19 declaration under Section 564(b)(1) of the Act, 21 U.S.C. section 360bbb-3(b)(1), unless the authorization is terminated or revoked sooner.     Influenza A by PCR NEGATIVE NEGATIVE Final   Influenza B by PCR NEGATIVE NEGATIVE Final    Comment: (NOTE) The Xpert Xpress SARS-CoV-2/FLU/RSV plus assay is intended as an aid in the diagnosis of influenza from Nasopharyngeal swab specimens and should not be used as a sole basis for  treatment. Nasal washings and aspirates are unacceptable for Xpert Xpress SARS-CoV-2/FLU/RSV testing.  Fact Sheet for Patients: BloggerCourse.comhttps://www.fda.gov/media/152166/download  Fact Sheet for Healthcare Providers: SeriousBroker.ithttps://www.fda.gov/media/152162/download  This test is not yet approved or cleared by the [place] FDA and has been authorized for detection and/or diagnosis of SARS-CoV-2 by FDA under an Emergency  Use Authorization (EUA). This EUA will remain in effect (meaning this test can be used) for the duration of the COVID-19 declaration under Section 564(b)(1) of the Act, 21 U.S.C. section 360bbb-3(b)(1), unless the authorization is terminated or revoked.  Performed at [facility], [address], [place], [place] [address]       Radiology Studies: CT ABDOMEN PELVIS WO CONTRAST  Result Date: 03/18/2021 CLINICAL DATA:  Diarrhea. Admitted overnight with COVID-19 infection. Progressive generalized weakness. Left lower quadrant pain EXAM: CT ABDOMEN AND PELVIS WITHOUT CONTRAST TECHNIQUE: Multidetector CT imaging of the abdomen and pelvis was performed following the standard protocol without IV contrast. COMPARISON:  02/06/2005 FINDINGS: Lower chest: Bilateral patchy interstitial and ground-glass densities are identified. These are new from previous exam and are consistent with the recent diagnosis of COVID infection. No pleural effusion. Hepatobiliary: No focal liver abnormality is seen. Status post cholecystectomy. No biliary dilatation. Pancreas: Unremarkable. No pancreatic ductal dilatation or surrounding inflammatory changes. Spleen: Normal in size without focal abnormality. Adrenals/Urinary Tract: Normal adrenal glands. Bilateral renal cortical lobulation and thinning noted. No kidney stone, mass or hydronephrosis identified bilaterally. Bladder appears normal. Stomach/Bowel: Moderate to large hiatal hernia. No bowel wall thickening, inflammation, or distension. The appendix  is not confidently identified. No secondary signs of acute appendicitis however. Vascular/Lymphatic: Aortic atherosclerosis. No aneurysm. No abdominopelvic adenopathy. Reproductive: Status post hysterectomy. Small cyst in the right adnexa measures 2 cm, image 64/2. Other: No free fluid or fluid collections identified within the abdomen or pelvis. Musculoskeletal: Degenerative disc disease identified within the thoracolumbar spine. No acute or suspicious osseous findings IMPRESSION: 1. No acute findings identified within the abdomen or pelvis. 2. Bilateral patchy interstitial and ground-glass densities are identified within the lung bases compatible with the recent diagnosis of COVID infection. 3. Hiatal hernia. 4. Aortic atherosclerosis. 5. Right ovary cyst measures 2 cm. No follow-up imaging recommended. Note: This recommendation does not apply to premenarchal patients and to those with increased risk (genetic, family history, elevated tumor markers or other high-risk factors) of ovarian cancer. Reference: JACR 2020 Feb; 17(2):248-254 Aortic Atherosclerosis (ICD10-I70.0). Electronically Signed   By: Signa Kell M.D.   On: 03/18/2021 16:29    Scheduled Meds: . aspirin EC  81 mg Oral Daily  . carvedilol  12.5 mg Oral BID WC  . enoxaparin (LOVENOX) injection  40 mg Subcutaneous Q24H  . feeding supplement  237 mL Oral BID BM  . FLUoxetine  10 mg Oral Daily  . furosemide  20 mg Oral Daily  . insulin aspart  0-9 Units Subcutaneous TID WC  . Latanoprostene Bunod  1 drop Both Eyes QHS  . levothyroxine  50 mcg Oral QAC breakfast  . lisinopril  20 mg Oral BID  . magnesium oxide  400 mg Oral Daily  . melatonin  5 mg Oral QHS  . mometasone-formoterol  2 puff Inhalation BID  . Netarsudil Dimesylate  1 drop Both Eyes QHS   Continuous Infusions:   LOS: 2 days   Time spent: 25 minutes.  Tyrone Nine, MD [facility] [email or web address] 03/19/2021, 10:44 AM

## 2021-03-19 NOTE — Progress Notes (Signed)
Physical Therapy Treatment Patient Details Name: Pamela Reed MRN: [id number] DOB: 15-Dec-1930 Today's Date: 03/19/2021    History of Present Illness Pamela Reed is a 87 y.o. female with medical history significant for hypertension, hypothyroidism, osteopenia, restless leg syndrome, dysphagia who presents with concerns of weakness and fall.    PT Comments    Pt was sitting in recliner upon arriving. Agrees to PT session and is cooperative and pleasant throughout. Pt states, " I'm ready to go home tomorrow." Throughout session pt demonstrated safe abilities to perform desired task. Stood and ambulated with and without ADF. Recommend use of rollator at home with HHPT to follow. Pt was in bed, post session, with call bell in reach and pt resting comfortably.    Follow Up Recommendations  Home health PT;Other (comment) (BP 133/88)     Equipment Recommendations  Rolling walker with 5" wheels       Precautions / Restrictions Precautions Precautions: Fall    Mobility  Bed Mobility Overal bed mobility: Modified Independent  General bed mobility comments: Pt was able to progress back to supine in bed without cues or physical assistance    Transfers Overall transfer level: Needs assistance Equipment used: Rolling walker (2 wheeled);None Transfers: Sit to/from Stand Sit to Stand: Min guard;Modified independent (Device/Increase time)         General transfer comment: pt stood to RW without assistance or cueing. CGA for STS without use of AD.  Ambulation/Gait Ambulation/Gait assistance: Supervision Gait Distance (Feet): 100 Feet Assistive device: Rolling walker (2 wheeled) Gait Pattern/deviations: Step-through pattern Gait velocity: decreased   General Gait Details: pt ambulated 100 ft total. 50 with RW with supervision and 50 without AD with CGA. recommend use of RW at DC however pt states she prefers use of rollator.       Balance Overall balance assessment: History  of Falls;Needs assistance Sitting-balance support: Feet supported Sitting balance-Leahy Scale: Good     Standing balance support: Bilateral upper extremity supported Standing balance-Leahy Scale: Good      Cognition Arousal/Alertness: Awake/alert Behavior During Therapy: WFL for tasks assessed/performed Overall Cognitive Status: Within Functional Limits for tasks assessed      General Comments: Pt is A and oriented x 3. consistently able to follow commands and motivated to DC home tomorrow.             Pertinent Vitals/Pain Pain Assessment: No/denies pain           PT Goals (current goals can now be found in the care plan section) Acute Rehab PT Goals Patient Stated Goal: To go home. Progress towards PT goals: Progressing toward goals    Frequency    Min 2X/week      PT Plan Current plan remains appropriate       AM-PAC PT "6 Clicks" Mobility   Outcome Measure  Help needed turning from your back to your side while in a flat bed without using bedrails?: None Help needed moving from lying on your back to sitting on the side of a flat bed without using bedrails?: None Help needed moving to and from a bed to a chair (including a wheelchair)?: A Little Help needed standing up from a chair using your arms (e.g., wheelchair or bedside chair)?: A Little Help needed to walk in hospital room?: A Little Help needed climbing 3-5 steps with a railing? : A Little 6 Click Score: 20    End of Session Equipment Utilized During Treatment: Gait belt Activity Tolerance: Patient tolerated  treatment well Patient left: in bed;with call bell/phone within reach Nurse Communication: Mobility status PT Visit Diagnosis: Unsteadiness on feet (R26.81);Repeated falls (R29.6);Muscle weakness (generalized) (M62.81);History of falling (Z91.81);Difficulty in walking, not elsewhere classified (R26.2)     Time: 3875-6433 PT Time Calculation (min) (ACUTE ONLY): 15 min  Charges:  $Gait  Training: 8-22 mins                     Jetta Lout PTA 03/19/21, 2:52 PM

## 2021-03-19 NOTE — Clinical Social Work Note (Signed)
PT re-evaluated today and are recommending home health. Left voicemail for son. Will discuss recommendations when he calls back  Charlynn Court, CSW [phone]

## 2021-03-20 ENCOUNTER — Ambulatory Visit: Admission: RE | Admit: 2021-03-20 | Payer: Medicare HMO | Source: Ambulatory Visit

## 2021-03-20 LAB — COMPREHENSIVE METABOLIC PANEL
ALT: 15 U/L (ref 0–44)
AST: 23 U/L (ref 15–41)
Albumin: 3 g/dL — ABNORMAL LOW (ref 3.5–5.0)
Alkaline Phosphatase: 49 U/L (ref 38–126)
Anion gap: 9 (ref 5–15)
BUN: 17 mg/dL (ref 8–23)
CO2: 26 mmol/L (ref 22–32)
Calcium: 8.9 mg/dL (ref 8.9–10.3)
Chloride: 97 mmol/L — ABNORMAL LOW (ref 98–111)
Creatinine, Ser: 0.91 mg/dL (ref 0.44–1.00)
GFR, Estimated: >60 mL/min (ref >60)
Glucose, Bld: 97 mg/dL (ref 70–99)
Potassium: 3.6 mmol/L (ref 3.5–5.1)
Sodium: 132 mmol/L — ABNORMAL LOW (ref 135–145)
Total Bilirubin: 0.7 mg/dL (ref 0.3–1.2)
Total Protein: 5.9 g/dL — ABNORMAL LOW (ref 6.5–8.1)

## 2021-03-20 LAB — CBC WITH DIFFERENTIAL/PLATELET
Abs Immature Granulocytes: 0.01 10*3/uL (ref 0.00–0.07)
Basophils Absolute: 0 10*3/uL (ref 0.0–0.1)
Basophils Relative: 0 %
Eosinophils Absolute: 0 10*3/uL (ref 0.0–0.5)
Eosinophils Relative: 1 %
HCT: 31.6 % — ABNORMAL LOW (ref 36.0–46.0)
Hemoglobin: 9.9 g/dL — ABNORMAL LOW (ref 12.0–15.0)
Immature Granulocytes: 0 %
Lymphocytes Relative: 53 %
Lymphs Abs: 2.2 10*3/uL (ref 0.7–4.0)
MCH: 25.6 pg — ABNORMAL LOW (ref 26.0–34.0)
MCHC: 31.3 g/dL (ref 30.0–36.0)
MCV: 81.7 fL (ref 80.0–100.0)
Monocytes Absolute: 0.6 10*3/uL (ref 0.1–1.0)
Monocytes Relative: 15 %
Neutro Abs: 1.3 10*3/uL — ABNORMAL LOW (ref 1.7–7.7)
Neutrophils Relative %: 31 %
Platelets: 255 10*3/uL (ref 150–400)
RBC: 3.87 MIL/uL (ref 3.87–5.11)
RDW: 17.3 % — ABNORMAL HIGH (ref 11.5–15.5)
WBC: 4.2 10*3/uL (ref 4.0–10.5)
nRBC: 0 % (ref 0.0–0.2)

## 2021-03-20 LAB — GLUCOSE, CAPILLARY
Glucose-Capillary: 106 mg/dL — ABNORMAL HIGH (ref 70–99)
Glucose-Capillary: 154 mg/dL — ABNORMAL HIGH (ref 70–99)

## 2021-03-20 LAB — C-REACTIVE PROTEIN: CRP: 0.5 mg/dL (ref <1.0)

## 2021-03-20 MED ORDER — ONDANSETRON HCL 4 MG PO TABS: 4.0000 mg | ORAL_TABLET | Freq: Three times a day (TID) | ORAL | 0 refills | Status: DC | PRN

## 2021-03-20 NOTE — Discharge Summary (Signed)
Physician Discharge Summary  Pamela Reed:[id number] DOB: 06-Jan-1931 DOA: 03/16/2021  PCP: Alan Mulder, MD  Admit date: 03/16/2021 Discharge date: 03/20/2021  Admitted From: Home Disposition: Home   Recommendations for Outpatient Follow-up:  1. Follow up with PCP in 1-2 weeks 2. Please obtain BMP/CBC in one week 3. Follow up with GI as previously planned  Home Health: PT, OT, CSW Equipment/Devices: RW Discharge Condition: Stable CODE STATUS: Full Diet recommendation: Heart healthy, dysphagia diet  Brief/Interim Summary: Pamela Loiseau Montgomeryis an 87 y.o.femalewith medical history significant forhypertension, hypothyroidism, osteopenia, restless leg syndrome, dysphagia who presented to the ED on 03/16/21 with concerns of weakness and fall, in addition to poor appetite. She reported LLQ abdominal pain that is chronic and being evaluated by GI (CT scan scheduled 4/28). She had been having cough and recently treated for bouts of bronchitis and COPD flare with steroids and antibiotics as outpatient.  Patient found to be positive for Covid-19 infection with fever of 100.7 F in the ED. She has only received one dose of COVID vaccine due to adverse GI effects and weakness. Labs notable for Na 130, K 3.0., leukocytosis.  Discharge Diagnoses:  Principal Problem:   COVID-19 virus infection Active Problems:   Hyponatremia   Hypokalemia   Dysphagia   HTN (hypertension)   Hypothyroidism   Anxiety   Depression   Glaucoma  Generalized weakness and fall: Deconditioning worsened by viral infection.  - PT/OT feel patient is stable for home at DC. Assistance has been arranged by the family.  COVID-19 viral infection: No infiltrates or hypoxia, CRP 0.5.  - Deferred remdesivir. No indication for steroids with no hypoxia (did get solumedrol x1 at admission).  - Continue isolation for 10 days from positive test which was 4/24.  Hyponatremia: Improved with IVF, tolerating enough  po intake at discharge. Will give zofran if needed.  Hypokalemia:  - Monitor at follow up and replace prn  Accelerated hypertension - POA Hx of Hypertension - - Continue home medications  Chronic LLQ abdominal pain and intermittent diarrhea: CT abd/pelvis performed while inpatient (was scheduled as outpatient 4/28) and shows no significant etiology.  - Plans to follow up with GI, Dr. Norma Fredrickson of GI. Symptoms currently improved. ?if IBS.   History of esophageal stricture, dysphagia:  - EGD planned per GI - Dysphagia diet recommended per SLP.   Hypothyroidism:  - Continue synthroid  Anxiety/depression:  - Continue prozac and prn xanax  Glaucoma:  - Continue home gtt's   Discharge Instructions Discharge Instructions    Diet - low sodium heart healthy   Complete by: As directed    Discharge instructions   Complete by: As directed    You are stable for discharge after management of weakness related to covid infection. No specific treatment is required at home for covid at this time, though you will need physical therapy in the home to assist you in getting stronger and staying safe. You need to eat and drink enough to stay hydrated and nourished. You can take zofran as needed for nausea to assist with this. If you are unable to tolerate fluids or you develop shortness of breath or worsening weakness, seek medical attention right away.  - Follow up with your doctor in the next week via telehealth or seek medical attention right away if your symptoms get WORSE.  - Stay home for at least 5 days from your positive test and isolate from others in your home. Wear a well-fitted mask if you must  be around others in your home. - End isolation after 5 full days if you are fever-free for 24 hours (without the use of fever-reducing medication) and your symptoms are improving. - Take precautions until day 10: Wear a well-fitted mask for 10 full days any time you are around others inside your  home or in public. Do not go to places where you are unable to wear a mask. Avoid travel. Avoid being around people who are at high risk   Increase activity slowly   Complete by: As directed    MyChart COVID-19 home monitoring program   Complete by: Mar 20, 2021    Is the patient willing to use the MyChart Mobile App for home monitoring?: Yes     Allergies as of 03/20/2021      Reactions   Contrast Media [iodinated Diagnostic Agents] Shortness Of Breath   Iodine Shortness Of Breath   Sulfasalazine Itching, Swelling   Dye Fdc Red [red Dye]    Lactose Intolerance (gi)    GI upset   Penicillins    Childhood event-unknown reaction   Shellfish Allergy Nausea Only, Swelling   Throat swelling   Sulfa Antibiotics Itching, Swelling   Venlafaxine Swelling   Throat   Codeine Palpitations   Ropinirole Nausea Only      Medication List    TAKE these medications   albuterol 108 (90 Base) MCG/ACT inhaler Commonly known as: VENTOLIN HFA Inhale 2 puffs into the lungs every 6 (six) hours as needed.   ALPRAZolam 0.25 MG tablet Commonly known as: XANAX Take 0.25 mg by mouth 2 (two) times daily as needed.   aspirin 81 MG tablet Take 81 mg by mouth daily.   FLUoxetine 10 MG tablet Commonly known as: PROZAC Take 10 mg by mouth daily.   furosemide 20 MG tablet Commonly known as: LASIX Take 20 mg by mouth daily.   levothyroxine 50 MCG tablet Commonly known as: SYNTHROID Take 50 mcg by mouth daily before breakfast.   lisinopril 20 MG tablet Commonly known as: ZESTRIL Take 20 mg by mouth 2 (two) times daily.   Magnesium 500 MG Tabs Take 500 mg by mouth daily.   metoprolol succinate 25 MG 24 hr tablet Commonly known as: TOPROL-XL Take 25 mg by mouth 2 (two) times daily.   omeprazole 20 MG tablet Commonly known as: PRILOSEC OTC Take 20 mg by mouth daily as needed (heartburn).   ondansetron 4 MG tablet Commonly known as: Zofran Take 1 tablet (4 mg total) by mouth every 8 (eight)  hours as needed for nausea or vomiting. What changed: when to take this   Rhopressa 0.02 % Soln Generic drug: Netarsudil Dimesylate Place 1 drop into both eyes at bedtime.   Symbicort 80-4.5 MCG/ACT inhaler Generic drug: budesonide-formoterol Inhale into the lungs.   Vyzulta 0.024 % Soln Generic drug: Latanoprostene Bunod Place 1 drop into both eyes at bedtime.            Durable Medical Equipment  (From admission, onward)         Start     Ordered   03/20/21 0752  For home use only DME Walker rolling  Once       Question Answer Comment  Walker: With 5 Inch Wheels   Patient needs a walker to treat with the following condition Gait instability      03/20/21 0752          Follow-up Information    Morayati, Delsa Sale, MD.  Specialty: Endocrinology Contact information: [address] [place] [address] [phone]              Allergies  Allergen Reactions  . Contrast Media [Iodinated Diagnostic Agents] Shortness Of Breath  . Iodine Shortness Of Breath  . Sulfasalazine Itching and Swelling  . Dye Fdc Red [Red Dye]   . Lactose Intolerance (Gi)     GI upset  . Penicillins     Childhood event-unknown reaction  . Shellfish Allergy Nausea Only and Swelling    Throat swelling  . Sulfa Antibiotics Itching and Swelling  . Venlafaxine Swelling    Throat  . Codeine Palpitations  . Ropinirole Nausea Only    Consultations:  None  Procedures/Studies: CT ABDOMEN PELVIS WO CONTRAST  Result Date: 03/18/2021 CLINICAL DATA:  Diarrhea. Admitted overnight with COVID-19 infection. Progressive generalized weakness. Left lower quadrant pain EXAM: CT ABDOMEN AND PELVIS WITHOUT CONTRAST TECHNIQUE: Multidetector CT imaging of the abdomen and pelvis was performed following the standard protocol without IV contrast. COMPARISON:  02/06/2005 FINDINGS: Lower chest: Bilateral patchy interstitial and ground-glass densities are identified. These are new from previous exam  and are consistent with the recent diagnosis of COVID infection. No pleural effusion. Hepatobiliary: No focal liver abnormality is seen. Status post cholecystectomy. No biliary dilatation. Pancreas: Unremarkable. No pancreatic ductal dilatation or surrounding inflammatory changes. Spleen: Normal in size without focal abnormality. Adrenals/Urinary Tract: Normal adrenal glands. Bilateral renal cortical lobulation and thinning noted. No kidney stone, mass or hydronephrosis identified bilaterally. Bladder appears normal. Stomach/Bowel: Moderate to large hiatal hernia. No bowel wall thickening, inflammation, or distension. The appendix is not confidently identified. No secondary signs of acute appendicitis however. Vascular/Lymphatic: Aortic atherosclerosis. No aneurysm. No abdominopelvic adenopathy. Reproductive: Status post hysterectomy. Small cyst in the right adnexa measures 2 cm, image 64/2. Other: No free fluid or fluid collections identified within the abdomen or pelvis. Musculoskeletal: Degenerative disc disease identified within the thoracolumbar spine. No acute or suspicious osseous findings IMPRESSION: 1. No acute findings identified within the abdomen or pelvis. 2. Bilateral patchy interstitial and ground-glass densities are identified within the lung bases compatible with the recent diagnosis of COVID infection. 3. Hiatal hernia. 4. Aortic atherosclerosis. 5. Right ovary cyst measures 2 cm. No follow-up imaging recommended. Note: This recommendation does not apply to premenarchal patients and to those with increased risk (genetic, family history, elevated tumor markers or other high-risk factors) of ovarian cancer. Reference: JACR 2020 Feb; 17(2):248-254 Aortic Atherosclerosis (ICD10-I70.0). Electronically Signed   By: Signa Kell M.D.   On: 03/18/2021 16:29   DG Chest Portable 1 View  Result Date: 03/16/2021 CLINICAL DATA:  Sepsis. History of hypertension and gastroesophageal reflux disease. EXAM:  PORTABLE CHEST 1 VIEW COMPARISON:  08/03/2016 FINDINGS: Cardiac enlargement with interstitial changes in the lungs likely due to edema. No definite pleural effusions or pneumothorax. Mediastinal contours appear intact. Calcified and tortuous aorta. IMPRESSION: Cardiac enlargement with interstitial edema. Electronically Signed   By: Burman Nieves M.D.   On: 03/16/2021 23:29     Subjective: Feels well, wants to go home. No dyspnea. Tolerating diet with zofran. No vomiting.   Discharge Exam: Vitals:   03/20/21 0547 03/20/21 0847  BP: (!) 145/63 127/64  Pulse: 75 80  Resp: 18 18  Temp: 98.1 F (36.7 C) (!) 97.3 F (36.3 C)  SpO2: 93% 91%   General: Pt is alert, awake, not in acute distress Cardiovascular: RRR, S1/S2 +, no rubs, no gallops Respiratory: CTA bilaterally, no wheezing, no rhonchi Abdominal:  Soft, NT, ND, bowel sounds + Extremities: No edema, no cyanosis  Labs: BNP (last 3 results) Recent Labs    03/16/21 2242 03/17/21 1019  BNP 459.2* 534.1*   Basic Metabolic Panel: Recent Labs  Lab 03/16/21 2242 03/17/21 1019 03/18/21 0545 03/19/21 0509 03/20/21 0458  NA 130* 130* 136 134* 132*  K 3.0* 3.0* 3.4* 3.7 3.6  CL 96* 98 102 101 97*  CO2 24 22 24 23 26   GLUCOSE 111* 263* 95 88 97  BUN 10 11 17 15 17   CREATININE 0.73 0.91 0.89 0.89 0.91  CALCIUM 8.9 8.7* 9.1 8.7* 8.9   Liver Function Tests: Recent Labs  Lab 03/16/21 2242 03/18/21 0545 03/19/21 0509 03/20/21 0458  AST 26 32 26 23  ALT 14 18 15 15   ALKPHOS 62 60 51 49  BILITOT 0.8 0.7 0.6 0.7  PROT 6.6 6.8 5.9* 5.9*  ALBUMIN 3.4* 3.4* 3.1* 3.0*   No results for input(s): LIPASE, AMYLASE in the last 168 hours. No results for input(s): AMMONIA in the last 168 hours. CBC: Recent Labs  Lab 03/16/21 2242 03/18/21 0545 03/19/21 0509 03/20/21 0458  WBC 5.1 7.4 5.5 4.2  NEUTROABS 3.1 3.5 2.4 1.3*  HGB 10.1* 10.9* 10.6* 9.9*  HCT 31.2* 33.9* 33.3* 31.6*  MCV 81.3 80.5 81.6 81.7  PLT 290 297 268  255   Cardiac Enzymes: No results for input(s): CKTOTAL, CKMB, CKMBINDEX, TROPONINI in the last 168 hours. BNP: Invalid input(s): POCBNP CBG: Recent Labs  Lab 03/19/21 1155 03/19/21 1647 03/19/21 2114 03/20/21 0808 03/20/21 1210  GLUCAP 171* 149* 139* 106* 154*   D-Dimer No results for input(s): DDIMER in the last 72 hours. Hgb A1c No results for input(s): HGBA1C in the last 72 hours. Lipid Profile No results for input(s): CHOL, HDL, LDLCALC, TRIG, CHOLHDL, LDLDIRECT in the last 72 hours. Thyroid function studies No results for input(s): TSH, T4TOTAL, T3FREE, THYROIDAB in the last 72 hours.  Invalid input(s): FREET3 Anemia work up No results for input(s): VITAMINB12, FOLATE, FERRITIN, TIBC, IRON, RETICCTPCT in the last 72 hours. Urinalysis    Component Value Date/Time   COLORURINE YELLOW (A) 03/16/2021 2242   APPEARANCEUR CLEAR (A) 03/16/2021 2242   APPEARANCEUR Hazy (A) 10/04/2019 1315   LABSPEC 1.014 03/16/2021 2242   PHURINE 7.0 03/16/2021 2242   GLUCOSEU NEGATIVE 03/16/2021 2242   HGBUR NEGATIVE 03/16/2021 2242   BILIRUBINUR NEGATIVE 03/16/2021 2242   BILIRUBINUR Negative 10/04/2019 1315   KETONESUR 20 (A) 03/16/2021 2242   PROTEINUR NEGATIVE 03/16/2021 2242   NITRITE NEGATIVE 03/16/2021 2242   LEUKOCYTESUR NEGATIVE 03/16/2021 2242    Microbiology Recent Results (from the past 240 hour(s))  Blood culture (routine x 2)     Status: None (Preliminary result)   Collection Time: 03/16/21 10:39 PM   Specimen: BLOOD  Result Value Ref Range Status   Specimen Description BLOOD BLOOD RIGHT FOREARM  Final   Special Requests   Final    BOTTLES DRAWN AEROBIC AND ANAEROBIC Blood Culture results may not be optimal due to an excessive volume of blood received in culture bottles   Culture   Final    NO GROWTH 3 DAYS Performed at [facility], [address] [place], [place] [address]    Report Status PENDING  Incomplete  Urine culture     Status: None    Collection Time: 03/16/21 10:42 PM   Specimen: Urine, Random  Result Value Ref Range Status   Specimen Description   Final    URINE,  RANDOM Performed at [facility], [address], [place], [place] [address]    Special Requests   Final    NONE Performed at [facility], [address], [place], [place] [address]    Culture   Final    NO GROWTH Performed at [facility], [address] [place], [place] [address]    Report Status 03/18/2021 FINAL  Final  Blood culture (routine x 2)     Status: None (Preliminary result)   Collection Time: 03/16/21 10:42 PM   Specimen: BLOOD  Result Value Ref Range Status   Specimen Description BLOOD BLOOD LEFT FOREARM  Final   Special Requests   Final    BOTTLES DRAWN AEROBIC AND ANAEROBIC Blood Culture adequate volume   Culture   Final    NO GROWTH 3 DAYS Performed at [facility], [address] [place], [place] [address]    Report Status PENDING  Incomplete  Resp Panel by RT-PCR (Flu A&B, Covid) Nasopharyngeal Swab     Status: Abnormal   Collection Time: 03/16/21 11:27 PM   Specimen: Nasopharyngeal Swab; Nasopharyngeal(NP) swabs in vial transport medium  Result Value Ref Range Status   SARS Coronavirus 2 by RT PCR POSITIVE (A) NEGATIVE Final    Comment: RESULT CALLED TO, READ BACK BY AND VERIFIED WITH: NOTIFIED KELLEY PENDLETON RN 03/17/2021 0022 JG (NOTE) SARS-CoV-2 target nucleic acids are DETECTED.  The SARS-CoV-2 RNA is generally detectable in upper respiratory specimens during the acute phase of infection. Positive results are indicative of the presence of the identified virus, but do not rule out bacterial infection or co-infection with other pathogens not detected by the test. Clinical correlation with patient history and other diagnostic information is necessary to determine patient infection status. The expected result is Negative.  Fact Sheet for  Patients: BloggerCourse.com  Fact Sheet for Healthcare Providers: SeriousBroker.it  This test is not yet approved or cleared by the [place] FDA and  has been authorized for detection and/or diagnosis of SARS-CoV-2 by FDA under an Emergency Use Authorization (EUA).  This EUA will remain in effect (meaning thi s test can be used) for the duration of  the COVID-19 declaration under Section 564(b)(1) of the Act, 21 U.S.C. section 360bbb-3(b)(1), unless the authorization is terminated or revoked sooner.     Influenza A by PCR NEGATIVE NEGATIVE Final   Influenza B by PCR NEGATIVE NEGATIVE Final    Comment: (NOTE) The Xpert Xpress SARS-CoV-2/FLU/RSV plus assay is intended as an aid in the diagnosis of influenza from Nasopharyngeal swab specimens and should not be used as a sole basis for treatment. Nasal washings and aspirates are unacceptable for Xpert Xpress SARS-CoV-2/FLU/RSV testing.  Fact Sheet for Patients: BloggerCourse.com  Fact Sheet for Healthcare Providers: SeriousBroker.it  This test is not yet approved or cleared by the [place] FDA and has been authorized for detection and/or diagnosis of SARS-CoV-2 by FDA under an Emergency Use Authorization (EUA). This EUA will remain in effect (meaning this test can be used) for the duration of the COVID-19 declaration under Section 564(b)(1) of the Act, 21 U.S.C. section 360bbb-3(b)(1), unless the authorization is terminated or revoked.  Performed at [facility], [address], [place], [place] [address]     Time coordinating discharge: Approximately 40 minutes  Tyrone Nine, MD  [facility] 03/20/2021, 5:22 PM

## 2021-03-20 NOTE — TOC Transition Note (Signed)
Transition of Care [facility]) - CM/SW Discharge Note   Patient Details  Name: Pamela Reed MRN: [id number] Date of Birth: February 06, 1931  [facility] CM/SW Contact:  Chapman Fitch, RN Phone Number: 03/20/2021, 9:26 AM   Clinical Narrative:    Patient to discharge home today Voicemail left for son to let him know that [facility] is able to accept [name] with [facility] Boer notified of discharge  Per Bedside RN son will be picking patient up between 11-12 Md has order rolling walker, however patient has a rollator at home that she prefers to use    Final next level of care: Home w Home Health Services Barriers to Discharge: No Barriers Identified   Patient Goals and CMS Choice        Discharge Placement                       Discharge Plan and Services     Post Acute Care Choice: Home Health                    HH Arranged: PT,OT,Social Work [facility] Agency: [facility] Date [facility] Agency Contacted: 03/20/21   Representative spoke with at [facility] Agency: Maralyn Sago  Social Determinants of Health (SDOH) Interventions     Readmission Risk Interventions No flowsheet data found.

## 2021-03-20 NOTE — Plan of Care (Signed)

## 2021-03-20 NOTE — Care Management Important Message (Signed)
Important Message  Patient Details  Name: Pamela Reed MRN: [id number] Date of Birth: 09-12-1931   Medicare Important Message Given:  Yes - Important Message mailed due to current [facility]     Johnell Comings 03/20/2021, 11:55 AM

## 2021-03-21 DIAGNOSIS — Z28311 Partially vaccinated for covid-19: Secondary | ICD-10-CM | POA: Diagnosis not present

## 2021-03-21 DIAGNOSIS — F32A Depression, unspecified: Secondary | ICD-10-CM | POA: Diagnosis not present

## 2021-03-21 DIAGNOSIS — U071 COVID-19: Secondary | ICD-10-CM | POA: Diagnosis not present

## 2021-03-21 DIAGNOSIS — Z9181 History of falling: Secondary | ICD-10-CM | POA: Diagnosis not present

## 2021-03-21 DIAGNOSIS — J1282 Pneumonia due to coronavirus disease 2019: Secondary | ICD-10-CM | POA: Diagnosis not present

## 2021-03-21 DIAGNOSIS — Z602 Problems related to living alone: Secondary | ICD-10-CM | POA: Diagnosis not present

## 2021-03-21 DIAGNOSIS — J440 Chronic obstructive pulmonary disease with acute lower respiratory infection: Secondary | ICD-10-CM | POA: Diagnosis not present

## 2021-03-21 DIAGNOSIS — G2581 Restless legs syndrome: Secondary | ICD-10-CM | POA: Diagnosis not present

## 2021-03-21 DIAGNOSIS — Z2809 Immunization not carried out because of other contraindication: Secondary | ICD-10-CM | POA: Diagnosis not present

## 2021-03-21 DIAGNOSIS — F419 Anxiety disorder, unspecified: Secondary | ICD-10-CM | POA: Diagnosis not present

## 2021-03-21 DIAGNOSIS — I10 Essential (primary) hypertension: Secondary | ICD-10-CM | POA: Diagnosis not present

## 2021-03-21 LAB — CULTURE, BLOOD (ROUTINE X 2)
Culture: NO GROWTH
Culture: NO GROWTH
Special Requests: ADEQUATE

## 2021-03-24 DIAGNOSIS — Z2809 Immunization not carried out because of other contraindication: Secondary | ICD-10-CM | POA: Diagnosis not present

## 2021-03-24 DIAGNOSIS — F32A Depression, unspecified: Secondary | ICD-10-CM | POA: Diagnosis not present

## 2021-03-24 DIAGNOSIS — Z28311 Partially vaccinated for covid-19: Secondary | ICD-10-CM | POA: Diagnosis not present

## 2021-03-24 DIAGNOSIS — U071 COVID-19: Secondary | ICD-10-CM | POA: Diagnosis not present

## 2021-03-24 DIAGNOSIS — Z602 Problems related to living alone: Secondary | ICD-10-CM | POA: Diagnosis not present

## 2021-03-24 DIAGNOSIS — J440 Chronic obstructive pulmonary disease with acute lower respiratory infection: Secondary | ICD-10-CM | POA: Diagnosis not present

## 2021-03-24 DIAGNOSIS — J1282 Pneumonia due to coronavirus disease 2019: Secondary | ICD-10-CM | POA: Diagnosis not present

## 2021-03-24 DIAGNOSIS — Z9181 History of falling: Secondary | ICD-10-CM | POA: Diagnosis not present

## 2021-03-24 DIAGNOSIS — F419 Anxiety disorder, unspecified: Secondary | ICD-10-CM | POA: Diagnosis not present

## 2021-03-24 DIAGNOSIS — G2581 Restless legs syndrome: Secondary | ICD-10-CM | POA: Diagnosis not present

## 2021-03-24 DIAGNOSIS — I10 Essential (primary) hypertension: Secondary | ICD-10-CM | POA: Diagnosis not present

## 2021-03-28 DIAGNOSIS — U071 COVID-19: Secondary | ICD-10-CM | POA: Diagnosis not present

## 2021-03-28 DIAGNOSIS — I10 Essential (primary) hypertension: Secondary | ICD-10-CM | POA: Diagnosis not present

## 2021-03-28 DIAGNOSIS — F419 Anxiety disorder, unspecified: Secondary | ICD-10-CM | POA: Diagnosis not present

## 2021-03-28 DIAGNOSIS — Z602 Problems related to living alone: Secondary | ICD-10-CM | POA: Diagnosis not present

## 2021-03-28 DIAGNOSIS — F32A Depression, unspecified: Secondary | ICD-10-CM | POA: Diagnosis not present

## 2021-03-28 DIAGNOSIS — Z9181 History of falling: Secondary | ICD-10-CM | POA: Diagnosis not present

## 2021-03-28 DIAGNOSIS — J440 Chronic obstructive pulmonary disease with acute lower respiratory infection: Secondary | ICD-10-CM | POA: Diagnosis not present

## 2021-03-28 DIAGNOSIS — Z2809 Immunization not carried out because of other contraindication: Secondary | ICD-10-CM | POA: Diagnosis not present

## 2021-03-28 DIAGNOSIS — G2581 Restless legs syndrome: Secondary | ICD-10-CM | POA: Diagnosis not present

## 2021-03-28 DIAGNOSIS — J1282 Pneumonia due to coronavirus disease 2019: Secondary | ICD-10-CM | POA: Diagnosis not present

## 2021-03-28 DIAGNOSIS — Z28311 Partially vaccinated for covid-19: Secondary | ICD-10-CM | POA: Diagnosis not present

## 2021-04-08 DIAGNOSIS — J1281 Pneumonia due to SARS-associated coronavirus: Secondary | ICD-10-CM | POA: Diagnosis not present

## 2021-04-08 DIAGNOSIS — I10 Essential (primary) hypertension: Secondary | ICD-10-CM | POA: Diagnosis not present

## 2021-04-08 DIAGNOSIS — E669 Obesity, unspecified: Secondary | ICD-10-CM | POA: Diagnosis not present

## 2021-04-08 DIAGNOSIS — E039 Hypothyroidism, unspecified: Secondary | ICD-10-CM | POA: Diagnosis not present

## 2021-04-08 DIAGNOSIS — M810 Age-related osteoporosis without current pathological fracture: Secondary | ICD-10-CM | POA: Diagnosis not present

## 2021-04-08 DIAGNOSIS — E1165 Type 2 diabetes mellitus with hyperglycemia: Secondary | ICD-10-CM | POA: Diagnosis not present

## 2021-04-08 DIAGNOSIS — Z8616 Personal history of COVID-19: Secondary | ICD-10-CM | POA: Diagnosis not present

## 2021-04-10 DIAGNOSIS — Z28311 Partially vaccinated for covid-19: Secondary | ICD-10-CM | POA: Diagnosis not present

## 2021-04-10 DIAGNOSIS — F419 Anxiety disorder, unspecified: Secondary | ICD-10-CM | POA: Diagnosis not present

## 2021-04-10 DIAGNOSIS — Z9181 History of falling: Secondary | ICD-10-CM | POA: Diagnosis not present

## 2021-04-10 DIAGNOSIS — J1282 Pneumonia due to coronavirus disease 2019: Secondary | ICD-10-CM | POA: Diagnosis not present

## 2021-04-10 DIAGNOSIS — J440 Chronic obstructive pulmonary disease with acute lower respiratory infection: Secondary | ICD-10-CM | POA: Diagnosis not present

## 2021-04-10 DIAGNOSIS — F32A Depression, unspecified: Secondary | ICD-10-CM | POA: Diagnosis not present

## 2021-04-10 DIAGNOSIS — U071 COVID-19: Secondary | ICD-10-CM | POA: Diagnosis not present

## 2021-04-10 DIAGNOSIS — Z2809 Immunization not carried out because of other contraindication: Secondary | ICD-10-CM | POA: Diagnosis not present

## 2021-04-10 DIAGNOSIS — I10 Essential (primary) hypertension: Secondary | ICD-10-CM | POA: Diagnosis not present

## 2021-04-10 DIAGNOSIS — Z602 Problems related to living alone: Secondary | ICD-10-CM | POA: Diagnosis not present

## 2021-04-10 DIAGNOSIS — G2581 Restless legs syndrome: Secondary | ICD-10-CM | POA: Diagnosis not present

## 2021-04-11 DIAGNOSIS — F32A Depression, unspecified: Secondary | ICD-10-CM | POA: Diagnosis not present

## 2021-04-11 DIAGNOSIS — I10 Essential (primary) hypertension: Secondary | ICD-10-CM | POA: Diagnosis not present

## 2021-04-11 DIAGNOSIS — Z602 Problems related to living alone: Secondary | ICD-10-CM | POA: Diagnosis not present

## 2021-04-11 DIAGNOSIS — Z28311 Partially vaccinated for covid-19: Secondary | ICD-10-CM | POA: Diagnosis not present

## 2021-04-11 DIAGNOSIS — G2581 Restless legs syndrome: Secondary | ICD-10-CM | POA: Diagnosis not present

## 2021-04-11 DIAGNOSIS — Z2809 Immunization not carried out because of other contraindication: Secondary | ICD-10-CM | POA: Diagnosis not present

## 2021-04-11 DIAGNOSIS — J1282 Pneumonia due to coronavirus disease 2019: Secondary | ICD-10-CM | POA: Diagnosis not present

## 2021-04-11 DIAGNOSIS — J440 Chronic obstructive pulmonary disease with acute lower respiratory infection: Secondary | ICD-10-CM | POA: Diagnosis not present

## 2021-04-11 DIAGNOSIS — Z9181 History of falling: Secondary | ICD-10-CM | POA: Diagnosis not present

## 2021-04-11 DIAGNOSIS — U071 COVID-19: Secondary | ICD-10-CM | POA: Diagnosis not present

## 2021-04-11 DIAGNOSIS — F419 Anxiety disorder, unspecified: Secondary | ICD-10-CM | POA: Diagnosis not present

## 2021-04-16 DIAGNOSIS — U071 COVID-19: Secondary | ICD-10-CM | POA: Diagnosis not present

## 2021-04-16 DIAGNOSIS — Z602 Problems related to living alone: Secondary | ICD-10-CM | POA: Diagnosis not present

## 2021-04-16 DIAGNOSIS — G2581 Restless legs syndrome: Secondary | ICD-10-CM | POA: Diagnosis not present

## 2021-04-16 DIAGNOSIS — F419 Anxiety disorder, unspecified: Secondary | ICD-10-CM | POA: Diagnosis not present

## 2021-04-16 DIAGNOSIS — Z2809 Immunization not carried out because of other contraindication: Secondary | ICD-10-CM | POA: Diagnosis not present

## 2021-04-16 DIAGNOSIS — Z9181 History of falling: Secondary | ICD-10-CM | POA: Diagnosis not present

## 2021-04-16 DIAGNOSIS — J440 Chronic obstructive pulmonary disease with acute lower respiratory infection: Secondary | ICD-10-CM | POA: Diagnosis not present

## 2021-04-16 DIAGNOSIS — I10 Essential (primary) hypertension: Secondary | ICD-10-CM | POA: Diagnosis not present

## 2021-04-16 DIAGNOSIS — F32A Depression, unspecified: Secondary | ICD-10-CM | POA: Diagnosis not present

## 2021-04-16 DIAGNOSIS — J1282 Pneumonia due to coronavirus disease 2019: Secondary | ICD-10-CM | POA: Diagnosis not present

## 2021-04-16 DIAGNOSIS — Z28311 Partially vaccinated for covid-19: Secondary | ICD-10-CM | POA: Diagnosis not present

## 2021-04-17 DIAGNOSIS — U071 COVID-19: Secondary | ICD-10-CM | POA: Diagnosis not present

## 2021-04-17 DIAGNOSIS — Z9181 History of falling: Secondary | ICD-10-CM | POA: Diagnosis not present

## 2021-04-17 DIAGNOSIS — J440 Chronic obstructive pulmonary disease with acute lower respiratory infection: Secondary | ICD-10-CM | POA: Diagnosis not present

## 2021-04-17 DIAGNOSIS — I10 Essential (primary) hypertension: Secondary | ICD-10-CM | POA: Diagnosis not present

## 2021-04-17 DIAGNOSIS — Z602 Problems related to living alone: Secondary | ICD-10-CM | POA: Diagnosis not present

## 2021-04-17 DIAGNOSIS — J1282 Pneumonia due to coronavirus disease 2019: Secondary | ICD-10-CM | POA: Diagnosis not present

## 2021-04-17 DIAGNOSIS — F419 Anxiety disorder, unspecified: Secondary | ICD-10-CM | POA: Diagnosis not present

## 2021-04-17 DIAGNOSIS — Z28311 Partially vaccinated for covid-19: Secondary | ICD-10-CM | POA: Diagnosis not present

## 2021-04-17 DIAGNOSIS — Z2809 Immunization not carried out because of other contraindication: Secondary | ICD-10-CM | POA: Diagnosis not present

## 2021-04-17 DIAGNOSIS — G2581 Restless legs syndrome: Secondary | ICD-10-CM | POA: Diagnosis not present

## 2021-04-17 DIAGNOSIS — F32A Depression, unspecified: Secondary | ICD-10-CM | POA: Diagnosis not present

## 2021-04-18 DIAGNOSIS — J440 Chronic obstructive pulmonary disease with acute lower respiratory infection: Secondary | ICD-10-CM | POA: Diagnosis not present

## 2021-04-18 DIAGNOSIS — I10 Essential (primary) hypertension: Secondary | ICD-10-CM | POA: Diagnosis not present

## 2021-04-18 DIAGNOSIS — Z28311 Partially vaccinated for covid-19: Secondary | ICD-10-CM | POA: Diagnosis not present

## 2021-04-18 DIAGNOSIS — Z9181 History of falling: Secondary | ICD-10-CM | POA: Diagnosis not present

## 2021-04-18 DIAGNOSIS — F32A Depression, unspecified: Secondary | ICD-10-CM | POA: Diagnosis not present

## 2021-04-18 DIAGNOSIS — J1282 Pneumonia due to coronavirus disease 2019: Secondary | ICD-10-CM | POA: Diagnosis not present

## 2021-04-18 DIAGNOSIS — Z602 Problems related to living alone: Secondary | ICD-10-CM | POA: Diagnosis not present

## 2021-04-18 DIAGNOSIS — G2581 Restless legs syndrome: Secondary | ICD-10-CM | POA: Diagnosis not present

## 2021-04-18 DIAGNOSIS — F419 Anxiety disorder, unspecified: Secondary | ICD-10-CM | POA: Diagnosis not present

## 2021-04-18 DIAGNOSIS — Z2809 Immunization not carried out because of other contraindication: Secondary | ICD-10-CM | POA: Diagnosis not present

## 2021-04-18 DIAGNOSIS — U071 COVID-19: Secondary | ICD-10-CM | POA: Diagnosis not present

## 2021-04-23 DIAGNOSIS — Z602 Problems related to living alone: Secondary | ICD-10-CM | POA: Diagnosis not present

## 2021-04-23 DIAGNOSIS — G2581 Restless legs syndrome: Secondary | ICD-10-CM | POA: Diagnosis not present

## 2021-04-23 DIAGNOSIS — J1282 Pneumonia due to coronavirus disease 2019: Secondary | ICD-10-CM | POA: Diagnosis not present

## 2021-04-23 DIAGNOSIS — Z9181 History of falling: Secondary | ICD-10-CM | POA: Diagnosis not present

## 2021-04-23 DIAGNOSIS — J440 Chronic obstructive pulmonary disease with acute lower respiratory infection: Secondary | ICD-10-CM | POA: Diagnosis not present

## 2021-04-23 DIAGNOSIS — U071 COVID-19: Secondary | ICD-10-CM | POA: Diagnosis not present

## 2021-04-23 DIAGNOSIS — Z2809 Immunization not carried out because of other contraindication: Secondary | ICD-10-CM | POA: Diagnosis not present

## 2021-04-23 DIAGNOSIS — F32A Depression, unspecified: Secondary | ICD-10-CM | POA: Diagnosis not present

## 2021-04-23 DIAGNOSIS — F419 Anxiety disorder, unspecified: Secondary | ICD-10-CM | POA: Diagnosis not present

## 2021-04-23 DIAGNOSIS — Z28311 Partially vaccinated for covid-19: Secondary | ICD-10-CM | POA: Diagnosis not present

## 2021-04-23 DIAGNOSIS — I10 Essential (primary) hypertension: Secondary | ICD-10-CM | POA: Diagnosis not present

## 2021-05-01 DIAGNOSIS — E1165 Type 2 diabetes mellitus with hyperglycemia: Secondary | ICD-10-CM | POA: Diagnosis not present

## 2021-05-01 DIAGNOSIS — E7800 Pure hypercholesterolemia, unspecified: Secondary | ICD-10-CM | POA: Diagnosis not present

## 2021-05-01 DIAGNOSIS — I10 Essential (primary) hypertension: Secondary | ICD-10-CM | POA: Diagnosis not present

## 2021-05-01 DIAGNOSIS — E669 Obesity, unspecified: Secondary | ICD-10-CM | POA: Diagnosis not present

## 2021-05-01 DIAGNOSIS — M810 Age-related osteoporosis without current pathological fracture: Secondary | ICD-10-CM | POA: Diagnosis not present

## 2021-05-01 DIAGNOSIS — R1310 Dysphagia, unspecified: Secondary | ICD-10-CM | POA: Diagnosis not present

## 2021-05-01 DIAGNOSIS — R197 Diarrhea, unspecified: Secondary | ICD-10-CM | POA: Diagnosis not present

## 2021-05-12 ENCOUNTER — Encounter
Admission: RE | Disposition: A | Discharge status: Home / Self Care | Payer: Self-pay | Source: Home / Self Care | Attending: Gastroenterology

## 2021-05-12 ENCOUNTER — Ambulatory Visit: Payer: Medicare HMO | Admitting: Anesthesiology

## 2021-05-12 ENCOUNTER — Encounter: Payer: Self-pay | Admitting: Anesthesiology

## 2021-05-12 ENCOUNTER — Ambulatory Visit
Admission: RE | Admit: 2021-05-12 | Discharge: 2021-05-12 | Disposition: A | Discharge status: Home / Self Care | Payer: Medicare HMO | Attending: Gastroenterology | Admitting: Gastroenterology

## 2021-05-12 DIAGNOSIS — R1310 Dysphagia, unspecified: Secondary | ICD-10-CM | POA: Insufficient documentation

## 2021-05-12 DIAGNOSIS — Z882 Allergy status to sulfonamides status: Secondary | ICD-10-CM | POA: Diagnosis not present

## 2021-05-12 DIAGNOSIS — Z79899 Other long term (current) drug therapy: Secondary | ICD-10-CM | POA: Insufficient documentation

## 2021-05-12 DIAGNOSIS — Z7951 Long term (current) use of inhaled steroids: Secondary | ICD-10-CM | POA: Diagnosis not present

## 2021-05-12 DIAGNOSIS — K219 Gastro-esophageal reflux disease without esophagitis: Secondary | ICD-10-CM | POA: Diagnosis not present

## 2021-05-12 DIAGNOSIS — E739 Lactose intolerance, unspecified: Secondary | ICD-10-CM | POA: Insufficient documentation

## 2021-05-12 DIAGNOSIS — Z8616 Personal history of COVID-19: Secondary | ICD-10-CM | POA: Diagnosis not present

## 2021-05-12 DIAGNOSIS — Z888 Allergy status to other drugs, medicaments and biological substances status: Secondary | ICD-10-CM | POA: Diagnosis not present

## 2021-05-12 DIAGNOSIS — Z91041 Radiographic dye allergy status: Secondary | ICD-10-CM | POA: Insufficient documentation

## 2021-05-12 DIAGNOSIS — E039 Hypothyroidism, unspecified: Secondary | ICD-10-CM | POA: Insufficient documentation

## 2021-05-12 DIAGNOSIS — K222 Esophageal obstruction: Secondary | ICD-10-CM | POA: Insufficient documentation

## 2021-05-12 DIAGNOSIS — Z91013 Allergy to seafood: Secondary | ICD-10-CM | POA: Diagnosis not present

## 2021-05-12 DIAGNOSIS — K449 Diaphragmatic hernia without obstruction or gangrene: Secondary | ICD-10-CM | POA: Insufficient documentation

## 2021-05-12 DIAGNOSIS — Z885 Allergy status to narcotic agent status: Secondary | ICD-10-CM | POA: Diagnosis not present

## 2021-05-12 DIAGNOSIS — Z880 Allergy status to penicillin: Secondary | ICD-10-CM | POA: Diagnosis not present

## 2021-05-12 DIAGNOSIS — Z7982 Long term (current) use of aspirin: Secondary | ICD-10-CM | POA: Insufficient documentation

## 2021-05-12 HISTORY — DX: Chronic obstructive pulmonary disease, unspecified: J44.9

## 2021-05-12 HISTORY — PX: ESOPHAGOGASTRODUODENOSCOPY (EGD) WITH PROPOFOL: SHX5813

## 2021-05-12 SURGERY — ESOPHAGOGASTRODUODENOSCOPY (EGD) WITH PROPOFOL
Anesthesia: General

## 2021-05-12 MED ORDER — PROPOFOL 500 MG/50ML IV EMUL
INTRAVENOUS | Status: DC | PRN
  Administered 2021-05-12: 100 ug/kg/min via INTRAVENOUS

## 2021-05-12 MED ORDER — PROPOFOL 500 MG/50ML IV EMUL
INTRAVENOUS | Status: AC
  Filled 2021-05-12: qty 50

## 2021-05-12 MED ORDER — LIDOCAINE HCL (PF) 2 % IJ SOLN
INTRAMUSCULAR | Status: AC
  Filled 2021-05-12: qty 2

## 2021-05-12 MED ORDER — SODIUM CHLORIDE 0.9 % IV SOLN: INTRAVENOUS | Status: DC

## 2021-05-12 NOTE — Transfer of Care (Signed)
Immediate Anesthesia Transfer of Care Note  Patient: Pamela Reed  Procedure(s) Performed: ESOPHAGOGASTRODUODENOSCOPY (EGD) WITH PROPOFOL  Patient Location: PACU  Anesthesia Type:General  Level of Consciousness: awake, alert  and sedated  Airway & Oxygen Therapy: Patient Spontanous Breathing and Patient connected to nasal cannula oxygen  Post-op Assessment: Report given to RN and Post -op Vital signs reviewed and stable  Post vital signs: Reviewed and stable  Last Vitals:  Vitals Value Taken Time  BP    Temp    Pulse    Resp    SpO2      Last Pain:  Vitals:   05/12/21 0914  TempSrc: Tympanic         Complications: No notable events documented.

## 2021-05-12 NOTE — Interval H&P Note (Signed)
History and Physical Interval Note:  05/12/2021 9:52 AM  Pamela Reed  has presented today for surgery, with the diagnosis of DYSPHAGIA.  The various methods of treatment have been discussed with the patient and family. After consideration of risks, benefits and other options for treatment, the patient has consented to  Procedure(s): ESOPHAGOGASTRODUODENOSCOPY (EGD) WITH PROPOFOL (N/A) as a surgical intervention.  The patient's history has been reviewed, patient examined, no change in status, stable for surgery.  I have reviewed the patient's chart and labs.  Questions were answered to the patient's satisfaction.     Regis Bill  Ok to proceed with EGD

## 2021-05-12 NOTE — Anesthesia Preprocedure Evaluation (Signed)
Anesthesia Evaluation  Patient identified by MRN, date of birth, ID band Patient awake    Reviewed: Allergy & Precautions, H&P , NPO status , Patient's Chart, lab work & pertinent test results  Airway Mallampati: II  TM Distance: >3 FB Neck ROM: full    Dental no notable dental hx.    Pulmonary    Pulmonary exam normal        Cardiovascular hypertension, Pt. on medications Normal cardiovascular exam     Neuro/Psych  Headaches, PSYCHIATRIC DISORDERS Anxiety Depression    GI/Hepatic GERD  Medicated,  Endo/Other  Hypothyroidism   Renal/GU      Musculoskeletal   Abdominal   Peds  Hematology   Anesthesia Other Findings . Allergic rhinitis due to allergen  . Anxiety 03/23/2013  . Glaucoma (increased eye pressure)  . Headache(784.0)  . Hernia  hital hernia  . Hypertension  . Hypothyroidism  . Osteopenia 03/23/2013  . Stress incontinence, female 11/29/2014  . Urinary tract infection 03/24/2013   Reproductive/Obstetrics                             Anesthesia Physical  Anesthesia Plan  ASA: 2  Anesthesia Plan: General   Post-op Pain Management:    Induction: Intravenous  PONV Risk Score and Plan: 2 and Propofol infusion and TIVA  Airway Management Planned: Natural Airway and Nasal Cannula  Additional Equipment:   Intra-op Plan:   Post-operative Plan:   Informed Consent: I have reviewed the patients History and Physical, chart, labs and discussed the procedure including the risks, benefits and alternatives for the proposed anesthesia with the patient or authorized representative who has indicated his/her understanding and acceptance.       Plan Discussed with: CRNA, Anesthesiologist and Surgeon  Anesthesia Plan Comments:        Anesthesia Quick Evaluation

## 2021-05-12 NOTE — Op Note (Signed)
[facility] Gastroenterology Patient Name: Pamela Reed Procedure Date: 05/12/2021 9:44 AM MRN: [id number] Account #: [id number] Date of Birth: [birth year] Admit Type: Outpatient Age: 87 Room: [facility] ROOM 3 Gender: Female Note Status: Finalized Procedure:             Upper GI endoscopy Indications:           Dysphagia Providers:             Andrey Farmer MD, MD Referring MD:          Lenard Simmer, MD (Referring MD) Medicines:             Monitored Anesthesia Care Complications:         No immediate complications. Estimated blood loss:                         Minimal. Procedure:             Pre-Anesthesia Assessment:                        - Prior to the procedure, a History and Physical was                         performed, and patient medications and allergies were                         reviewed. The patient is competent. The risks and                         benefits of the procedure and the sedation options and                         risks were discussed with the patient. All questions                         were answered and informed consent was obtained.                         Patient identification and proposed procedure were                         verified by the physician, the nurse, the anesthetist                         and the technician in the endoscopy suite. Mental                         Status Examination: alert and oriented. Airway                         Examination: normal oropharyngeal airway and neck                         mobility. Respiratory Examination: clear to                         auscultation. CV Examination: normal. Prophylactic                         Antibiotics:  The patient does not require prophylactic                         antibiotics. Prior Anticoagulants: The patient has                         taken no previous anticoagulant or antiplatelet                         agents. ASA Grade Assessment: III - A  patient with                         severe systemic disease. After reviewing the risks and                         benefits, the patient was deemed in satisfactory                         condition to undergo the procedure. The anesthesia                         plan was to use monitored anesthesia care (MAC).                         Immediately prior to administration of medications,                         the patient was re-assessed for adequacy to receive                         sedatives. The heart rate, respiratory rate, oxygen                         saturations, blood pressure, adequacy of pulmonary                         ventilation, and response to care were monitored                         throughout the procedure. The physical status of the                         patient was re-assessed after the procedure.                        After obtaining informed consent, the endoscope was                         passed under direct vision. Throughout the procedure,                         the patient's blood pressure, pulse, and oxygen                         saturations were monitored continuously. The Endoscope                         was introduced through the mouth, and advanced to the  second part of duodenum. The upper GI endoscopy was                         accomplished without difficulty. The patient tolerated                         the procedure well. Findings:      A non-obstructing Schatzki ring was found in the lower third of the       esophagus. A TTS dilator was passed through the scope. Dilation with a       15-16.5-18 mm balloon dilator was performed to 18 mm. The dilation site       was examined and showed mild mucosal disruption. Estimated blood loss       was minimal.      Presbyesophagus      A 6 cm hiatal hernia was present.      The entire examined stomach was normal.      The examined duodenum was normal. Impression:            -  Non-obstructing Schatzki ring. Dilated.                        - 6 cm hiatal hernia.                        - Normal stomach.                        - Normal examined duodenum.                        - No specimens collected. Recommendation:        - Discharge patient to home.                        - Resume previous diet.                        - Use Protonix (pantoprazole) 40 mg PO daily.                        - Return to referring physician as previously                         scheduled. Procedure Code(s):     --- Professional ---                        [phone], Esophagogastroduodenoscopy, flexible,                         transoral; with transendoscopic balloon dilation of                         esophagus (less than 30 mm diameter) Diagnosis Code(s):     --- Professional ---                        K22.2, Esophageal obstruction                        K44.9, Diaphragmatic hernia without obstruction or  gangrene                        R13.10, Dysphagia, unspecified CPT copyright 2019 American [employer] Association. All rights reserved. The codes documented in this report are preliminary and upon coder review may  be revised to meet current compliance requirements. Andrey Farmer MD, MD 05/12/2021 10:10:35 AM Number of Addenda: 0 Note Initiated On: 05/12/2021 9:44 AM Estimated Blood Loss:  Estimated blood loss was minimal.      [facility]

## 2021-05-12 NOTE — Anesthesia Procedure Notes (Signed)
Date/Time: 05/12/2021 10:08 AM Performed by: Tonia Ghent Pre-anesthesia Checklist: Patient identified, Emergency Drugs available, Suction available, Timeout performed and Patient being monitored Patient Re-evaluated:Patient Re-evaluated prior to induction Oxygen Delivery Method: Nasal cannula Preoxygenation: Pre-oxygenation with 100% oxygen Induction Type: IV induction Airway Equipment and Method: Bite block Placement Confirmation: positive ETCO2 and CO2 detector

## 2021-05-12 NOTE — Anesthesia Postprocedure Evaluation (Signed)
Anesthesia Post Note  Patient: MONET NORTH  Procedure(s) Performed: ESOPHAGOGASTRODUODENOSCOPY (EGD) WITH PROPOFOL  Patient location during evaluation: Phase II Anesthesia Type: General Level of consciousness: awake and alert, awake and oriented Pain management: pain level controlled Vital Signs Assessment: post-procedure vital signs reviewed and stable Respiratory status: spontaneous breathing, nonlabored ventilation and respiratory function stable Cardiovascular status: blood pressure returned to baseline and stable Postop Assessment: no apparent nausea or vomiting Anesthetic complications: no   No notable events documented.   Last Vitals:  Vitals:   05/12/21 1020 05/12/21 1030  BP: (!) 198/78 (!) 197/79  Pulse: 68 (!) 58  Resp: (!) 21 18  Temp:    SpO2: 98% 98%    Last Pain:  Vitals:   05/12/21 0914  TempSrc: Tympanic                 Manfred Arch

## 2021-05-12 NOTE — H&P (Signed)
Outpatient short stay form Pre-procedure 05/12/2021 9:50 AM Pamela Lot MD, MPH  Primary Physician: Dr. Patrecia Pace  Reason for visit:  Dysphagia  History of present illness:   87 y/o lady with history of hypothyroidism and recent covid diagnosis here for EGD for dysphagia. Has large hiatal hernia on imaging. Pills and meats give her the most trouble. Sometimes liquids. No blood thinners.    Current Facility-Administered Medications:    0.9 %  sodium chloride infusion, , Intravenous, Continuous, Sharma Lawrance, Rossie Muskrat, MD, Last Rate: 20 mL/hr at 05/12/21 0931, New Bag at 05/12/21 0931  Medications Prior to Admission  Medication Sig Dispense Refill Last Dose   albuterol (VENTOLIN HFA) 108 (90 Base) MCG/ACT inhaler Inhale 2 puffs into the lungs every 6 (six) hours as needed.   05/12/2021   FLUoxetine (PROZAC) 10 MG tablet Take 10 mg by mouth daily.    05/12/2021   levothyroxine (SYNTHROID, LEVOTHROID) 50 MCG tablet Take 50 mcg by mouth daily before breakfast.   05/12/2021   lisinopril (PRINIVIL,ZESTRIL) 20 MG tablet Take 20 mg by mouth 2 (two) times daily.    05/12/2021   metoprolol succinate (TOPROL-XL) 25 MG 24 hr tablet Take 25 mg by mouth 2 (two) times daily.    05/12/2021   omeprazole (PRILOSEC OTC) 20 MG tablet Take 20 mg by mouth daily as needed (heartburn).   05/12/2021   SYMBICORT 80-4.5 MCG/ACT inhaler Inhale into the lungs.   05/12/2021   ALPRAZolam (XANAX) 0.25 MG tablet Take 0.25 mg by mouth 2 (two) times daily as needed.      aspirin 81 MG tablet Take 81 mg by mouth daily.      furosemide (LASIX) 20 MG tablet Take 20 mg by mouth daily.       Magnesium 500 MG TABS Take 500 mg by mouth daily.      Netarsudil Dimesylate (RHOPRESSA) 0.02 % SOLN Place 1 drop into both eyes at bedtime.      ondansetron (ZOFRAN) 4 MG tablet Take 1 tablet (4 mg total) by mouth every 8 (eight) hours as needed for nausea or vomiting. 20 tablet 0    VYZULTA 0.024 % SOLN Place 1 drop into both eyes at bedtime.          Allergies  Allergen Reactions   Contrast Media [Iodinated Diagnostic Agents] Shortness Of Breath   Iodine Shortness Of Breath   Sulfasalazine Itching and Swelling   Dye Fdc Red [Red Dye]    Lactose Intolerance (Gi)     GI upset   Penicillins     Childhood event-unknown reaction   Shellfish Allergy Nausea Only and Swelling    Throat swelling   Sulfa Antibiotics Itching and Swelling   Venlafaxine Swelling    Throat   Codeine Palpitations   Ropinirole Nausea Only     Past Medical History:  Diagnosis Date   Arthritis    spine   Cervicalgia    Chronic cystitis    COPD (chronic obstructive pulmonary disease) (HCC)    Deafness in left ear    s/p skull fracture - age 62   GERD (gastroesophageal reflux disease)    Headache    daily. 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    Physical Exam  Gen: Alert, oriented. Appears stated age.  HEENT: PERRLA. Lungs: No respiratory distress CV: RRR Abd: soft, benign, no masses Ext: No edema    Planned procedures: Proceed with EGD. The patient understands the nature of the planned procedure, indications, risks, alternatives and potential complications including but not limited to bleeding, infection, perforation, damage to internal organs and possible oversedation/side effects from anesthesia. The patient agrees and gives consent to proceed.  Please refer to procedure notes for findings, recommendations and patient disposition/instructions.     Pamela Lot MD, MPH Gastroenterology 05/12/2021  9:50 AM

## 2021-05-13 ENCOUNTER — Encounter: Payer: Self-pay | Admitting: Gastroenterology

## 2021-05-23 DIAGNOSIS — R9439 Abnormal result of other cardiovascular function study: Secondary | ICD-10-CM | POA: Diagnosis not present

## 2021-05-23 DIAGNOSIS — I361 Nonrheumatic tricuspid (valve) insufficiency: Secondary | ICD-10-CM | POA: Diagnosis not present

## 2021-05-23 DIAGNOSIS — I517 Cardiomegaly: Secondary | ICD-10-CM | POA: Diagnosis not present

## 2021-05-23 DIAGNOSIS — R0989 Other specified symptoms and signs involving the circulatory and respiratory systems: Secondary | ICD-10-CM | POA: Diagnosis not present

## 2021-05-23 DIAGNOSIS — I341 Nonrheumatic mitral (valve) prolapse: Secondary | ICD-10-CM | POA: Diagnosis not present

## 2021-06-27 DIAGNOSIS — H43813 Vitreous degeneration, bilateral: Secondary | ICD-10-CM | POA: Diagnosis not present

## 2021-06-27 DIAGNOSIS — H401133 Primary open-angle glaucoma, bilateral, severe stage: Secondary | ICD-10-CM | POA: Diagnosis not present

## 2021-07-30 DIAGNOSIS — E039 Hypothyroidism, unspecified: Secondary | ICD-10-CM | POA: Diagnosis not present

## 2021-07-30 DIAGNOSIS — E538 Deficiency of other specified B group vitamins: Secondary | ICD-10-CM | POA: Diagnosis not present

## 2021-07-30 DIAGNOSIS — I10 Essential (primary) hypertension: Secondary | ICD-10-CM | POA: Diagnosis not present

## 2021-07-30 DIAGNOSIS — E7800 Pure hypercholesterolemia, unspecified: Secondary | ICD-10-CM | POA: Diagnosis not present

## 2021-07-30 DIAGNOSIS — M8008XS Age-related osteoporosis with current pathological fracture, vertebra(e), sequela: Secondary | ICD-10-CM | POA: Diagnosis not present

## 2021-07-30 DIAGNOSIS — E1165 Type 2 diabetes mellitus with hyperglycemia: Secondary | ICD-10-CM | POA: Diagnosis not present

## 2021-09-18 DIAGNOSIS — Z008 Encounter for other general examination: Secondary | ICD-10-CM | POA: Diagnosis not present

## 2021-09-18 DIAGNOSIS — Z809 Family history of malignant neoplasm, unspecified: Secondary | ICD-10-CM | POA: Diagnosis not present

## 2021-09-18 DIAGNOSIS — E039 Hypothyroidism, unspecified: Secondary | ICD-10-CM | POA: Diagnosis not present

## 2021-09-18 DIAGNOSIS — R269 Unspecified abnormalities of gait and mobility: Secondary | ICD-10-CM | POA: Diagnosis not present

## 2021-09-18 DIAGNOSIS — Z7982 Long term (current) use of aspirin: Secondary | ICD-10-CM | POA: Diagnosis not present

## 2021-09-18 DIAGNOSIS — N3946 Mixed incontinence: Secondary | ICD-10-CM | POA: Diagnosis not present

## 2021-09-18 DIAGNOSIS — I10 Essential (primary) hypertension: Secondary | ICD-10-CM | POA: Diagnosis not present

## 2021-09-18 DIAGNOSIS — Z823 Family history of stroke: Secondary | ICD-10-CM | POA: Diagnosis not present

## 2021-09-18 DIAGNOSIS — H409 Unspecified glaucoma: Secondary | ICD-10-CM | POA: Diagnosis not present

## 2021-09-18 DIAGNOSIS — F325 Major depressive disorder, single episode, in full remission: Secondary | ICD-10-CM | POA: Diagnosis not present

## 2021-09-18 DIAGNOSIS — Z7722 Contact with and (suspected) exposure to environmental tobacco smoke (acute) (chronic): Secondary | ICD-10-CM | POA: Diagnosis not present

## 2021-09-18 DIAGNOSIS — F411 Generalized anxiety disorder: Secondary | ICD-10-CM | POA: Diagnosis not present

## 2021-09-18 DIAGNOSIS — J449 Chronic obstructive pulmonary disease, unspecified: Secondary | ICD-10-CM | POA: Diagnosis not present

## 2021-10-20 ENCOUNTER — Emergency Department
Admission: EM | Admit: 2021-10-20 | Discharge: 2021-10-20 | Disposition: A | Discharge status: Home / Self Care | Payer: Medicare HMO | Attending: Emergency Medicine | Admitting: Emergency Medicine

## 2021-10-20 ENCOUNTER — Other Ambulatory Visit: Payer: Self-pay

## 2021-10-20 ENCOUNTER — Emergency Department: Payer: Medicare HMO

## 2021-10-20 DIAGNOSIS — Z8616 Personal history of COVID-19: Secondary | ICD-10-CM | POA: Diagnosis not present

## 2021-10-20 DIAGNOSIS — I10 Essential (primary) hypertension: Secondary | ICD-10-CM | POA: Insufficient documentation

## 2021-10-20 DIAGNOSIS — N3000 Acute cystitis without hematuria: Secondary | ICD-10-CM | POA: Insufficient documentation

## 2021-10-20 DIAGNOSIS — Z7982 Long term (current) use of aspirin: Secondary | ICD-10-CM | POA: Insufficient documentation

## 2021-10-20 DIAGNOSIS — Z7951 Long term (current) use of inhaled steroids: Secondary | ICD-10-CM | POA: Insufficient documentation

## 2021-10-20 DIAGNOSIS — E039 Hypothyroidism, unspecified: Secondary | ICD-10-CM | POA: Diagnosis not present

## 2021-10-20 DIAGNOSIS — R42 Dizziness and giddiness: Secondary | ICD-10-CM | POA: Diagnosis not present

## 2021-10-20 DIAGNOSIS — R791 Abnormal coagulation profile: Secondary | ICD-10-CM | POA: Insufficient documentation

## 2021-10-20 DIAGNOSIS — Z79899 Other long term (current) drug therapy: Secondary | ICD-10-CM | POA: Diagnosis not present

## 2021-10-20 DIAGNOSIS — R519 Headache, unspecified: Secondary | ICD-10-CM | POA: Insufficient documentation

## 2021-10-20 DIAGNOSIS — J449 Chronic obstructive pulmonary disease, unspecified: Secondary | ICD-10-CM | POA: Diagnosis not present

## 2021-10-20 DIAGNOSIS — R300 Dysuria: Secondary | ICD-10-CM | POA: Diagnosis not present

## 2021-10-20 LAB — DIFFERENTIAL
Abs Immature Granulocytes: 0.02 10*3/uL (ref 0.00–0.07)
Basophils Absolute: 0.1 10*3/uL (ref 0.0–0.1)
Basophils Relative: 1 %
Eosinophils Absolute: 0.2 10*3/uL (ref 0.0–0.5)
Eosinophils Relative: 2 %
Immature Granulocytes: 0 %
Lymphocytes Relative: 27 %
Lymphs Abs: 2.7 10*3/uL (ref 0.7–4.0)
Monocytes Absolute: 0.6 10*3/uL (ref 0.1–1.0)
Monocytes Relative: 7 %
Neutro Abs: 6.2 10*3/uL (ref 1.7–7.7)
Neutrophils Relative %: 63 %

## 2021-10-20 LAB — URINALYSIS, ROUTINE W REFLEX MICROSCOPIC
Bilirubin Urine: NEGATIVE
Glucose, UA: 100 mg/dL — AB
Nitrite: POSITIVE — AB
Protein, ur: 100 mg/dL — AB
Specific Gravity, Urine: 1.01 (ref 1.005–1.030)
WBC, UA: >50 WBC/hpf — ABNORMAL HIGH (ref 0–5)
pH: 6 (ref 5.0–8.0)

## 2021-10-20 LAB — CBC
HCT: 36.3 % (ref 36.0–46.0)
Hemoglobin: 11.8 g/dL — ABNORMAL LOW (ref 12.0–15.0)
MCH: 29.1 pg (ref 26.0–34.0)
MCHC: 32.5 g/dL (ref 30.0–36.0)
MCV: 89.4 fL (ref 80.0–100.0)
Platelets: 313 10*3/uL (ref 150–400)
RBC: 4.06 MIL/uL (ref 3.87–5.11)
RDW: 15.6 % — ABNORMAL HIGH (ref 11.5–15.5)
WBC: 9.9 10*3/uL (ref 4.0–10.5)
nRBC: 0 % (ref 0.0–0.2)

## 2021-10-20 LAB — COMPREHENSIVE METABOLIC PANEL
ALT: 16 U/L (ref 0–44)
AST: 21 U/L (ref 15–41)
Albumin: 4 g/dL (ref 3.5–5.0)
Alkaline Phosphatase: 82 U/L (ref 38–126)
Anion gap: 6 (ref 5–15)
BUN: 8 mg/dL (ref 8–23)
CO2: 25 mmol/L (ref 22–32)
Calcium: 9.3 mg/dL (ref 8.9–10.3)
Chloride: 102 mmol/L (ref 98–111)
Creatinine, Ser: 0.96 mg/dL (ref 0.44–1.00)
GFR, Estimated: 57 mL/min — ABNORMAL LOW (ref >60)
Glucose, Bld: 97 mg/dL (ref 70–99)
Potassium: 3.6 mmol/L (ref 3.5–5.1)
Sodium: 133 mmol/L — ABNORMAL LOW (ref 135–145)
Total Bilirubin: 1.2 mg/dL (ref 0.3–1.2)
Total Protein: 6.8 g/dL (ref 6.5–8.1)

## 2021-10-20 LAB — APTT: aPTT: 28 seconds (ref 24–36)

## 2021-10-20 LAB — PROTIME-INR
INR: 1 (ref 0.8–1.2)
Prothrombin Time: 13.1 seconds (ref 11.4–15.2)

## 2021-10-20 MED ORDER — SODIUM CHLORIDE 0.9% FLUSH
3.0000 mL | Freq: Once | INTRAVENOUS | Status: DC
Start: 2021-10-20 — End: 2021-10-20

## 2021-10-20 MED ORDER — CEPHALEXIN 500 MG PO CAPS: 500.0000 mg | ORAL_CAPSULE | Freq: Three times a day (TID) | ORAL | 0 refills | Status: AC

## 2021-10-20 NOTE — ED Provider Notes (Signed)
[facility] Emergency Department Provider Note ____________________________________________   Event Date/Time   First MD Initiated Contact with Patient 10/20/21 1641     (approximate)  I have reviewed the triage vital signs and the nursing notes.  HISTORY  Chief Complaint Hypertension and Headache   HPI Pamela Reed is a 87 y.o. femalewho presents to the ED for evaluation of hypertension.   Chart review indicates hx HTN on lisinopril.   Patient presents to the ED for the ration of 3-4 days of hypertension.  She reports that she has had a headache for years that she attributes to her glaucoma.  She reports compliance with her lisinopril, despite this she has been checking her blood pressure and often having systolic readings [id number].  Denies any chest pain, shortness of breath, syncope or sudden worsening of her chronic headaches.  Further reports "a few days" of dysuria without fever or flank pain.  Denies diarrhea, vaginal discharge or bleeding, emesis.  Her PCP is out of town for the next couple days and she called the nurse line and referred to the ED for evaluation.  Past Medical History:  Diagnosis Date   Arthritis    spine   Cervicalgia    Chronic cystitis    COPD (chronic obstructive pulmonary disease) (HCC)    Deafness in left ear    s/p skull fracture - age 51   GERD (gastroesophageal reflux disease)    Headache    daily. 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   Hypertension    Hypothyroidism    Mixed incontinence urge and stress    Vertigo    Wears hearing aid    right ear    Patient Active Problem List   Diagnosis Date Noted   COVID-19 virus infection 03/17/2021   Hyponatremia 03/17/2021   Hypokalemia 03/17/2021   Dysphagia 03/17/2021   HTN (hypertension) 03/17/2021   Hypothyroidism 03/17/2021   Anxiety 03/17/2021   Depression 03/17/2021   Glaucoma 03/17/2021    Past Surgical  History:  Procedure Laterality Date   ABDOMINAL HYSTERECTOMY     CATARACT EXTRACTION W/ INTRAOCULAR LENS IMPLANT     CHOLECYSTECTOMY     COLONOSCOPY     ESOPHAGOGASTRODUODENOSCOPY (EGD) WITH PROPOFOL N/A 05/12/2021   Procedure: ESOPHAGOGASTRODUODENOSCOPY (EGD) WITH PROPOFOL;  Surgeon: Lesly Rubenstein, MD;  Location: [facility] ENDOSCOPY;  Service: Endoscopy;  Laterality: N/A;   EYE SURGERY Bilateral    eye shunts   FOOT SURGERY     GLAUCOMA SURGERY     PHOTOCOAGULATION WITH LASER Left 09/07/2016   Procedure: PHOTOCOAGULATION WITH LASER;  Surgeon: Ronnell Freshwater, MD;  Location: [place];  Service: Ophthalmology;  Laterality: Left;  LEFT   TONSILLECTOMY      Prior to Admission medications   Medication Sig Start Date End Date Taking? Authorizing Provider  cephALEXin (KEFLEX) 500 MG capsule Take 1 capsule (500 mg total) by mouth 3 (three) times daily for 5 days. 10/20/21 10/25/21 Yes Vladimir Crofts, MD  albuterol (VENTOLIN HFA) 108 (90 Base) MCG/ACT inhaler Inhale 2 puffs into the lungs every 6 (six) hours as needed. 05/17/20   [provider]  ALPRAZolam Duanne Moron) 0.25 MG tablet Take 0.25 mg by mouth 2 (two) times daily as needed. 03/07/21   [provider]  aspirin 81 MG tablet Take 81 mg by mouth daily.    [provider]  FLUoxetine (PROZAC) 10 MG  tablet Take 10 mg by mouth daily.     [provider]  furosemide (LASIX) 20 MG tablet Take 20 mg by mouth daily.     [provider]  levothyroxine (SYNTHROID, LEVOTHROID) 50 MCG tablet Take 50 mcg by mouth daily before breakfast.    [provider]  lisinopril (PRINIVIL,ZESTRIL) 20 MG tablet Take 20 mg by mouth 2 (two) times daily.     [provider]  Magnesium 500 MG TABS Take 500 mg by mouth daily.    [provider]  metoprolol succinate (TOPROL-XL) 25 MG 24 hr tablet Take 25 mg by mouth 2 (two) times daily.     [provider]  Netarsudil  Dimesylate (RHOPRESSA) 0.02 % SOLN Place 1 drop into both eyes at bedtime.    [provider]  omeprazole (PRILOSEC OTC) 20 MG tablet Take 20 mg by mouth daily as needed (heartburn).    [provider]  ondansetron (ZOFRAN) 4 MG tablet Take 1 tablet (4 mg total) by mouth every 8 (eight) hours as needed for nausea or vomiting. 03/20/21   Tyrone Nine, MD  SYMBICORT 80-4.5 MCG/ACT inhaler Inhale into the lungs. 02/24/21   [provider]  VYZULTA 0.024 % SOLN Place 1 drop into both eyes at bedtime.     [provider]    Allergies Contrast media [iodinated diagnostic agents], Iodine, Sulfasalazine, Dye fdc red [red dye], Lactose intolerance (gi), Penicillins, Shellfish allergy, Sulfa antibiotics, Venlafaxine, Codeine, and Ropinirole  No family history on file.  Social History Social History   Tobacco Use   Smoking status: Never   Smokeless tobacco: Never  Vaping Use   Vaping Use: Never used  Substance Use Topics   Alcohol use: No   Drug use: No    Review of Systems  Constitutional: No fever/chills Eyes: No visual changes. ENT: No sore throat. Cardiovascular: Denies chest pain. Respiratory: Denies shortness of breath. Gastrointestinal: No abdominal pain.  No nausea, no vomiting.  No diarrhea.  No constipation. Genitourinary: Negative for dysuria. Musculoskeletal: Negative for back pain. Skin: Negative for rash. Neurological: Negative for focal weakness or numbness. Positive for chronic headache ____________________________________________   PHYSICAL EXAM:  VITAL SIGNS: Vitals:   10/20/21 1515  BP: (!) 193/90  Pulse: 66  Resp: 18  Temp: 98 F (36.7 C)  SpO2: 96%     Constitutional: Alert and oriented. Well appearing and in no acute distress. Eyes: Conjunctivae are normal. PERRL. EOMI. Head: Atraumatic. Nose: No congestion/rhinnorhea. Mouth/Throat: Mucous membranes are moist.  Oropharynx non-erythematous. Neck: No stridor. No  cervical spine tenderness to palpation. Cardiovascular: Normal rate, regular rhythm. Grossly normal heart sounds.  Good peripheral circulation. Respiratory: Normal respiratory effort.  No retractions. Lungs CTAB. Gastrointestinal: Soft , nondistended, nontender to palpation. No CVA tenderness. Musculoskeletal: No lower extremity tenderness nor edema.  No joint effusions. No signs of acute trauma. Cranial nerves II through XII intact 5/5 strength and sensation in all 4 extremities Neurologic:  Normal speech and language. No gross focal neurologic deficits are appreciated. No gait instability noted. Skin:  Skin is warm, dry and intact. No rash noted. Psychiatric: Mood and affect are normal. Speech and behavior are normal.  ____________________________________________   LABS (all labs ordered are listed, but only abnormal results are displayed)  Labs Reviewed  CBC - Abnormal; Notable for the following components:      Result Value   Hemoglobin 11.8 (*)    RDW 15.6 (*)    All other components within  normal limits  COMPREHENSIVE METABOLIC PANEL - Abnormal; Notable for the following components:   Sodium 133 (*)    GFR, Estimated 57 (*)    All other components within normal limits  URINALYSIS, ROUTINE W REFLEX MICROSCOPIC - Abnormal; Notable for the following components:   Color, Urine AMBER (*)    APPearance CLEAR (*)    Glucose, UA 100 (*)    Hgb urine dipstick TRACE (*)    Ketones, ur TRACE (*)    Protein, ur 100 (*)    Nitrite POSITIVE (*)    Leukocytes,Ua LARGE (*)    WBC, UA >50 (*)    Bacteria, UA FEW (*)    All other components within normal limits  PROTIME-INR  APTT  DIFFERENTIAL  CBG MONITORING, ED  I-STAT CREATININE, ED   ____________________________________________  12 Lead EKG  Sinus rhythm, rate of 65 bpm.  Normal axis and intervals.  No STEMI. ____________________________________________  RADIOLOGY  ED MD interpretation: CT head reviewed by me without  evidence of acute intracranial pathology  Official radiology report(s): CT HEAD WO CONTRAST  Result Date: 10/20/2021 CLINICAL DATA:  Hypertension.  Dizziness, nonspecific.  Headache. EXAM: CT HEAD WITHOUT CONTRAST TECHNIQUE: Contiguous axial images were obtained from the base of the skull through the vertex without intravenous contrast. COMPARISON:  08/22/2012 FINDINGS: Brain: Mild age related volume loss. Mild chronic small-vessel ischemic change of the hemispheric white matter. No sign of acute infarction, mass lesion, hemorrhage, hydrocephalus or extra-axial collection. Vascular: There is atherosclerotic calcification of the major vessels at the base of the brain. Skull: Negative Sinuses/Orbits: Clear sinuses.  Silastic implant in the right orbit. Other: None IMPRESSION: No acute CT finding. Generalized atrophy. Mild chronic small-vessel change of the white matter. Electronically Signed   By: Nelson Chimes M.D.   On: 10/20/2021 15:48    ____________________________________________   PROCEDURES and INTERVENTIONS  Procedure(s) performed (including Critical Care):  Procedures  Medications  sodium chloride flush (NS) 0.9 % injection 3 mL (has no administration in time range)    ____________________________________________   MDM / ED COURSE   87 year old female presents to the ED with poorly controlled primary hypertension and acute cystitis amenable to outpatient management.  No evidence of sepsis, instability or endorgan damage from her hypertension.  No evidence of SAH.  Urine is infectious, particularly with her acute symptoms of dysuria, we will send for a culture and start her on Keflex for this.  Due to her hypertension, we discussed increasing her lisinopril to 40 mg daily and following up with her PCP.  Return precautions for the ED discussed.     ____________________________________________   FINAL CLINICAL IMPRESSION(S) / ED DIAGNOSES  Final diagnoses:  Acute cystitis  without hematuria  Primary hypertension     ED Discharge Orders          Ordered    cephALEXin (KEFLEX) 500 MG capsule  3 times daily        10/20/21 1729             Pamela Reed   Note:  This document was prepared using Dragon voice recognition software and may include unintentional dictation errors.    Vladimir Crofts, MD 10/20/21 [phone]

## 2021-10-20 NOTE — ED Triage Notes (Signed)
Pt here with hypertension, was 200/110 at home. Pt states that she has had a HA for 3 days with a small pain on her right flank side. Pt took Azo at home. Pt in NAD in triage.

## 2021-10-20 NOTE — Discharge Instructions (Signed)
Please double your dose of lisinopril at home.  Take 40 mg per dose, 2 tablets at a time.  Continue all of your other medications.  You are being discharged with a 5-day prescription of Keflex antibiotic to take 3 times daily to treat a bladder infection/UTI.  Follow-up with your PCP.

## 2021-10-22 LAB — URINE CULTURE: Culture: >=100000 — AB

## 2021-10-27 DIAGNOSIS — E538 Deficiency of other specified B group vitamins: Secondary | ICD-10-CM | POA: Diagnosis not present

## 2021-10-27 DIAGNOSIS — E7800 Pure hypercholesterolemia, unspecified: Secondary | ICD-10-CM | POA: Diagnosis not present

## 2021-10-27 DIAGNOSIS — E039 Hypothyroidism, unspecified: Secondary | ICD-10-CM | POA: Diagnosis not present

## 2021-10-27 DIAGNOSIS — R300 Dysuria: Secondary | ICD-10-CM | POA: Diagnosis not present

## 2021-10-27 DIAGNOSIS — R519 Headache, unspecified: Secondary | ICD-10-CM | POA: Diagnosis not present

## 2021-10-27 DIAGNOSIS — I10 Essential (primary) hypertension: Secondary | ICD-10-CM | POA: Diagnosis not present

## 2021-10-27 DIAGNOSIS — N3000 Acute cystitis without hematuria: Secondary | ICD-10-CM | POA: Diagnosis not present

## 2021-10-27 DIAGNOSIS — H902 Conductive hearing loss, unspecified: Secondary | ICD-10-CM | POA: Diagnosis not present

## 2021-10-27 DIAGNOSIS — N8110 Cystocele, unspecified: Secondary | ICD-10-CM | POA: Diagnosis not present

## 2021-10-27 DIAGNOSIS — F411 Generalized anxiety disorder: Secondary | ICD-10-CM | POA: Diagnosis not present

## 2021-10-27 DIAGNOSIS — E1165 Type 2 diabetes mellitus with hyperglycemia: Secondary | ICD-10-CM | POA: Diagnosis not present

## 2021-10-27 DIAGNOSIS — N393 Stress incontinence (female) (male): Secondary | ICD-10-CM | POA: Diagnosis not present

## 2021-11-04 DIAGNOSIS — E7800 Pure hypercholesterolemia, unspecified: Secondary | ICD-10-CM | POA: Diagnosis not present

## 2021-11-04 DIAGNOSIS — E538 Deficiency of other specified B group vitamins: Secondary | ICD-10-CM | POA: Diagnosis not present

## 2021-11-04 DIAGNOSIS — I10 Essential (primary) hypertension: Secondary | ICD-10-CM | POA: Diagnosis not present

## 2021-11-04 DIAGNOSIS — M8008XS Age-related osteoporosis with current pathological fracture, vertebra(e), sequela: Secondary | ICD-10-CM | POA: Diagnosis not present

## 2021-11-04 DIAGNOSIS — J449 Chronic obstructive pulmonary disease, unspecified: Secondary | ICD-10-CM | POA: Diagnosis not present

## 2021-11-04 DIAGNOSIS — E039 Hypothyroidism, unspecified: Secondary | ICD-10-CM | POA: Diagnosis not present

## 2021-11-04 DIAGNOSIS — E1165 Type 2 diabetes mellitus with hyperglycemia: Secondary | ICD-10-CM | POA: Diagnosis not present

## 2021-11-04 DIAGNOSIS — R600 Localized edema: Secondary | ICD-10-CM | POA: Diagnosis not present

## 2021-11-07 DIAGNOSIS — H524 Presbyopia: Secondary | ICD-10-CM | POA: Diagnosis not present

## 2021-11-07 DIAGNOSIS — H43813 Vitreous degeneration, bilateral: Secondary | ICD-10-CM | POA: Diagnosis not present

## 2021-11-07 DIAGNOSIS — H401133 Primary open-angle glaucoma, bilateral, severe stage: Secondary | ICD-10-CM | POA: Diagnosis not present

## 2021-11-21 DIAGNOSIS — M1712 Unilateral primary osteoarthritis, left knee: Secondary | ICD-10-CM | POA: Diagnosis not present

## 2021-11-21 DIAGNOSIS — M79672 Pain in left foot: Secondary | ICD-10-CM | POA: Diagnosis not present

## 2021-12-05 DIAGNOSIS — M79675 Pain in left toe(s): Secondary | ICD-10-CM | POA: Diagnosis not present

## 2021-12-18 DIAGNOSIS — L814 Other melanin hyperpigmentation: Secondary | ICD-10-CM | POA: Diagnosis not present

## 2021-12-18 DIAGNOSIS — D2261 Melanocytic nevi of right upper limb, including shoulder: Secondary | ICD-10-CM | POA: Diagnosis not present

## 2021-12-18 DIAGNOSIS — D2272 Melanocytic nevi of left lower limb, including hip: Secondary | ICD-10-CM | POA: Diagnosis not present

## 2021-12-18 DIAGNOSIS — X32XXXA Exposure to sunlight, initial encounter: Secondary | ICD-10-CM | POA: Diagnosis not present

## 2021-12-18 DIAGNOSIS — L821 Other seborrheic keratosis: Secondary | ICD-10-CM | POA: Diagnosis not present

## 2021-12-18 DIAGNOSIS — D225 Melanocytic nevi of trunk: Secondary | ICD-10-CM | POA: Diagnosis not present

## 2021-12-18 DIAGNOSIS — D2271 Melanocytic nevi of right lower limb, including hip: Secondary | ICD-10-CM | POA: Diagnosis not present

## 2021-12-18 DIAGNOSIS — D2262 Melanocytic nevi of left upper limb, including shoulder: Secondary | ICD-10-CM | POA: Diagnosis not present

## 2021-12-24 DIAGNOSIS — R0602 Shortness of breath: Secondary | ICD-10-CM | POA: Diagnosis not present

## 2021-12-24 IMAGING — DX DG CHEST 1V PORT
1 series · 1 of 1 positions shown · non-contrast
Comparison: 08/03/2016

CLINICAL DATA: Sepsis. History of hypertension and gastroesophageal
reflux disease.

EXAM:
PORTABLE CHEST 1 VIEW

[chest ap]
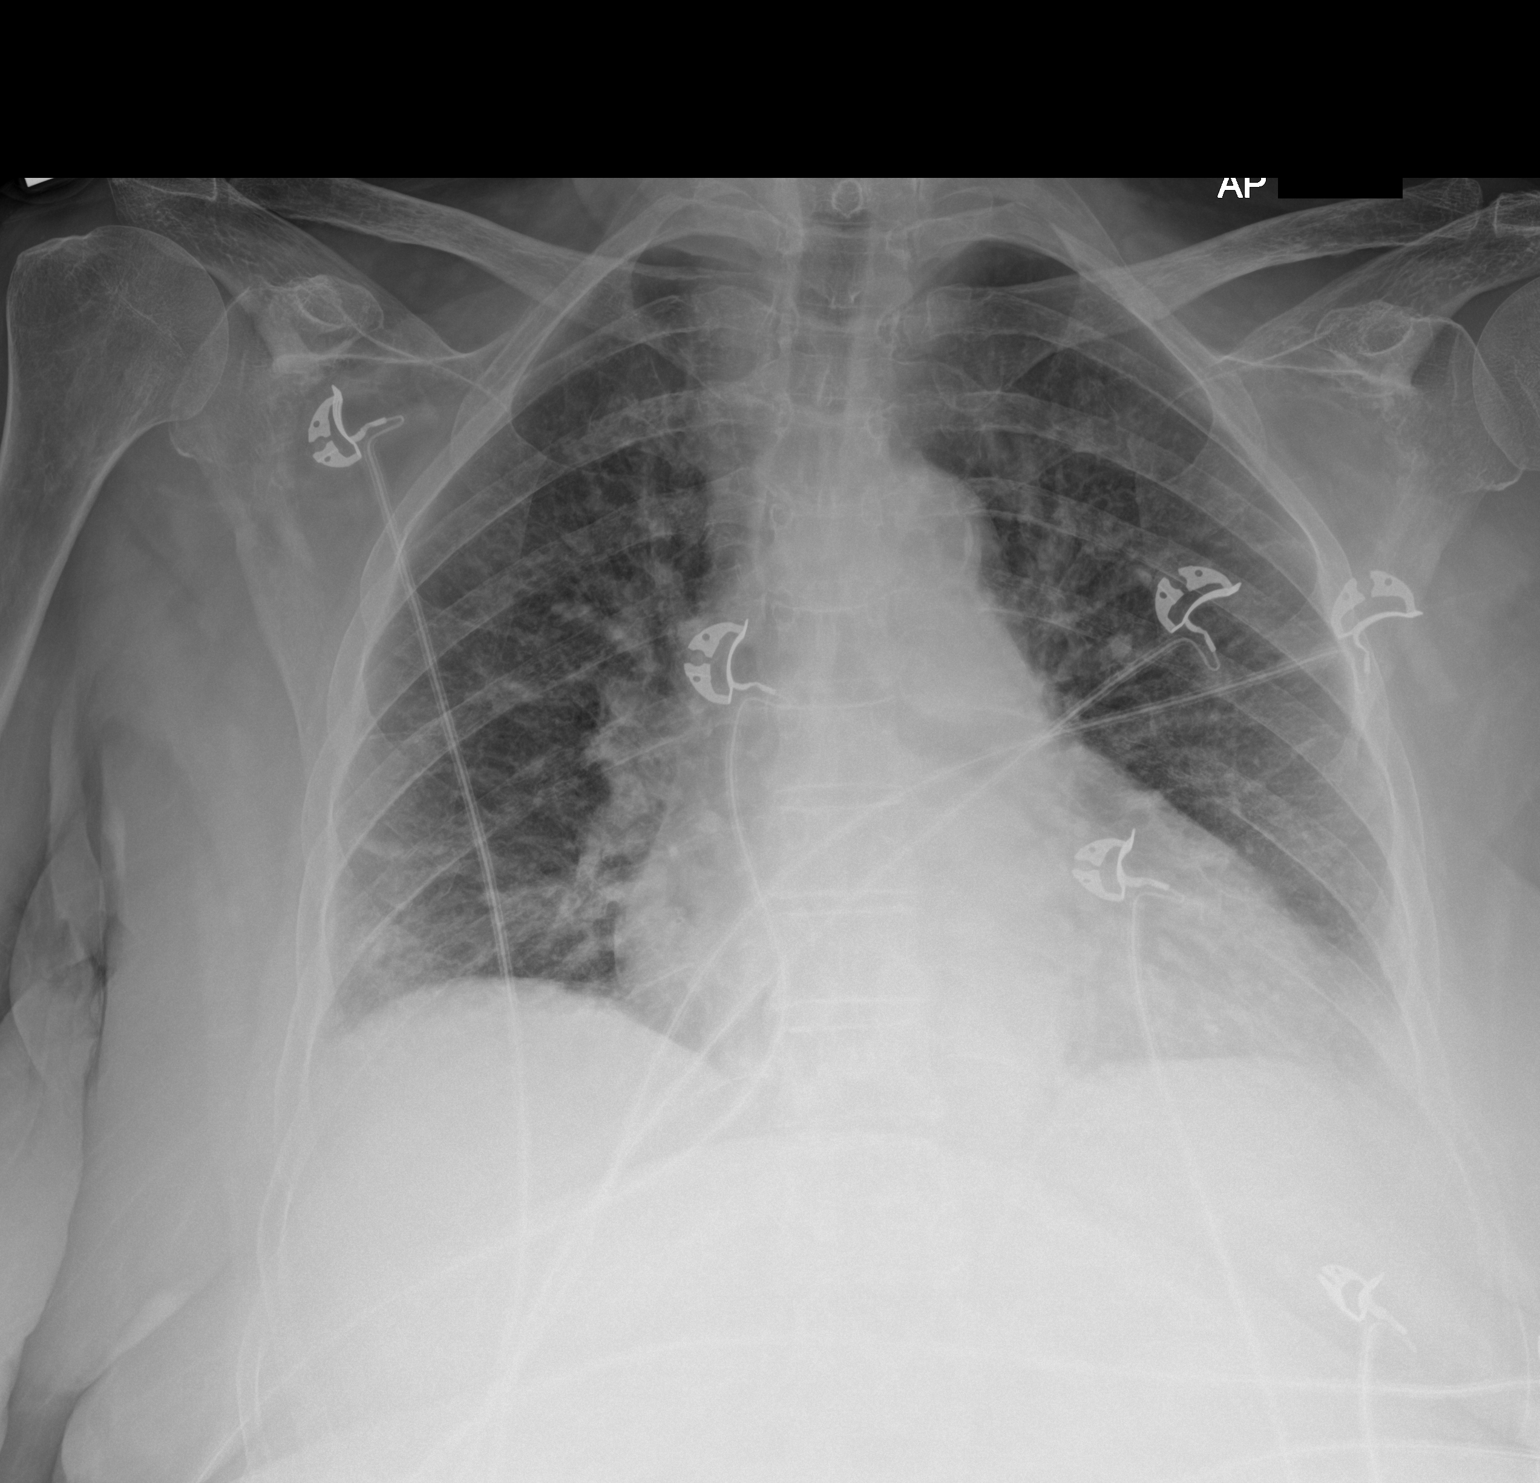

[1 of 1 positions shown; findings below may reference images not displayed]

FINDINGS: Cardiac enlargement with interstitial changes in the lungs likely
due to edema. No definite pleural effusions or pneumothorax.
Mediastinal contours appear intact. Calcified and tortuous aorta.
IMPRESSION: Cardiac enlargement with interstitial edema.

## 2021-12-26 IMAGING — CT CT ABD-PELV W/O CM
2 of 4 series · 16 of 46 positions shown, 18 images · non-contrast
Comparison: 02/06/2005

CLINICAL DATA: Diarrhea. Admitted overnight with E1T80-2C
infection. Progressive generalized weakness. Left lower quadrant
pain

EXAM:
CT ABDOMEN AND PELVIS WITHOUT CONTRAST
TECHNIQUE: Multidetector CT imaging of the abdomen and pelvis was performed
following the standard protocol without IV contrast.

[Series 2: routine abd/pel wo · axial · 0.89mm/px · z∈[-977,-572]mm · 13 of 89 slices shown, 15 images]
[im 4/89  soft-tissue]
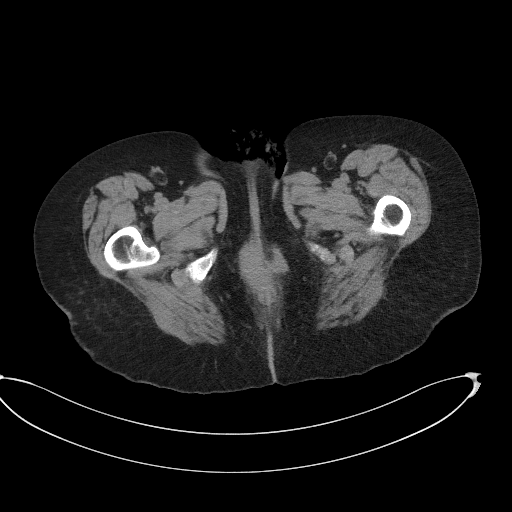
[im 4/89  bone]
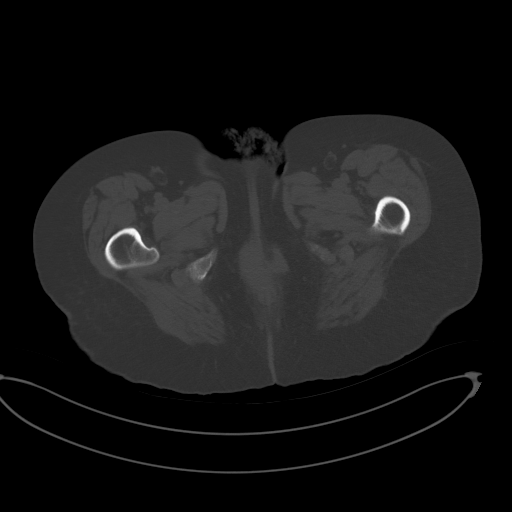
[im 12/89  soft-tissue]
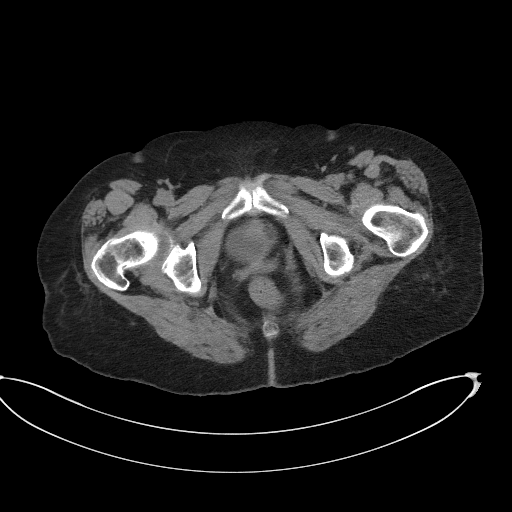
[im 19/89  soft-tissue]
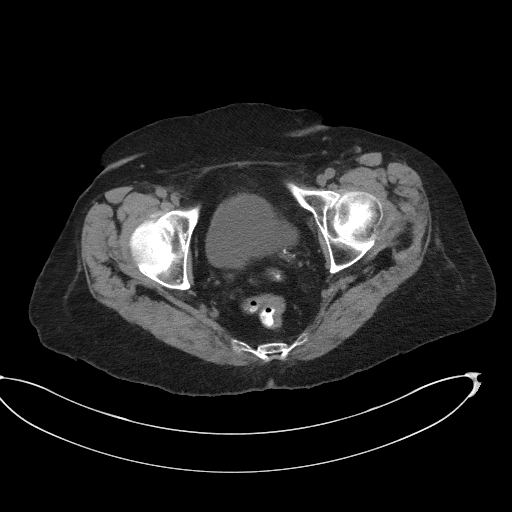
[im 26/89  soft-tissue]
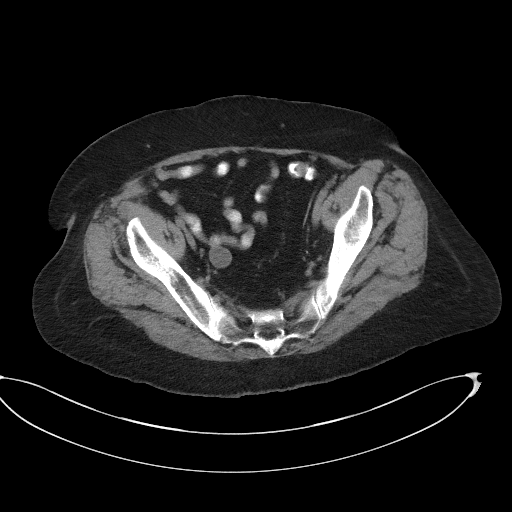
[im 30/89  soft-tissue]
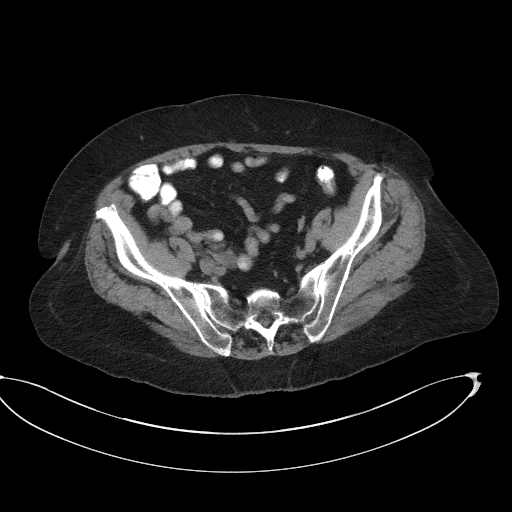
[im 37/89  soft-tissue]
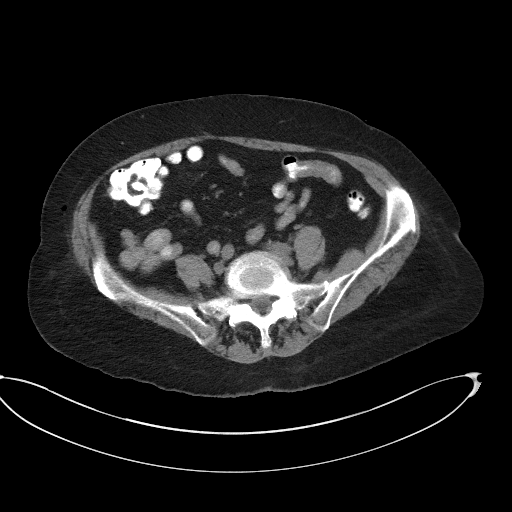
[im 45/89  soft-tissue]
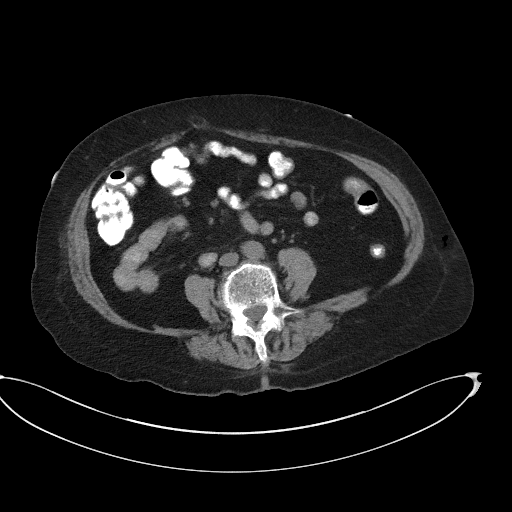
[im 52/89  soft-tissue]
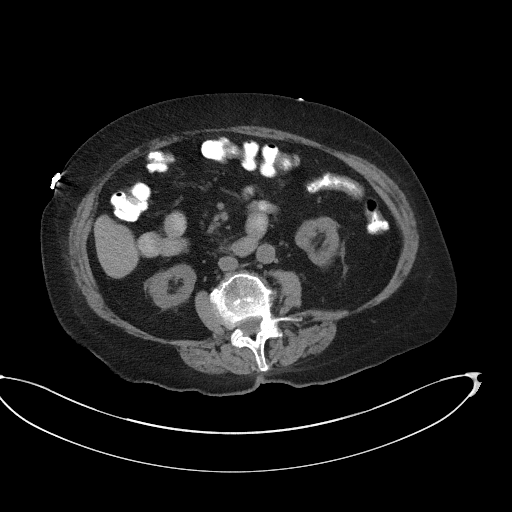
[im 59/89  soft-tissue]
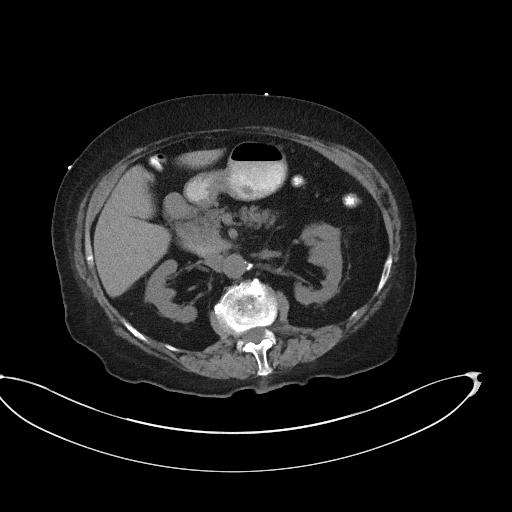
[im 59/89  bone]
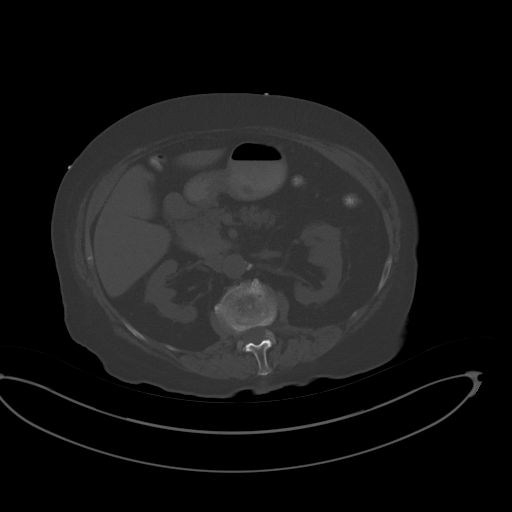
[im 63/89  soft-tissue]
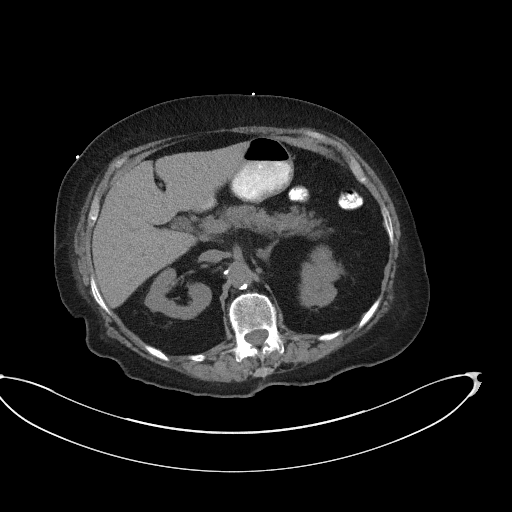
[im 70/89  soft-tissue]
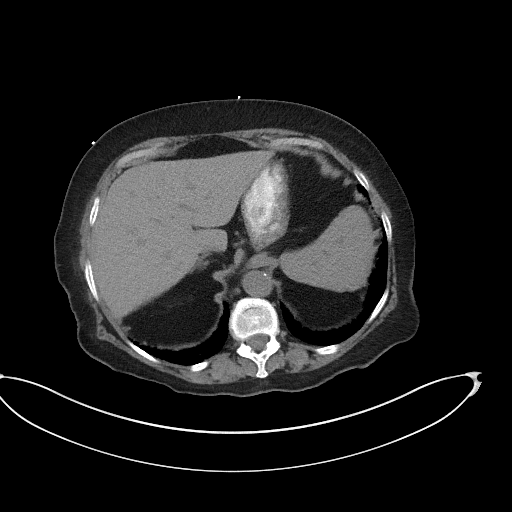
[im 78/89  soft-tissue]
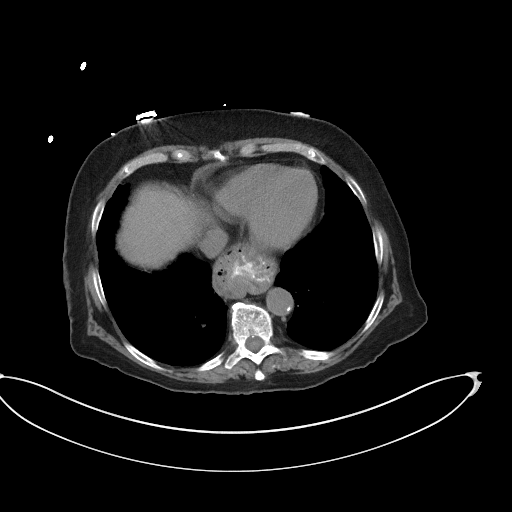
[im 85/89  soft-tissue]
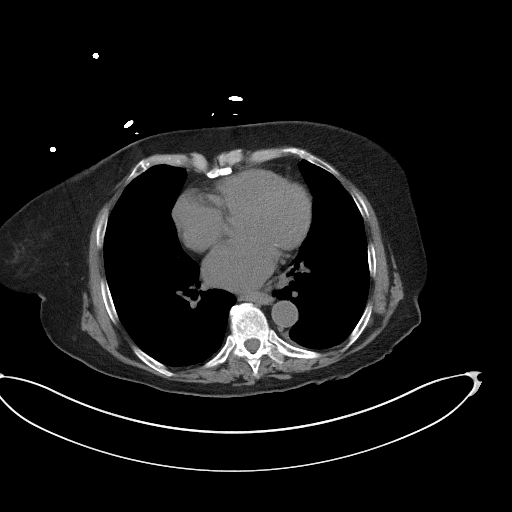

[Series 5: coronal st · coronal · 0.82mm/px · 3 of 95 slices shown]
[im 32/95  soft-tissue]
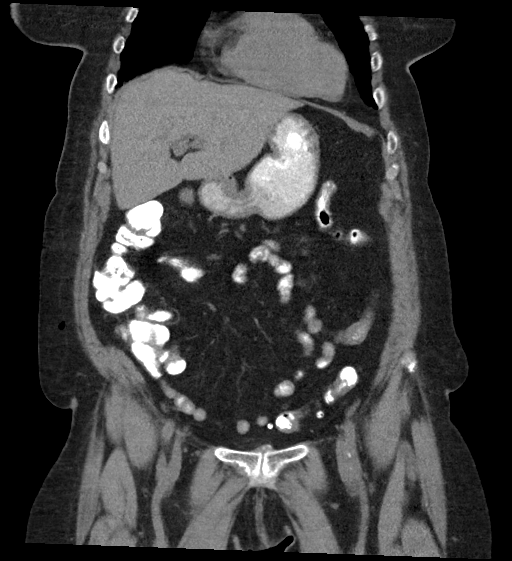
[im 42/95  soft-tissue]
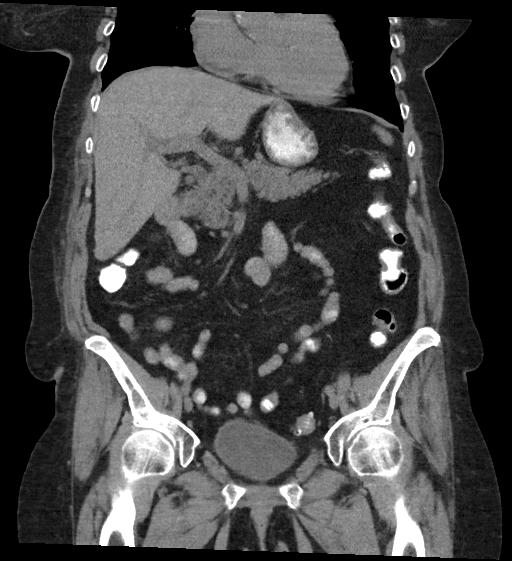
[im 53/95  soft-tissue]
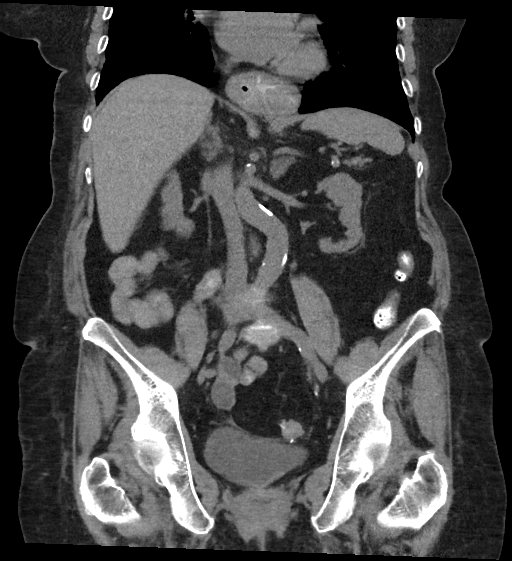

[16 of 46 positions shown; findings below may reference images not displayed]

FINDINGS: Lower chest: Bilateral patchy interstitial and ground-glass
densities are identified. These are new from previous exam and are
consistent with the recent diagnosis of COVID infection. No pleural
effusion.

Hepatobiliary: No focal liver abnormality is seen. Status post
cholecystectomy. No biliary dilatation.

Pancreas: Unremarkable. No pancreatic ductal dilatation or
surrounding inflammatory changes.

Spleen: Normal in size without focal abnormality.

Adrenals/Urinary Tract: Normal adrenal glands. Bilateral renal
cortical lobulation and thinning noted. No kidney stone, mass or
hydronephrosis identified bilaterally. Bladder appears normal.

Stomach/Bowel: Moderate to large hiatal hernia. No bowel wall
thickening, inflammation, or distension. The appendix is not
confidently identified. No secondary signs of acute appendicitis
however.

Vascular/Lymphatic: Aortic atherosclerosis. No aneurysm. No
abdominopelvic adenopathy.

Reproductive: Status post hysterectomy. Small cyst in the right
adnexa measures 2 cm, image 64/2.

Other: No free fluid or fluid collections identified within the
abdomen or pelvis.

Musculoskeletal: Degenerative disc disease identified within the
thoracolumbar spine. No acute or suspicious osseous findings
IMPRESSION: 1. No acute findings identified within the abdomen or pelvis.
2. Bilateral patchy interstitial and ground-glass densities are
identified within the lung bases compatible with the recent
diagnosis of COVID infection.
3. Hiatal hernia.
4. Aortic atherosclerosis.
5. Right ovary cyst measures 2 cm. No follow-up imaging recommended.
Note: This recommendation does not apply to premenarchal patients
and to those with increased risk (genetic, family history, elevated
tumor markers or other high-risk factors) of ovarian cancer.
Reference: JACR [DATE]):[id number]

Aortic Atherosclerosis (TZ1WP-0M5.5).

## 2022-01-13 DIAGNOSIS — R0602 Shortness of breath: Secondary | ICD-10-CM | POA: Diagnosis not present

## 2022-01-21 DIAGNOSIS — R490 Dysphonia: Secondary | ICD-10-CM | POA: Diagnosis not present

## 2022-01-21 DIAGNOSIS — I272 Pulmonary hypertension, unspecified: Secondary | ICD-10-CM | POA: Diagnosis not present

## 2022-02-05 DIAGNOSIS — R0602 Shortness of breath: Secondary | ICD-10-CM | POA: Diagnosis not present

## 2022-02-05 DIAGNOSIS — K219 Gastro-esophageal reflux disease without esophagitis: Secondary | ICD-10-CM | POA: Diagnosis not present

## 2022-02-05 DIAGNOSIS — R42 Dizziness and giddiness: Secondary | ICD-10-CM | POA: Diagnosis not present

## 2022-02-05 DIAGNOSIS — Z8679 Personal history of other diseases of the circulatory system: Secondary | ICD-10-CM | POA: Diagnosis not present

## 2022-02-05 DIAGNOSIS — I10 Essential (primary) hypertension: Secondary | ICD-10-CM | POA: Diagnosis not present

## 2022-02-05 DIAGNOSIS — J449 Chronic obstructive pulmonary disease, unspecified: Secondary | ICD-10-CM | POA: Diagnosis not present

## 2022-02-12 DIAGNOSIS — Z823 Family history of stroke: Secondary | ICD-10-CM | POA: Diagnosis not present

## 2022-02-12 DIAGNOSIS — R0989 Other specified symptoms and signs involving the circulatory and respiratory systems: Secondary | ICD-10-CM | POA: Diagnosis not present

## 2022-02-19 DIAGNOSIS — H401133 Primary open-angle glaucoma, bilateral, severe stage: Secondary | ICD-10-CM | POA: Diagnosis not present

## 2022-02-26 DIAGNOSIS — R490 Dysphonia: Secondary | ICD-10-CM | POA: Diagnosis not present

## 2022-04-06 ENCOUNTER — Emergency Department: Payer: Medicare HMO

## 2022-04-06 ENCOUNTER — Other Ambulatory Visit: Payer: Self-pay

## 2022-04-06 ENCOUNTER — Encounter: Payer: Self-pay | Admitting: *Deleted

## 2022-04-06 ENCOUNTER — Emergency Department
Admission: EM | Admit: 2022-04-06 | Discharge: 2022-04-06 | Disposition: A | Discharge status: Home / Self Care | Payer: Medicare HMO | Attending: Emergency Medicine | Admitting: Emergency Medicine

## 2022-04-06 DIAGNOSIS — K449 Diaphragmatic hernia without obstruction or gangrene: Secondary | ICD-10-CM | POA: Diagnosis not present

## 2022-04-06 DIAGNOSIS — Z8616 Personal history of COVID-19: Secondary | ICD-10-CM | POA: Insufficient documentation

## 2022-04-06 DIAGNOSIS — K5792 Diverticulitis of intestine, part unspecified, without perforation or abscess without bleeding: Secondary | ICD-10-CM | POA: Diagnosis not present

## 2022-04-06 DIAGNOSIS — R197 Diarrhea, unspecified: Secondary | ICD-10-CM | POA: Diagnosis present

## 2022-04-06 DIAGNOSIS — E871 Hypo-osmolality and hyponatremia: Secondary | ICD-10-CM | POA: Diagnosis not present

## 2022-04-06 DIAGNOSIS — E039 Hypothyroidism, unspecified: Secondary | ICD-10-CM | POA: Diagnosis not present

## 2022-04-06 DIAGNOSIS — K5732 Diverticulitis of large intestine without perforation or abscess without bleeding: Secondary | ICD-10-CM | POA: Diagnosis not present

## 2022-04-06 DIAGNOSIS — I10 Essential (primary) hypertension: Secondary | ICD-10-CM | POA: Insufficient documentation

## 2022-04-06 DIAGNOSIS — I700 Atherosclerosis of aorta: Secondary | ICD-10-CM | POA: Diagnosis not present

## 2022-04-06 DIAGNOSIS — Z20822 Contact with and (suspected) exposure to covid-19: Secondary | ICD-10-CM | POA: Insufficient documentation

## 2022-04-06 DIAGNOSIS — N390 Urinary tract infection, site not specified: Secondary | ICD-10-CM | POA: Diagnosis not present

## 2022-04-06 DIAGNOSIS — J449 Chronic obstructive pulmonary disease, unspecified: Secondary | ICD-10-CM | POA: Insufficient documentation

## 2022-04-06 DIAGNOSIS — R531 Weakness: Secondary | ICD-10-CM | POA: Insufficient documentation

## 2022-04-06 DIAGNOSIS — E876 Hypokalemia: Secondary | ICD-10-CM | POA: Insufficient documentation

## 2022-04-06 DIAGNOSIS — R109 Unspecified abdominal pain: Secondary | ICD-10-CM | POA: Diagnosis not present

## 2022-04-06 LAB — CBC
HCT: 38.5 % (ref 36.0–46.0)
Hemoglobin: 12 g/dL (ref 12.0–15.0)
MCH: 28.2 pg (ref 26.0–34.0)
MCHC: 31.2 g/dL (ref 30.0–36.0)
MCV: 90.6 fL (ref 80.0–100.0)
Platelets: 326 10*3/uL (ref 150–400)
RBC: 4.25 MIL/uL (ref 3.87–5.11)
RDW: 14.5 % (ref 11.5–15.5)
WBC: 15.5 10*3/uL — ABNORMAL HIGH (ref 4.0–10.5)
nRBC: 0 % (ref 0.0–0.2)

## 2022-04-06 LAB — BASIC METABOLIC PANEL
Anion gap: 11 (ref 5–15)
BUN: 11 mg/dL (ref 8–23)
CO2: 22 mmol/L (ref 22–32)
Calcium: 8.9 mg/dL (ref 8.9–10.3)
Chloride: 101 mmol/L (ref 98–111)
Creatinine, Ser: 0.85 mg/dL (ref 0.44–1.00)
GFR, Estimated: >60 mL/min (ref >60)
Glucose, Bld: 136 mg/dL — ABNORMAL HIGH (ref 70–99)
Potassium: 3.1 mmol/L — ABNORMAL LOW (ref 3.5–5.1)
Sodium: 134 mmol/L — ABNORMAL LOW (ref 135–145)

## 2022-04-06 LAB — PROCALCITONIN: Procalcitonin: <0.1 ng/mL

## 2022-04-06 LAB — URINALYSIS, COMPLETE (UACMP) WITH MICROSCOPIC
Bilirubin Urine: NEGATIVE
Glucose, UA: NEGATIVE mg/dL
Ketones, ur: 5 mg/dL — AB
Nitrite: POSITIVE — AB
Protein, ur: 30 mg/dL — AB
Specific Gravity, Urine: 1.016 (ref 1.005–1.030)
WBC, UA: >50 WBC/hpf — ABNORMAL HIGH (ref 0–5)
pH: 5 (ref 5.0–8.0)

## 2022-04-06 LAB — RESP PANEL BY RT-PCR (FLU A&B, COVID) ARPGX2
Influenza A by PCR: NEGATIVE
Influenza B by PCR: NEGATIVE
SARS Coronavirus 2 by RT PCR: NEGATIVE

## 2022-04-06 LAB — HEPATIC FUNCTION PANEL
ALT: 19 U/L (ref 0–44)
AST: 22 U/L (ref 15–41)
Albumin: 3.4 g/dL — ABNORMAL LOW (ref 3.5–5.0)
Alkaline Phosphatase: 66 U/L (ref 38–126)
Bilirubin, Direct: 0.4 mg/dL — ABNORMAL HIGH (ref 0.0–0.2)
Indirect Bilirubin: 1.2 mg/dL — ABNORMAL HIGH (ref 0.3–0.9)
Total Bilirubin: 1.6 mg/dL — ABNORMAL HIGH (ref 0.3–1.2)
Total Protein: 7 g/dL (ref 6.5–8.1)

## 2022-04-06 LAB — TROPONIN I (HIGH SENSITIVITY)
Troponin I (High Sensitivity): 10 ng/L (ref <18)
Troponin I (High Sensitivity): 11 ng/L (ref <18)

## 2022-04-06 MED ORDER — ONDANSETRON HCL 4 MG/2ML IJ SOLN
4.0000 mg | Freq: Once | INTRAMUSCULAR | Status: AC
Start: 2022-04-06 — End: 2022-04-06
  Administered 2022-04-06: 4 mg via INTRAVENOUS
  Filled 2022-04-06: qty 2

## 2022-04-06 MED ORDER — CEFTRIAXONE SODIUM 1 G IJ SOLR
1.0000 g | Freq: Once | INTRAMUSCULAR | Status: AC
  Administered 2022-04-06: 1 g via INTRAVENOUS
  Filled 2022-04-06: qty 10

## 2022-04-06 MED ORDER — POTASSIUM CHLORIDE CRYS ER 20 MEQ PO TBCR
40.0000 meq | EXTENDED_RELEASE_TABLET | Freq: Once | ORAL | Status: AC
  Administered 2022-04-06: 40 meq via ORAL
  Filled 2022-04-06: qty 2

## 2022-04-06 MED ORDER — METRONIDAZOLE 500 MG PO TABS
500.0000 mg | ORAL_TABLET | Freq: Once | ORAL | Status: AC
  Administered 2022-04-06: 500 mg via ORAL
  Filled 2022-04-06: qty 1

## 2022-04-06 MED ORDER — METRONIDAZOLE 500 MG PO TABS: 500.0000 mg | ORAL_TABLET | Freq: Three times a day (TID) | ORAL | 0 refills | Status: DC

## 2022-04-06 MED ORDER — SODIUM CHLORIDE 0.9 % IV BOLUS
1000.0000 mL | Freq: Once | INTRAVENOUS | Status: AC
Start: 2022-04-06 — End: 2022-04-06
  Administered 2022-04-06: 1000 mL via INTRAVENOUS

## 2022-04-06 MED ORDER — ONDANSETRON HCL 4 MG PO TABS: 4.0000 mg | ORAL_TABLET | Freq: Every day | ORAL | 0 refills | Status: AC | PRN

## 2022-04-06 MED ORDER — CIPROFLOXACIN HCL 500 MG PO TABS: 500.0000 mg | ORAL_TABLET | Freq: Two times a day (BID) | ORAL | 0 refills | Status: DC

## 2022-04-06 NOTE — ED Provider Notes (Signed)
? ?[facility] ?Provider Note ? ? ? Event Date/Time  ? First MD Initiated Contact with Patient 04/06/22 1652   ?  (approximate) ? ? ?History  ? ?Weakness ? ? ?HPI ? ?Pamela Reed is a 88 y.o. female   with past medical history of COPD, hypertension, hypothyroidism, pulmonary hypertension who presents with diarrhea nausea fatigue.  Patient has been feeling sick for about 1 week.  She initially had diarrhea and vomiting this is largely resolved but she has ongoing nausea.  Has felt fatigued.  Denies cough congestion or shortness of breath no chest pain.  She does have some mild left lower quadrant abdominal pain.  Has had decreased p.o. intake but is still tolerating p.o.  Before she got sick she had a friend who had similar diarrheal illness.  Denies headache currently did have some headaches at the beginning of illness also endorses body aches. ? ?  ? ?Past Medical History:  ?Diagnosis Date  ? Arthritis   ? spine  ? Cervicalgia   ? Chronic cystitis   ? COPD (chronic obstructive pulmonary disease) (HCC)   ? Deafness in left ear   ? s/p skull fracture - age 89  ? GERD (gastroesophageal reflux disease)   ? Headache   ? daily. s/p skull fracture as 88 yr old  ? HOH (hard of hearing)   ? right ear - 90% loss  ? Hypertension   ? Hypothyroidism   ? Mixed incontinence urge and stress   ? Vertigo   ? Wears hearing aid   ? right ear  ? ? ?Patient Active Problem List  ? Diagnosis Date Noted  ? COVID-19 virus infection 03/17/2021  ? Hyponatremia 03/17/2021  ? Hypokalemia 03/17/2021  ? Dysphagia 03/17/2021  ? HTN (hypertension) 03/17/2021  ? Hypothyroidism 03/17/2021  ? Anxiety 03/17/2021  ? Depression 03/17/2021  ? Glaucoma 03/17/2021  ? ? ? ?Physical Exam  ?Triage Vital Signs: ?ED Triage Vitals  ?Enc Vitals Group  ?   BP 04/06/22 1613 (!) 176/74  ?   Pulse Rate 04/06/22 1613 83  ?   Resp 04/06/22 1613 18  ?   Temp 04/06/22 1613 98.9 ?F (37.2 ?C)  ?   Temp Source 04/06/22 1613 Oral  ?   SpO2 04/06/22  1613 98 %  ?   Weight 04/06/22 1614 165 lb (74.8 kg)  ?   Height 04/06/22 1614 5\' 2"  (1.575 m)  ?   Head Circumference --   ?   Peak Flow --   ?   Pain Score 04/06/22 1613 6  ?   Pain Loc --   ?   Pain Edu? --   ?   Excl. in GC? --   ? ? ?Most recent vital signs: ?Vitals:  ? 04/06/22 1613 04/06/22 2114  ?BP: (!) 176/74 (!) 185/71  ?Pulse: 83 85  ?Resp: 18 20  ?Temp: 98.9 ?F (37.2 ?C) 98.8 ?F (37.1 ?C)  ?SpO2: 98% 99%  ? ? ? ?General: Awake, no distress.  Mucous membranes are dry ?CV:  Good peripheral perfusion.  No peripheral edema ?Resp:  Normal effort.  Lungs are clear no wheezing ?Abd:  No distention.  Mild tenderness to palpation left lower quadrant but overall abdomen is soft and there is no guarding ?Neuro:             Awake, Alert, Oriented x 3  ?Other:   ? ? ?ED Results / Procedures / Treatments  ?Labs ?(all labs ordered are  listed, but only abnormal results are displayed) ?Labs Reviewed  ?BASIC METABOLIC PANEL - Abnormal; Notable for the following components:  ?    Result Value  ? Sodium 134 (*)   ? Potassium 3.1 (*)   ? Glucose, Bld 136 (*)   ? All other components within normal limits  ?CBC - Abnormal; Notable for the following components:  ? WBC 15.5 (*)   ? All other components within normal limits  ?HEPATIC FUNCTION PANEL - Abnormal; Notable for the following components:  ? Albumin 3.4 (*)   ? Total Bilirubin 1.6 (*)   ? Bilirubin, Direct 0.4 (*)   ? Indirect Bilirubin 1.2 (*)   ? All other components within normal limits  ?URINALYSIS, COMPLETE (UACMP) WITH MICROSCOPIC - Abnormal; Notable for the following components:  ? Color, Urine AMBER (*)   ? APPearance CLOUDY (*)   ? Hgb urine dipstick SMALL (*)   ? Ketones, ur 5 (*)   ? Protein, ur 30 (*)   ? Nitrite POSITIVE (*)   ? Leukocytes,Ua LARGE (*)   ? WBC, UA >50 (*)   ? Bacteria, UA MANY (*)   ? Non Squamous Epithelial PRESENT (*)   ? All other components within normal limits  ?RESP PANEL BY RT-PCR (FLU A&B, COVID) [id number]  ?URINE CULTURE   ?PROCALCITONIN  ?TROPONIN I (HIGH SENSITIVITY)  ?TROPONIN I (HIGH SENSITIVITY)  ? ? ? ?EKG ? ?EKG interpreted by myself shows normal sinus rhythm normal axis normal intervals nonspecific sagging T waves in the lateral precordial leads similar to prior EKG ? ? ?RADIOLOGY ?I reviewed the CXR which does not show any acute cardiopulmonary process; agree with radiology report  ? ? ? ?PROCEDURES: ? ?Critical Care performed: No ? ?.1-3 Lead EKG Interpretation ?Performed by: Georga Hacking, MD ?Authorized by: Georga Hacking, MD  ? ?  Interpretation: normal   ?  ECG rate assessment: normal   ?  Ectopy: none   ?  Conduction: normal   ? ?The patient is on the cardiac monitor to evaluate for evidence of arrhythmia and/or significant heart rate changes. ? ? ?MEDICATIONS ORDERED IN ED: ?Medications  ?sodium chloride 0.9 % bolus 1,000 mL (0 mLs Intravenous Stopped 04/06/22 2124)  ?ondansetron [facility]) injection 4 mg (4 mg Intravenous Given 04/06/22 1829)  ?cefTRIAXone (ROCEPHIN) 1 g in sodium chloride 0.9 % 100 mL IVPB (0 g Intravenous Stopped 04/06/22 2012)  ?potassium chloride SA (KLOR-CON M) CR tablet 40 mEq (40 mEq Oral Given 04/06/22 2011)  ?metroNIDAZOLE (FLAGYL) tablet 500 mg (500 mg Oral Given 04/06/22 2011)  ? ? ? ?IMPRESSION / MDM / ASSESSMENT AND PLAN / ED COURSE  ?I reviewed the triage vital signs and the nursing notes. ?             ?               ? ?Differential diagnosis includes, but is not limited to, COVID-19, other viral illness, diverticulitis, UTI, electrolyte abnormality, AKI ? ?Is a 88 year old female presents with multiple symptoms including fatigue nausea diarrhea abdominal pain.  Symptoms have been for about a week initially started with diarrhea and vomiting predominantly this is resolved but she has ongoing nausea.  She also endorses some lower abdominal pain worse in the left side mild pressure with urination.  No fevers but has had some chills.  Has been quite fatigued with generalized body  aches.  Mild headache that is now resolved.  Still tolerating p.o. which is eating  less.  Friend had similar illness prior to her becoming sick.  Her vitals are overall reassuring she is hypertensive but otherwise within normal limits.  She appears dry but is nontoxic abdomen is mildly tender in the left lower quadrant lungs are clear.  Overall I suspect viral illness.  Will test for COVID influenza check chest x-ray CT abdomen pelvis given the left lower quadrant abdominal pain and her age urine and labs.  Will place an IV give fluids Zofran for nausea. ? ?Patient's labs are notable for leukocytosis of 15, creatinine is within normal limits, he is mildly hypokalemic with a potassium of 3.1 just mild hyponatremia 134 T. bili 1.6 chest x-ray does not show any evidence of bacterial pneumonia. ? ? ?Patient's UA is grossly positive with greater than 50 WBCs nitrate positive.  Reviewed most recent culture growing Citrobacter sensitive to ceftriaxone.  We will give a dose of Rocephin. ? ?Patient CT shows mild diverticulitis.  This is consistent with patient's left lower quadrant pain.  We will treat for both UTI and diverticulitis with Cipro and Flagyl which should cover both.  Discussed findings with the patient.  Given her stable vital signs tolerating p.o. and reassuring work-up lab wise I think she is appropriate for outpatient treatment.  We discussed return precautions.  We will also prescribe Zofran for nausea ?Clinical Course as of 04/06/22 2355  ?Mon Apr 06, 2022  ?1811 Procalcitonin: <0.10 [KM]  ?1835 Procalcitonin: <0.10 [KM]  ?  ?Clinical Course User Index ?[KM] Georga Hacking, MD  ? ? ? ?FINAL CLINICAL IMPRESSION(S) / ED DIAGNOSES  ? ?Final diagnoses:  ?Urinary tract infection without hematuria, site unspecified  ?Diverticulitis  ? ? ? ?Rx / DC Orders  ? ?ED Discharge Orders   ? ?      Ordered  ?  ciprofloxacin (CIPRO) 500 MG tablet  2 times daily       ? 04/06/22 1938  ?  metroNIDAZOLE (FLAGYL) 500 MG  tablet  3 times daily       ? 04/06/22 1938  ?  ondansetron (ZOFRAN) 4 MG tablet  Daily PRN       ? 04/06/22 1939  ? ?  ?  ? ?  ? ? ? ?Note:  This document was prepared using Conservation officer, historic buildings and may includ

## 2022-04-06 NOTE — Discharge Instructions (Addendum)
Your CAT scan shows that you have mild diverticulitis which is inflammation of the left side of your colon.  Your urine sample also is positive for urinary tract infection all of which could be contributing to your symptoms.  Please take the 2 antibiotics for the next 7 days which will cover you both for diverticulitis and for UTI.  I have also written you a prescription for Zofran for nausea.  Return to the emergency department if you are unable to eat or drink you have worsening abdominal pain or fevers that are not resolving.  Otherwise please follow-up with your primary care provider. ?

## 2022-04-06 NOTE — ED Triage Notes (Signed)
Pt reports feeling weak, with n/v/d, sob for 1 week.  Pt sent from [facility] for eval of weakness.  Pt taking meds for diarrhea.  Pt also reports abd pain.  Pt alert   family with pt ?

## 2022-04-06 NOTE — ED Notes (Signed)
Pt A&O, IV removed, pt given discharge instructions, pt leaving with family member. ?

## 2022-04-11 ENCOUNTER — Inpatient Hospital Stay
Admission: EM | Admit: 2022-04-11 | Discharge: 2022-04-14 | DRG: 392 | Disposition: A | Discharge status: Skilled Nursing Facility | Payer: Medicare HMO | Attending: Internal Medicine | Admitting: Internal Medicine

## 2022-04-11 ENCOUNTER — Other Ambulatory Visit: Payer: Self-pay

## 2022-04-11 DIAGNOSIS — R509 Fever, unspecified: Secondary | ICD-10-CM | POA: Diagnosis not present

## 2022-04-11 DIAGNOSIS — J449 Chronic obstructive pulmonary disease, unspecified: Secondary | ICD-10-CM | POA: Diagnosis not present

## 2022-04-11 DIAGNOSIS — R54 Age-related physical debility: Secondary | ICD-10-CM | POA: Diagnosis not present

## 2022-04-11 DIAGNOSIS — R1312 Dysphagia, oropharyngeal phase: Secondary | ICD-10-CM | POA: Diagnosis not present

## 2022-04-11 DIAGNOSIS — R2681 Unsteadiness on feet: Secondary | ICD-10-CM | POA: Diagnosis not present

## 2022-04-11 DIAGNOSIS — H409 Unspecified glaucoma: Secondary | ICD-10-CM | POA: Diagnosis not present

## 2022-04-11 DIAGNOSIS — M6259 Muscle wasting and atrophy, not elsewhere classified, multiple sites: Secondary | ICD-10-CM | POA: Diagnosis not present

## 2022-04-11 DIAGNOSIS — Z885 Allergy status to narcotic agent status: Secondary | ICD-10-CM

## 2022-04-11 DIAGNOSIS — I10 Essential (primary) hypertension: Secondary | ICD-10-CM | POA: Diagnosis present

## 2022-04-11 DIAGNOSIS — F419 Anxiety disorder, unspecified: Secondary | ICD-10-CM | POA: Diagnosis present

## 2022-04-11 DIAGNOSIS — N390 Urinary tract infection, site not specified: Secondary | ICD-10-CM | POA: Diagnosis present

## 2022-04-11 DIAGNOSIS — Z6831 Body mass index (BMI) 31.0-31.9, adult: Secondary | ICD-10-CM

## 2022-04-11 DIAGNOSIS — R531 Weakness: Secondary | ICD-10-CM

## 2022-04-11 DIAGNOSIS — N3946 Mixed incontinence: Secondary | ICD-10-CM | POA: Diagnosis present

## 2022-04-11 DIAGNOSIS — F32A Depression, unspecified: Secondary | ICD-10-CM | POA: Diagnosis not present

## 2022-04-11 DIAGNOSIS — Z79899 Other long term (current) drug therapy: Secondary | ICD-10-CM | POA: Diagnosis not present

## 2022-04-11 DIAGNOSIS — K5732 Diverticulitis of large intestine without perforation or abscess without bleeding: Secondary | ICD-10-CM | POA: Diagnosis not present

## 2022-04-11 DIAGNOSIS — R41841 Cognitive communication deficit: Secondary | ICD-10-CM | POA: Diagnosis not present

## 2022-04-11 DIAGNOSIS — E876 Hypokalemia: Secondary | ICD-10-CM | POA: Diagnosis present

## 2022-04-11 DIAGNOSIS — E039 Hypothyroidism, unspecified: Secondary | ICD-10-CM | POA: Diagnosis not present

## 2022-04-11 DIAGNOSIS — Z880 Allergy status to penicillin: Secondary | ICD-10-CM | POA: Diagnosis not present

## 2022-04-11 DIAGNOSIS — Z7401 Bed confinement status: Secondary | ICD-10-CM | POA: Diagnosis not present

## 2022-04-11 DIAGNOSIS — Z91041 Radiographic dye allergy status: Secondary | ICD-10-CM | POA: Diagnosis not present

## 2022-04-11 DIAGNOSIS — Z743 Need for continuous supervision: Secondary | ICD-10-CM | POA: Diagnosis not present

## 2022-04-11 DIAGNOSIS — Z789 Other specified health status: Secondary | ICD-10-CM

## 2022-04-11 DIAGNOSIS — Z888 Allergy status to other drugs, medicaments and biological substances status: Secondary | ICD-10-CM | POA: Diagnosis not present

## 2022-04-11 DIAGNOSIS — Z741 Need for assistance with personal care: Secondary | ICD-10-CM | POA: Diagnosis not present

## 2022-04-11 DIAGNOSIS — R262 Difficulty in walking, not elsewhere classified: Secondary | ICD-10-CM | POA: Diagnosis not present

## 2022-04-11 DIAGNOSIS — H9192 Unspecified hearing loss, left ear: Secondary | ICD-10-CM | POA: Diagnosis present

## 2022-04-11 DIAGNOSIS — K5792 Diverticulitis of intestine, part unspecified, without perforation or abscess without bleeding: Secondary | ICD-10-CM | POA: Diagnosis present

## 2022-04-11 DIAGNOSIS — E785 Hyperlipidemia, unspecified: Secondary | ICD-10-CM | POA: Diagnosis not present

## 2022-04-11 DIAGNOSIS — Z91138 Patient's unintentional underdosing of medication regimen for other reason: Secondary | ICD-10-CM

## 2022-04-11 DIAGNOSIS — K219 Gastro-esophageal reflux disease without esophagitis: Secondary | ICD-10-CM | POA: Diagnosis not present

## 2022-04-11 DIAGNOSIS — E569 Vitamin deficiency, unspecified: Secondary | ICD-10-CM | POA: Diagnosis not present

## 2022-04-11 DIAGNOSIS — Z882 Allergy status to sulfonamides status: Secondary | ICD-10-CM | POA: Diagnosis not present

## 2022-04-11 DIAGNOSIS — E669 Obesity, unspecified: Secondary | ICD-10-CM | POA: Diagnosis not present

## 2022-04-11 DIAGNOSIS — Z7951 Long term (current) use of inhaled steroids: Secondary | ICD-10-CM

## 2022-04-11 DIAGNOSIS — R296 Repeated falls: Secondary | ICD-10-CM | POA: Diagnosis not present

## 2022-04-11 DIAGNOSIS — Z90710 Acquired absence of both cervix and uterus: Secondary | ICD-10-CM

## 2022-04-11 DIAGNOSIS — M479 Spondylosis, unspecified: Secondary | ICD-10-CM | POA: Diagnosis present

## 2022-04-11 DIAGNOSIS — Z9049 Acquired absence of other specified parts of digestive tract: Secondary | ICD-10-CM

## 2022-04-11 DIAGNOSIS — R5381 Other malaise: Secondary | ICD-10-CM

## 2022-04-11 DIAGNOSIS — Z7982 Long term (current) use of aspirin: Secondary | ICD-10-CM

## 2022-04-11 DIAGNOSIS — T373X6A Underdosing of other antiprotozoal drugs, initial encounter: Secondary | ICD-10-CM | POA: Diagnosis present

## 2022-04-11 DIAGNOSIS — J45998 Other asthma: Secondary | ICD-10-CM | POA: Diagnosis not present

## 2022-04-11 DIAGNOSIS — I272 Pulmonary hypertension, unspecified: Secondary | ICD-10-CM | POA: Diagnosis not present

## 2022-04-11 DIAGNOSIS — T368X6A Underdosing of other systemic antibiotics, initial encounter: Secondary | ICD-10-CM | POA: Diagnosis present

## 2022-04-11 LAB — CBC WITH DIFFERENTIAL/PLATELET
Abs Immature Granulocytes: 0.05 10*3/uL (ref 0.00–0.07)
Basophils Absolute: 0.1 10*3/uL (ref 0.0–0.1)
Basophils Relative: 1 %
Eosinophils Absolute: 0 10*3/uL (ref 0.0–0.5)
Eosinophils Relative: 0 %
HCT: 34.5 % — ABNORMAL LOW (ref 36.0–46.0)
Hemoglobin: 11 g/dL — ABNORMAL LOW (ref 12.0–15.0)
Immature Granulocytes: 0 %
Lymphocytes Relative: 16 %
Lymphs Abs: 2 10*3/uL (ref 0.7–4.0)
MCH: 28.6 pg (ref 26.0–34.0)
MCHC: 31.9 g/dL (ref 30.0–36.0)
MCV: 89.6 fL (ref 80.0–100.0)
Monocytes Absolute: 1.2 10*3/uL — ABNORMAL HIGH (ref 0.1–1.0)
Monocytes Relative: 10 %
Neutro Abs: 9 10*3/uL — ABNORMAL HIGH (ref 1.7–7.7)
Neutrophils Relative %: 73 %
Platelets: 353 10*3/uL (ref 150–400)
RBC: 3.85 MIL/uL — ABNORMAL LOW (ref 3.87–5.11)
RDW: 14.5 % (ref 11.5–15.5)
WBC: 12.4 10*3/uL — ABNORMAL HIGH (ref 4.0–10.5)
nRBC: 0 % (ref 0.0–0.2)

## 2022-04-11 LAB — COMPREHENSIVE METABOLIC PANEL
ALT: 13 U/L (ref 0–44)
AST: 17 U/L (ref 15–41)
Albumin: 3 g/dL — ABNORMAL LOW (ref 3.5–5.0)
Alkaline Phosphatase: 54 U/L (ref 38–126)
Anion gap: 8 (ref 5–15)
BUN: 7 mg/dL — ABNORMAL LOW (ref 8–23)
CO2: 25 mmol/L (ref 22–32)
Calcium: 8.3 mg/dL — ABNORMAL LOW (ref 8.9–10.3)
Chloride: 101 mmol/L (ref 98–111)
Creatinine, Ser: 0.8 mg/dL (ref 0.44–1.00)
GFR, Estimated: >60 mL/min (ref >60)
Glucose, Bld: 119 mg/dL — ABNORMAL HIGH (ref 70–99)
Potassium: 2.8 mmol/L — ABNORMAL LOW (ref 3.5–5.1)
Sodium: 134 mmol/L — ABNORMAL LOW (ref 135–145)
Total Bilirubin: 1 mg/dL (ref 0.3–1.2)
Total Protein: 6.5 g/dL (ref 6.5–8.1)

## 2022-04-11 LAB — URINALYSIS, ROUTINE W REFLEX MICROSCOPIC
Bilirubin Urine: NEGATIVE
Glucose, UA: NEGATIVE mg/dL
Hgb urine dipstick: NEGATIVE
Ketones, ur: 5 mg/dL — AB
Leukocytes,Ua: NEGATIVE
Nitrite: NEGATIVE
Protein, ur: NEGATIVE mg/dL
Specific Gravity, Urine: 1.005 (ref 1.005–1.030)
pH: 7 (ref 5.0–8.0)

## 2022-04-11 LAB — TROPONIN I (HIGH SENSITIVITY): Troponin I (High Sensitivity): 14 ng/L (ref <18)

## 2022-04-11 LAB — LACTIC ACID, PLASMA: Lactic Acid, Venous: 1.1 mmol/L (ref 0.5–1.9)

## 2022-04-11 MED ORDER — OXYCODONE HCL 5 MG PO TABS: 5.0000 mg | ORAL_TABLET | ORAL | Status: DC | PRN

## 2022-04-11 MED ORDER — ACETAMINOPHEN 325 MG PO TABS
650.0000 mg | ORAL_TABLET | Freq: Four times a day (QID) | ORAL | Status: DC | PRN
  Administered 2022-04-11 – 2022-04-13 (×3): 650 mg via ORAL
  Filled 2022-04-11 (×4): qty 2

## 2022-04-11 MED ORDER — HYDRALAZINE HCL 20 MG/ML IJ SOLN
10.0000 mg | Freq: Four times a day (QID) | INTRAMUSCULAR | Status: DC | PRN
  Administered 2022-04-12: 10 mg via INTRAVENOUS
  Filled 2022-04-11: qty 1

## 2022-04-11 MED ORDER — FLUTICASONE FUROATE-VILANTEROL 100-25 MCG/ACT IN AEPB
1.0000 | INHALATION_SPRAY | Freq: Every day | RESPIRATORY_TRACT | Status: DC
  Administered 2022-04-12 – 2022-04-14 (×3): 1 via RESPIRATORY_TRACT
  Filled 2022-04-11: qty 28

## 2022-04-11 MED ORDER — OMEPRAZOLE 20 MG PO CPDR: 20.0000 mg | DELAYED_RELEASE_CAPSULE | Freq: Every day | ORAL | Status: DC | PRN

## 2022-04-11 MED ORDER — POLYETHYLENE GLYCOL 3350 17 G PO PACK: 17.0000 g | PACK | Freq: Every day | ORAL | Status: DC | PRN

## 2022-04-11 MED ORDER — ONDANSETRON HCL 4 MG/2ML IJ SOLN
4.0000 mg | Freq: Four times a day (QID) | INTRAMUSCULAR | Status: DC | PRN
  Administered 2022-04-12: 4 mg via INTRAVENOUS
  Filled 2022-04-11: qty 2

## 2022-04-11 MED ORDER — CIPROFLOXACIN IN D5W 400 MG/200ML IV SOLN
400.0000 mg | Freq: Two times a day (BID) | INTRAVENOUS | Status: DC
  Administered 2022-04-11 – 2022-04-14 (×6): 400 mg via INTRAVENOUS
  Filled 2022-04-11 (×7): qty 200

## 2022-04-11 MED ORDER — TRAZODONE HCL 50 MG PO TABS
50.0000 mg | ORAL_TABLET | Freq: Every evening | ORAL | Status: DC | PRN
  Administered 2022-04-12: 50 mg via ORAL
  Filled 2022-04-11: qty 1

## 2022-04-11 MED ORDER — NETARSUDIL DIMESYLATE 0.02 % OP SOLN
1.0000 [drp] | Freq: Every day | OPHTHALMIC | Status: DC
  Administered 2022-04-11 – 2022-04-13 (×3): 1 [drp] via OPHTHALMIC
  Filled 2022-04-11 (×2): qty 2.5

## 2022-04-11 MED ORDER — LATANOPROSTENE BUNOD 0.024 % OP SOLN
1.0000 [drp] | Freq: Every day | OPHTHALMIC | Status: DC
  Administered 2022-04-11 – 2022-04-13 (×3): 1 [drp] via OPHTHALMIC
  Filled 2022-04-11 (×2): qty 2.5

## 2022-04-11 MED ORDER — FLUOXETINE HCL 20 MG PO TABS
10.0000 mg | ORAL_TABLET | Freq: Every day | ORAL | Status: DC
  Filled 2022-04-11: qty 1

## 2022-04-11 MED ORDER — ALBUTEROL SULFATE (2.5 MG/3ML) 0.083% IN NEBU: 2.5000 mg | INHALATION_SOLUTION | Freq: Four times a day (QID) | RESPIRATORY_TRACT | Status: DC | PRN

## 2022-04-11 MED ORDER — ALBUTEROL SULFATE HFA 108 (90 BASE) MCG/ACT IN AERS: 2.0000 | INHALATION_SPRAY | Freq: Four times a day (QID) | RESPIRATORY_TRACT | Status: DC | PRN

## 2022-04-11 MED ORDER — ONDANSETRON HCL 4 MG PO TABS
4.0000 mg | ORAL_TABLET | Freq: Four times a day (QID) | ORAL | Status: DC | PRN
  Administered 2022-04-14: 4 mg via ORAL
  Filled 2022-04-11: qty 1

## 2022-04-11 MED ORDER — ACETAMINOPHEN 650 MG RE SUPP: 650.0000 mg | Freq: Four times a day (QID) | RECTAL | Status: DC | PRN

## 2022-04-11 MED ORDER — METOPROLOL SUCCINATE ER 25 MG PO TB24
25.0000 mg | ORAL_TABLET | Freq: Two times a day (BID) | ORAL | Status: DC
  Administered 2022-04-11 – 2022-04-14 (×6): 25 mg via ORAL
  Filled 2022-04-11 (×6): qty 1

## 2022-04-11 MED ORDER — SODIUM CHLORIDE 0.9 % IV BOLUS
500.0000 mL | Freq: Once | INTRAVENOUS | Status: AC
  Administered 2022-04-11: 500 mL via INTRAVENOUS

## 2022-04-11 MED ORDER — METRONIDAZOLE 500 MG/100ML IV SOLN
500.0000 mg | Freq: Three times a day (TID) | INTRAVENOUS | Status: DC
  Administered 2022-04-11 – 2022-04-14 (×9): 500 mg via INTRAVENOUS
  Filled 2022-04-11 (×10): qty 100

## 2022-04-11 MED ORDER — LEVOTHYROXINE SODIUM 50 MCG PO TABS
50.0000 ug | ORAL_TABLET | Freq: Every day | ORAL | Status: DC
  Administered 2022-04-12 – 2022-04-14 (×3): 50 ug via ORAL
  Filled 2022-04-11 (×3): qty 1

## 2022-04-11 MED ORDER — LISINOPRIL 20 MG PO TABS
20.0000 mg | ORAL_TABLET | Freq: Two times a day (BID) | ORAL | Status: DC
  Administered 2022-04-11 – 2022-04-14 (×6): 20 mg via ORAL
  Filled 2022-04-11 (×6): qty 1

## 2022-04-11 MED ORDER — ENOXAPARIN SODIUM 40 MG/0.4ML IJ SOSY
40.0000 mg | PREFILLED_SYRINGE | INTRAMUSCULAR | Status: DC
  Administered 2022-04-11 – 2022-04-13 (×3): 40 mg via SUBCUTANEOUS
  Filled 2022-04-11 (×3): qty 0.4

## 2022-04-11 MED ORDER — ALPRAZOLAM 0.25 MG PO TABS
0.2500 mg | ORAL_TABLET | Freq: Two times a day (BID) | ORAL | Status: DC | PRN
  Administered 2022-04-13: 0.25 mg via ORAL
  Filled 2022-04-11: qty 1

## 2022-04-11 MED ORDER — POTASSIUM CHLORIDE CRYS ER 20 MEQ PO TBCR
40.0000 meq | EXTENDED_RELEASE_TABLET | Freq: Once | ORAL | Status: AC
  Administered 2022-04-11: 40 meq via ORAL
  Filled 2022-04-11: qty 2

## 2022-04-11 MED ORDER — LACTATED RINGERS IV SOLN: INTRAVENOUS | Status: DC

## 2022-04-11 NOTE — ED Notes (Signed)
Pt reports she is supposed to take BP meds, but has not taken them today.

## 2022-04-11 NOTE — H&P (Signed)
History and Physical    Patient: Pamela Reed [place] BJY:[id number] DOB: 10/26/1931 DOA: 04/11/2022 DOS: the patient was seen and examined on 04/11/2022 PCP: Alan MulderMorayati, Shamil J, MD  Patient coming from: Home  Chief Complaint:  Chief Complaint  Patient presents with   Fall   HPI: Pamela Reed Saltzman is a 88 y.o. female with medical history significant for hypertension, hypothyroidism, anxiety, and recent ED visit where she was diagnosed with diverticulitis, who presents with worsening symptoms and inability to take p.o.  Patient was seen 5 days prior on May 15, at that time she reported diarrhea, vomiting, and left lower quadrant abdominal pain.  Underwent a CT scan which showed acute uncomplicated diverticulitis.  She was able to take p.o. at that time and overall well-appearing, she was discharged home on p.o. antibiotics.  Today presents reporting she has fallen multiple times.  She reports that since her ED presentation she has felt progressively weaker and had trouble taking p.o.  She has tried to keep up with water intake.  She reports she has fallen twice, has not hit her head either time.  Has gotten so difficult that she cannot even make it to the bathroom on her own.  She was taking the antibiotic she was prescribed but has not taken them for about 3 days at this point.  Abdominal pain is still present but has lessened slightly since she was last seen.  In the ED initial vital signs notable for hypertension, but otherwise unremarkable.  CMP showed hypokalemia with potassium 2.8, otherwise unremarkable.  Lactic acid and troponin were normal.  CBC showed downtrending white count from 15-12, mild decrease in hemoglobin from 12-11, otherwise unremarkable.  EKG was obtained which was unremarkable with no ischemic changes and unchanged compared to prior.  Her potassium was repleted and I was consulted to admit her for further management due to worsening symptoms and inability to care for self at  home.   Review of Systems: As mentioned in the history of present illness. All other systems reviewed and are negative. Past Medical History:  Diagnosis Date   Arthritis    spine   Cervicalgia    Chronic cystitis    COPD (chronic obstructive pulmonary disease) (HCC)    Deafness in left ear    s/p skull fracture - age 89   GERD (gastroesophageal reflux disease)    Headache    daily. 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   Hypertension    Hypothyroidism    Mixed incontinence urge and stress    Vertigo    Wears hearing aid    right ear   Past Surgical History:  Procedure Laterality Date   ABDOMINAL HYSTERECTOMY     CATARACT EXTRACTION W/ INTRAOCULAR LENS IMPLANT     CHOLECYSTECTOMY     COLONOSCOPY     ESOPHAGOGASTRODUODENOSCOPY (EGD) WITH PROPOFOL N/A 05/12/2021   Procedure: ESOPHAGOGASTRODUODENOSCOPY (EGD) WITH PROPOFOL;  Surgeon: Regis BillLocklear, Cameron T, MD;  Location: [facility];  Service: Endoscopy;  Laterality: N/A;   EYE SURGERY Bilateral    eye shunts   FOOT SURGERY     GLAUCOMA SURGERY     PHOTOCOAGULATION WITH LASER Left 09/07/2016   Procedure: PHOTOCOAGULATION WITH LASER;  Surgeon: Sherald HessAnita Prakash Vin-Parikh, MD;  [facility];  Service: Ophthalmology;  Laterality: Left;  LEFT   TONSILLECTOMY     Social History:  reports that she has never smoked. She  has never used smokeless tobacco. She reports that she does not drink alcohol and does not use drugs.  Allergies  Allergen Reactions   Contrast Media [Iodinated Contrast Media] Shortness Of Breath   Iodine Shortness Of Breath   Sulfasalazine Itching and Swelling   Dye Fdc Red [Red Dye]    Lactose Intolerance (Gi)     GI upset   Penicillins     Childhood event-unknown reaction   Shellfish Allergy Nausea Only and Swelling    Throat swelling   Sulfa Antibiotics Itching and Swelling   Venlafaxine Swelling    Throat   Codeine Palpitations   Ropinirole  Nausea Only    No family history on file.  Prior to Admission medications   Medication Sig Start Date End Date Taking? Authorizing Provider  albuterol (VENTOLIN HFA) 108 (90 Base) MCG/ACT inhaler Inhale 2 puffs into the lungs every 6 (six) hours as needed. 05/17/20   [provider]  ALPRAZolam Duanne Moron) 0.25 MG tablet Take 0.25 mg by mouth 2 (two) times daily as needed. 03/07/21   [provider]  aspirin 81 MG tablet Take 81 mg by mouth daily.    [provider]  ciprofloxacin (CIPRO) 500 MG tablet Take 1 tablet (500 mg total) by mouth 2 (two) times daily for 7 days. 04/06/22 04/13/22  Rada Hay, MD  FLUoxetine (PROZAC) 10 MG tablet Take 10 mg by mouth daily.     [provider]  furosemide (LASIX) 20 MG tablet Take 20 mg by mouth daily.     [provider]  levothyroxine (SYNTHROID, LEVOTHROID) 50 MCG tablet Take 50 mcg by mouth daily before breakfast.    [provider]  lisinopril (PRINIVIL,ZESTRIL) 20 MG tablet Take 20 mg by mouth 2 (two) times daily.     [provider]  Magnesium 500 MG TABS Take 500 mg by mouth daily.    [provider]  metoprolol succinate (TOPROL-XL) 25 MG 24 hr tablet Take 25 mg by mouth 2 (two) times daily.     [provider]  metroNIDAZOLE (FLAGYL) 500 MG tablet Take 1 tablet (500 mg total) by mouth 3 (three) times daily for 7 days. 04/06/22 04/13/22  Rada Hay, MD  Netarsudil Dimesylate (RHOPRESSA) 0.02 % SOLN Place 1 drop into both eyes at bedtime.    [provider]  omeprazole (PRILOSEC OTC) 20 MG tablet Take 20 mg by mouth daily as needed (heartburn).    [provider]  ondansetron (ZOFRAN) 4 MG tablet Take 1 tablet (4 mg total) by mouth every 8 (eight) hours as needed for nausea or vomiting. 03/20/21   Patrecia Pour, MD  ondansetron (ZOFRAN) 4 MG tablet Take 1 tablet (4 mg total) by mouth daily as needed for up to 10 days for nausea or vomiting.  04/06/22 04/16/22  Rada Hay, MD  SYMBICORT 80-4.5 MCG/ACT inhaler Inhale into the lungs. 02/24/21   [provider]  VYZULTA 0.024 % SOLN Place 1 drop into both eyes at bedtime.     [provider]    Physical Exam: Vitals:   04/11/22 1430 04/11/22 1500 04/11/22 1630 04/11/22 1707  BP: (!) 171/73 (!) 186/78 (!) 196/77 (!) 199/83  Pulse: 80 82 81 81  Resp: (!) 23 15 16 18   Temp:   98.1 F (36.7 Reed) 98.4 F (36.9 Reed)  TempSrc:   Oral   SpO2: 97% 100% 97% 97%  Weight:      Height:  Physical Exam Constitutional:      General: She is not in acute distress.    Appearance: Normal appearance. She is not ill-appearing or toxic-appearing.     Comments: Eating liquid diet tray  Eyes:     General: No scleral icterus. Cardiovascular:     Rate and Rhythm: Normal rate and regular rhythm.  Pulmonary:     Effort: Pulmonary effort is normal. No respiratory distress.     Breath sounds: Normal breath sounds. No wheezing or rales.  Abdominal:     General: Abdomen is flat. Bowel sounds are normal. There is no distension.     Palpations: Abdomen is soft. There is no mass.     Tenderness: There is abdominal tenderness.     Comments: Mild ttp in LLQ  Skin:    General: Skin is warm and dry.  Neurological:     General: No focal deficit present.     Mental Status: She is alert.  Psychiatric:        Mood and Affect: Mood normal.        Behavior: Behavior normal.     Data Reviewed:  There are no new results to review at this time.  Results for orders placed or performed during the hospital encounter of 04/11/22 (from the past 24 hour(s))  Comprehensive metabolic panel     Status: Abnormal   Collection Time: 04/11/22  2:37 PM  Result Value Ref Range   Sodium 134 (L) 135 - 145 mmol/L   Potassium 2.8 (L) 3.5 - 5.1 mmol/L   Chloride 101 98 - 111 mmol/L   CO2 25 22 - 32 mmol/L   Glucose, Bld 119 (H) 70 - 99 mg/dL   BUN 7 (L) 8 - 23 mg/dL   Creatinine, Ser 9.62  0.44 - 1.00 mg/dL   Calcium 8.3 (L) 8.9 - 10.3 mg/dL   Total Protein 6.5 6.5 - 8.1 g/dL   Albumin 3.0 (L) 3.5 - 5.0 g/dL   AST 17 15 - 41 U/L   ALT 13 0 - 44 U/L   Alkaline Phosphatase 54 38 - 126 U/L   Total Bilirubin 1.0 0.3 - 1.2 mg/dL   GFR, Estimated >83 >66 mL/min   Anion gap 8 5 - 15  CBC with Differential     Status: Abnormal   Collection Time: 04/11/22  2:37 PM  Result Value Ref Range   WBC 12.4 (H) 4.0 - 10.5 K/uL   RBC 3.85 (L) 3.87 - 5.11 MIL/uL   Hemoglobin 11.0 (L) 12.0 - 15.0 g/dL   HCT 29.4 (L) 76.5 - 46.5 %   MCV 89.6 80.0 - 100.0 fL   MCH 28.6 26.0 - 34.0 pg   MCHC 31.9 30.0 - 36.0 g/dL   RDW 03.5 46.5 - 68.1 %   Platelets 353 150 - 400 K/uL   nRBC 0.0 0.0 - 0.2 %   Neutrophils Relative % 73 %   Neutro Abs 9.0 (H) 1.7 - 7.7 K/uL   Lymphocytes Relative 16 %   Lymphs Abs 2.0 0.7 - 4.0 K/uL   Monocytes Relative 10 %   Monocytes Absolute 1.2 (H) 0.1 - 1.0 K/uL   Eosinophils Relative 0 %   Eosinophils Absolute 0.0 0.0 - 0.5 K/uL   Basophils Relative 1 %   Basophils Absolute 0.1 0.0 - 0.1 K/uL   Immature Granulocytes 0 %   Abs Immature Granulocytes 0.05 0.00 - 0.07 K/uL  Troponin I (High Sensitivity)     Status: None  Collection Time: 04/11/22  2:37 PM  Result Value Ref Range   Troponin I (High Sensitivity) 14 <18 ng/L  Lactic acid, plasma     Status: None   Collection Time: 04/11/22  2:37 PM  Result Value Ref Range   Lactic Acid, Venous 1.1 0.5 - 1.9 mmol/L  Urinalysis, Routine w reflex microscopic Urine, Clean Catch     Status: Abnormal   Collection Time: 04/11/22  2:37 PM  Result Value Ref Range   Color, Urine YELLOW (A) YELLOW   APPearance CLEAR (A) CLEAR   Specific Gravity, Urine 1.005 1.005 - 1.030   pH 7.0 5.0 - 8.0   Glucose, UA NEGATIVE NEGATIVE mg/dL   Hgb urine dipstick NEGATIVE NEGATIVE   Bilirubin Urine NEGATIVE NEGATIVE   Ketones, ur 5 (A) NEGATIVE mg/dL   Protein, ur NEGATIVE NEGATIVE mg/dL   Nitrite NEGATIVE NEGATIVE   Leukocytes,Ua  NEGATIVE NEGATIVE   CT ABDOMEN PELVIS WO CONTRAST  Result Date: 04/06/2022 CLINICAL DATA:  Left lower quadrant pain EXAM: CT ABDOMEN AND PELVIS WITHOUT CONTRAST TECHNIQUE: Multidetector CT imaging of the abdomen and pelvis was performed following the standard protocol without IV contrast. RADIATION DOSE REDUCTION: This exam was performed according to the departmental dose-optimization program which includes automated exposure control, adjustment of the mA and/or kV according to patient size and/or use of iterative reconstruction technique. COMPARISON:  CT abdomen and pelvis 03/18/2021 FINDINGS: Lower chest: Chronic changes in the lower lungs.  Cardiomegaly. Hepatobiliary: Liver is normal in size and contour with no suspicious mass identified. Gallbladder is surgically absent. No biliary ductal dilatation identified. Pancreas: Unremarkable. No pancreatic ductal dilatation or surrounding inflammatory changes. Spleen: Normal in size without focal abnormality. Adrenals/Urinary Tract: Adrenal glands appear normal. No nephrolithiasis or hydronephrosis identified bilaterally. Urinary bladder is normal. Stomach/Bowel: Moderate-sized hiatal hernia. No bowel obstruction, free air or pneumatosis. Colonic diverticulosis. There is mild inflammatory fat stranding surrounding the distal sigmoid colon consistent with acute diverticulitis. No associated abscess. Appendix not visualized. Vascular/Lymphatic: Aortic atherosclerosis. No enlarged abdominal or pelvic lymph nodes. Reproductive: Status post hysterectomy. No adnexal masses. Other: No ascites. Musculoskeletal: Degenerative changes in the spine. No suspicious bony lesions identified. IMPRESSION: 1. Colonic diverticulosis with mild acute diverticulitis at the distal sigmoid colon. 2. Moderate-sized hiatal hernia. Electronically Signed   By: Ofilia Neas M.D.   On: 04/06/2022 19:02   DG Chest 2 View  Result Date: 04/06/2022 CLINICAL DATA:  Weakness EXAM: CHEST - 2  VIEW COMPARISON:  03/16/2021 FINDINGS: Heart and mediastinal contours are within normal limits. No focal opacities or effusions. No acute bony abnormality. Scattered aortic calcifications. Moderate-sized hiatal hernia. IMPRESSION: No active cardiopulmonary disease. Electronically Signed   By: Rolm Baptise M.D.   On: 04/06/2022 17:27    Assessment and Plan: Pamela Reed is a 88 y.o. female with medical history significant for hypertension, hypothyroidism, anxiety, and recent ED visit where she was diagnosed with diverticulitis, who presents with worsening symptoms and inability to take p.o. as well as multiple falls.  * Diverticulitis Remains acutely ill and is declining due to this.  Has not taken any antibiotics in several days.  Very mild abdominal exam and patient reports that overall it is less painful than before but regardless we will treat. - Ciprofloxacin 400 mg IV every 12 hours - Flagyl 500 mg every 8 hours IV - No colonoscopies on file, while patient is well out of age of routine screening may consider based on clinical picture - LR to 125 cc an  hour continuous  Known medical problems Asthma-continue albuterol as needed, steroid/LABA inhaler Anxiety-continue Xanax as needed, daily fluoxetine Glaucoma-continue latanoprost Hypothyroidism-continue Synthroid Hypertension-continue lisinopril, metoprolol GERD-continue omeprazole  Physical deconditioning Significant decline in ADLs in setting of acute illness from diverticulitis with multiple falls. - PT/OT consult       Advance Care Planning:   Code Status: Full Code   Consults: PT, OT  Family Communication: husband updated at bedside  Severity of Illness: The appropriate patient status for this patient is INPATIENT. Inpatient status is judged to be reasonable and necessary in order to provide the required intensity of service to ensure the patient's safety. The patient's presenting symptoms, physical exam findings, and  initial radiographic and laboratory data in the context of their chronic comorbidities is felt to place them at high risk for further clinical deterioration. Furthermore, it is not anticipated that the patient will be medically stable for discharge from the hospital within 2 midnights of admission.   * I certify that at the point of admission it is my clinical judgment that the patient will require inpatient hospital care spanning beyond 2 midnights from the point of admission due to high intensity of service, high risk for further deterioration and high frequency of surveillance required.*  Author: Clarnce Flock, MD 04/11/2022 8:00 PM  For on call review [email or web address].

## 2022-04-11 NOTE — Assessment & Plan Note (Signed)
Asthma-continue albuterol as needed, steroid/LABA inhaler Anxiety-continue Xanax as needed, daily fluoxetine Glaucoma-continue latanoprost Hypothyroidism-continue Synthroid Hypertension-continue lisinopril, metoprolol GERD-continue omeprazole

## 2022-04-11 NOTE — ED Provider Notes (Signed)
[facility] Provider Note    Event Date/Time   First MD Initiated Contact with Patient 04/11/22 1232     (approximate)   History   Fall   HPI  Pamela Reed is a 88 y.o. female with a history of COPD, hypertension, hypothyroidism, pulmonary hypertension who presents with worsening generalized weakness and a fall after being diagnosed with a UTI and diverticulitis earlier this week.  The patient states that she has been able to take the antibiotics but continues to have some left-sided abdominal pain, and has nausea and generalized weakness.  She has not been able to get out of bed much or complete any ADLs.  However she denies any worsening abdominal pain or any fever, significant vomiting, blood in the stool, chest pain, or difficulty breathing.  When she fell she just slid out of her chair but did not hit her head or injure herself.    Physical Exam   Triage Vital Signs: ED Triage Vitals  Enc Vitals Group     BP 04/11/22 1212 (!) 191/90     Pulse Rate 04/11/22 1212 78     Resp 04/11/22 1212 20     Temp 04/11/22 1212 98.2 F (36.8 C)     Temp Source 04/11/22 1212 Oral     SpO2 04/11/22 1212 98 %     Weight 04/11/22 1213 170 lb (77.1 kg)     Height 04/11/22 1213 5\' 2"  (1.575 m)     Head Circumference --      Peak Flow --      Pain Score 04/11/22 1213 0     Pain Loc --      Pain Edu? --      Excl. in GC? --     Most recent vital signs: Vitals:   04/11/22 1430 04/11/22 1500  BP: (!) 171/73 (!) 186/78  Pulse: 80 82  Resp: (!) 23 15  Temp:    SpO2: 97% 100%     General: Alert and oriented, somewhat weak and frail appearing but in no acute distress. CV:  Good peripheral perfusion.  Normal heart sounds. Resp:  Normal effort.  Lungs CTA B.   Abd:  No distention. Mild left lower quadrant tenderness.  No peritoneal signs. Other:  No scleral icterus.  Dry mucous membranes.   ED Results / Procedures / Treatments   Labs (all labs ordered  are listed, but only abnormal results are displayed) Labs Reviewed  COMPREHENSIVE METABOLIC PANEL - Abnormal; Notable for the following components:      Result Value   Sodium 134 (*)    Potassium 2.8 (*)    Glucose, Bld 119 (*)    BUN 7 (*)    Calcium 8.3 (*)    Albumin 3.0 (*)    All other components within normal limits  CBC WITH DIFFERENTIAL/PLATELET - Abnormal; Notable for the following components:   WBC 12.4 (*)    RBC 3.85 (*)    Hemoglobin 11.0 (*)    HCT 34.5 (*)    Neutro Abs 9.0 (*)    Monocytes Absolute 1.2 (*)    All other components within normal limits  URINALYSIS, ROUTINE W REFLEX MICROSCOPIC - Abnormal; Notable for the following components:   Color, Urine YELLOW (*)    APPearance CLEAR (*)    Ketones, ur 5 (*)    All other components within normal limits  LACTIC ACID, PLASMA  TROPONIN I (HIGH SENSITIVITY)     EKG  ED ECG REPORT I, Dionne Bucy, the attending physician, personally viewed and interpreted this ECG.  Date: 04/11/2022 EKG Time: 1435 Rate: 79 Rhythm: normal sinus rhythm QRS Axis: normal Intervals: normal ST/T Wave abnormalities: normal Narrative Interpretation: no evidence of acute ischemia    RADIOLOGY    PROCEDURES:  Critical Care performed: No  Procedures   MEDICATIONS ORDERED IN ED: Medications  sodium chloride 0.9 % bolus 500 mL (0 mLs Intravenous Stopped 04/11/22 1611)  potassium chloride SA (KLOR-CON M) CR tablet 40 mEq (40 mEq Oral Given 04/11/22 1616)     IMPRESSION / MDM / ASSESSMENT AND PLAN / ED COURSE  I reviewed the triage vital signs and the nursing notes.  88 year old female with PMH as noted above presents with persistent generalized weakness and a fall after being diagnosed with and treated for UTI and diverticulitis.  I reviewed the past medical records.  The patient was seen in the ED on 5/15 with nausea, diarrhea, and fatigue.  Urinalysis showed findings compatible with UTI.  CT from that date,  which I independently viewed and interpreted showed inflammatory fat stranding around the distal sigmoid colon consistent with diverticulitis.  The patient was discharged with Cipro and Flagyl.  On exam currently she is weak and frail appearing but in no distress.  She is hypertensive with otherwise normal vital signs.  The abdomen is soft with mild left lower quadrant tenderness.  Physical exam is otherwise unremarkable for acute findings.  Differential diagnosis includes, but is not limited to, dehydration, electrolyte abnormality, other metabolic disturbance, AKI, continued symptoms related to the UTI and/or diverticulitis.  I do not suspect colonic perforation, abscess, or other acute complication of diverticulitis given the relatively benign abdominal exam.  We will obtain labs, give fluids, and reassess.  There is no indication for repeat CT at this time.  I anticipate the patient will need admission to the hospital.    ----------------------------------------- 4:24 PM on 04/11/2022 -----------------------------------------  Lab work-up is significant for mild hypokalemia and an improved WBC count when compared to 5 days ago.  Lactate is normal.  Urinalysis is now clear.  Overall work-up is reassuring.  However, given the patient's weakness and decompensation and her inability to take care of herself and risk for falls we will admit for further management.  I consulted Dr. Crissie Reese from the hospitalist service; based on her discussion he agrees to admit the patient.    FINAL CLINICAL IMPRESSION(S) / ED DIAGNOSES   Final diagnoses:  Urinary tract infection without hematuria, site unspecified  Generalized weakness     Rx / DC Orders   ED Discharge Orders     None        Note:  This document was prepared using Dragon voice recognition software and may include unintentional dictation errors.    Dionne Bucy, MD 04/11/22 [phone]

## 2022-04-11 NOTE — ED Triage Notes (Signed)
Pt BIB [facility] from home C/O generalized weakness X 2 weeks. Pt reports recently seen for UTI, diverticulitis, still getting treated for both. Pt has had multiple falls over last two weeks and is unable to care for herself at home.

## 2022-04-11 NOTE — Assessment & Plan Note (Addendum)
Continue to have nausea and appetite remains quite poor.  Abdominal pain improving.  Had 2 loose bowel movements since this morning.  She was not taking her antibiotics for the past few days at home due to persistent nausea and vomiting. - Continue ciprofloxacin 400 mg IV every 12 hours - Continue Flagyl 500 mg every 8 hours IV - Decrease LR to 100 mL/h-we will stop IV fluids once started taking adequate p.o. -Continue with supportive care

## 2022-04-11 NOTE — Assessment & Plan Note (Addendum)
Significant decline in ADLs in setting of acute illness from diverticulitis with multiple falls. - PT/OT consult

## 2022-04-12 DIAGNOSIS — K5792 Diverticulitis of intestine, part unspecified, without perforation or abscess without bleeding: Secondary | ICD-10-CM | POA: Diagnosis not present

## 2022-04-12 LAB — BASIC METABOLIC PANEL
Anion gap: 8 (ref 5–15)
BUN: 7 mg/dL — ABNORMAL LOW (ref 8–23)
CO2: 25 mmol/L (ref 22–32)
Calcium: 8.5 mg/dL — ABNORMAL LOW (ref 8.9–10.3)
Chloride: 106 mmol/L (ref 98–111)
Creatinine, Ser: 0.82 mg/dL (ref 0.44–1.00)
GFR, Estimated: >60 mL/min (ref >60)
Glucose, Bld: 108 mg/dL — ABNORMAL HIGH (ref 70–99)
Potassium: 3.4 mmol/L — ABNORMAL LOW (ref 3.5–5.1)
Sodium: 139 mmol/L (ref 135–145)

## 2022-04-12 LAB — URINE CULTURE: Culture: >=100000 — AB

## 2022-04-12 LAB — CBC
HCT: 34.3 % — ABNORMAL LOW (ref 36.0–46.0)
Hemoglobin: 10.9 g/dL — ABNORMAL LOW (ref 12.0–15.0)
MCH: 28.5 pg (ref 26.0–34.0)
MCHC: 31.8 g/dL (ref 30.0–36.0)
MCV: 89.6 fL (ref 80.0–100.0)
Platelets: 358 10*3/uL (ref 150–400)
RBC: 3.83 MIL/uL — ABNORMAL LOW (ref 3.87–5.11)
RDW: 14.5 % (ref 11.5–15.5)
WBC: 10.8 10*3/uL — ABNORMAL HIGH (ref 4.0–10.5)
nRBC: 0 % (ref 0.0–0.2)

## 2022-04-12 LAB — MAGNESIUM: Magnesium: 1.8 mg/dL (ref 1.7–2.4)

## 2022-04-12 MED ORDER — ADULT MULTIVITAMIN W/MINERALS CH
1.0000 | ORAL_TABLET | Freq: Every day | ORAL | Status: DC
  Administered 2022-04-13 – 2022-04-14 (×2): 1 via ORAL
  Filled 2022-04-12 (×2): qty 1

## 2022-04-12 MED ORDER — POTASSIUM CHLORIDE CRYS ER 20 MEQ PO TBCR
40.0000 meq | EXTENDED_RELEASE_TABLET | Freq: Once | ORAL | Status: AC
  Administered 2022-04-12: 40 meq via ORAL
  Filled 2022-04-12: qty 2

## 2022-04-12 MED ORDER — BOOST / RESOURCE BREEZE PO LIQD CUSTOM
1.0000 | Freq: Three times a day (TID) | ORAL | Status: DC
Start: 2022-04-12 — End: 2022-04-14
  Administered 2022-04-12 – 2022-04-14 (×6): 1 via ORAL

## 2022-04-12 MED ORDER — FLUOXETINE HCL 10 MG PO CAPS
10.0000 mg | ORAL_CAPSULE | Freq: Every day | ORAL | Status: DC
  Administered 2022-04-12 – 2022-04-14 (×3): 10 mg via ORAL
  Filled 2022-04-12 (×3): qty 1

## 2022-04-12 MED ORDER — HYDRALAZINE HCL 25 MG PO TABS
25.0000 mg | ORAL_TABLET | Freq: Three times a day (TID) | ORAL | Status: DC
  Administered 2022-04-12 – 2022-04-14 (×7): 25 mg via ORAL
  Filled 2022-04-12 (×7): qty 1

## 2022-04-12 NOTE — Progress Notes (Signed)
Spoke with daughter [place] via phone. Update given. Daughter requesting MD to call her. MD notified.   Fuller Mandril, RN

## 2022-04-12 NOTE — Assessment & Plan Note (Signed)
Most likely secondary to GI losses.  Potassium improved to 3.4 after repletion on admission.  Still borderline low at 3.4 -Check magnesium -Replete potassium -Continue to monitor

## 2022-04-12 NOTE — Progress Notes (Signed)
   04/11/22 2004  Assess: MEWS Score  Temp (!) 102.1 F (38.9 C) (Reported to RN Nurse)  BP (!) 192/76 (Reported to RN Nurse.)  Pulse Rate 94  Resp 16  SpO2 95 %  O2 Device Room Air  Assess: MEWS Score  MEWS Temp 2  MEWS Systolic 0  MEWS Pulse 0  MEWS RR 0  MEWS LOC 0  MEWS Score 2  MEWS Score Color Yellow  Assess: if the MEWS score is Yellow or Red  Were vital signs taken at a resting state? Yes  Focused Assessment No change from prior assessment  Does the patient meet 2 or more of the SIRS criteria? Yes  Does the patient have a confirmed or suspected source of infection? Yes  Provider and Rapid Response Notified? Yes  MEWS guidelines implemented *See Row Information* Yes  Treat  MEWS Interventions Administered prn meds/treatments  Take Vital Signs  Increase Vital Sign Frequency  Yellow: Q 2hr X 2 then Q 4hr X 2, if remains yellow, continue Q 4hrs  Escalate  MEWS: Escalate Yellow: discuss with charge nurse/RN and consider discussing with provider and RRT  Notify: Charge Nurse/RN  Name of Charge Nurse/RN Notified Raynelle Fanning RN  Date Charge Nurse/RN Notified 04/11/22  Time Charge Nurse/RN Notified 2005  Document  Patient Outcome Stabilized after interventions  Progress note created (see row info) Yes  Assess: SIRS CRITERIA  SIRS Temperature  1  SIRS Pulse 1  SIRS Respirations  0  SIRS WBC 0  SIRS Score Sum  2

## 2022-04-12 NOTE — Assessment & Plan Note (Signed)
Blood pressure remained elevated.  Patient was on maximum dose of lisinopril and metoprolol at home. -Add p.o. hydralazine 25 mg every 8 hourly. -Continue with lisinopril and metoprolol -Continue with as needed IV hydralazine for systolic persistently above 180

## 2022-04-12 NOTE — Progress Notes (Signed)
Initial Nutrition Assessment  DOCUMENTATION CODES:   Obesity unspecified  INTERVENTION:   -Boost Breeze po TID, each supplement provides 250 kcal and 9 grams of protein  -MVI with minerals daily -RD will follow for diet advancement and adjust supplement regimen as appropriate  NUTRITION DIAGNOSIS:   Increased nutrient needs related to acute illness as evidenced by estimated needs.  GOAL:   Patient will meet greater than or equal to 90% of their needs  MONITOR:   Supplement acceptance, Diet advancement, PO intake  REASON FOR ASSESSMENT:   Malnutrition Screening Tool    ASSESSMENT:      Pt admitted with diverticulitis.   Reviewed I/O's: +1.1 L x 24 hours  UOP: 800 ml x 24 hours  Pt unavailable at time of visit. Attempted to speak with pt via call to hospital room phone, however, unable to reach. RD unable to obtain further nutrition-related history or complete nutrition-focused physical exam at this time.    Pt currently on a clear liquid diet. No meal completions currently available to assess at this time.    Reviewed wt hx; wt has been stable over the past year.   Medications reviewed and include lactated ringers infusion @ 100 ml/hr and cirpro.   Labs reviewed: K: 3.4, CBGS: 154.   Diet Order:   Diet Order             Diet clear liquid Room service appropriate? Yes; Fluid consistency: Thin  Diet effective now                   EDUCATION NEEDS:   No education needs have been identified at this time  Skin:  Skin Assessment: Reviewed RN Assessment  Last BM:  04/12/22  Height:   Ht Readings from Last 1 Encounters:  04/11/22 5\' 2"  (1.575 m)    Weight:   Wt Readings from Last 1 Encounters:  04/11/22 77.1 kg    Ideal Body Weight:  50 kg  BMI:  Body mass index is 31.09 kg/m.  Estimated Nutritional Needs:   Kcal:  1650-1850  Protein:  85-100 grams  Fluid:  > 1.6 L    Loistine Chance, RD, LDN, [place] Registered Dietitian II Certified  Diabetes Care and Education Specialist Please refer to [facility] for RD and/or RD on-call/weekend/after hours pager

## 2022-04-12 NOTE — Progress Notes (Signed)
Patient not feeling well. BP has been running high and pain is complaining of nausea and headache. PRN medications given for N/V, BP and headache. Update given to Dr. Nelson Chimes.      04/12/22 0734  Vitals  Temp 98.7 F (37.1 C)  Temp Source Oral  BP (!) 188/90  BP Location Right Arm  BP Method Manual  Patient Position (if appropriate) Lying  Pulse Rate 89  Pulse Rate Source Dinamap  ECG Heart Rate 89  Resp 18  Level of Consciousness  Level of Consciousness Alert  MEWS COLOR  MEWS Score Color Green  Oxygen Therapy  SpO2 96 %  O2 Device Room Air  Pain Assessment  Pain Scale 0-10  Pain Score 8  Pain Type Acute pain  Pain Location Head  Pain Descriptors / Indicators Aching;Throbbing  Pain Frequency Occasional  Pain Onset Gradual  Patients Stated Pain Goal 0  Pain Intervention(s) Medication (See eMAR)  Multiple Pain Sites No  MEWS Score  MEWS Temp 0  MEWS Systolic 0  MEWS Pulse 0  MEWS RR 0  MEWS LOC 0  MEWS Score 0

## 2022-04-12 NOTE — Hospital Course (Addendum)
Taken from H&P.   Pamela Reed is a 88 y.o. female with medical history significant for hypertension, hypothyroidism, anxiety, and recent ED visit where she was diagnosed with diverticulitis, who presents with worsening symptoms and inability to take p.o.   Patient was seen 5 days prior on May 15, at that time she reported diarrhea, vomiting, and left lower quadrant abdominal pain.  Underwent a CT scan which showed acute uncomplicated diverticulitis.  She was able to take p.o. at that time and overall well-appearing, she was discharged home on p.o. antibiotics.   Today presents reporting she has fallen multiple times.  She reports that since her ED presentation she has felt progressively weaker and had trouble taking p.o.  She has tried to keep up with water intake.  She reports she has fallen twice, has not hit her head either time.  Has gotten so difficult that she cannot even make it to the bathroom on her own.  She was taking the antibiotic she was prescribed but has not taken them for about 3 days at this point.  Abdominal pain is still present but has lessened slightly since she was last seen.   In the ED initial vital signs notable for hypertension, but otherwise unremarkable.  CMP showed hypokalemia with potassium 2.8, otherwise unremarkable.  Lactic acid and troponin were normal.  CBC showed downtrending white count from 15-12, mild decrease in hemoglobin from 12-11, otherwise unremarkable.  EKG was obtained which was unremarkable with no ischemic changes and unchanged compared to prior.  Her potassium was repleted and I was consulted to admit her for further management due to worsening symptoms and inability to care for self at home. No colonoscopies on file, while patient is well out of age of routine screening may consider based on clinical picture.  5/21: Patient continued to have some nausea and appetite remains poor.  2 loose bowel movements since this morning, abdominal pain  improved. Leukocytosis continued to improve, hemoglobin stable.  Potassium improved but still borderline low at 3.4.  No repeat imaging during current hospitalization. Blood pressure remained elevated at 190/80. Hydralazine 25 mg every 8 hourly added with her home dose of lisinopril and metoprolol.  5/22: PT is recommending SNF.  Had 2 loose bowel movements since morning.  GI pathogen panel negative.  5/23: Patient continued to improve had just 1 loose bowel movement since morning.  Abdominal pain has been resolved.  Nausea and vomiting improved.  Able to tolerate some diet. Niece was present at bedside. Patient was found to have hypokalemia and hypomagnesemia which was repleted.  Apparently she was on magnesium supplement at home but stopped taking them, advised to restart.  She was sent on 3 more days of ciprofloxacin and Flagyl. Lasix can be held for next couple of days until diarrhea completely resolved.  She just had 1 bowel movement since this morning.  Diet should be advanced slowly.  She is being discharged to rehab for further management and to regain her strength back before returning home as advised by our physical therapist.  She will continue the rest of her home medications and follow-up with her providers.

## 2022-04-12 NOTE — Assessment & Plan Note (Signed)
-   Continue home Synthroid °

## 2022-04-12 NOTE — Progress Notes (Addendum)
Progress Note   Patient: Pamela Reed:[id number] DOB: 11/06/1931 DOA: 04/11/2022     1 DOS: the patient was seen and examined on 04/12/2022   Brief hospital course: Taken from H&P.   Pamela Reed is a 88 y.o. female with medical history significant for hypertension, hypothyroidism, anxiety, and recent ED visit where she was diagnosed with diverticulitis, who presents with worsening symptoms and inability to take p.o.   Patient was seen 5 days prior on May 15, at that time she reported diarrhea, vomiting, and left lower quadrant abdominal pain.  Underwent a CT scan which showed acute uncomplicated diverticulitis.  She was able to take p.o. at that time and overall well-appearing, she was discharged home on p.o. antibiotics.   Today presents reporting she has fallen multiple times.  She reports that since her ED presentation she has felt progressively weaker and had trouble taking p.o.  She has tried to keep up with water intake.  She reports she has fallen twice, has not hit her head either time.  Has gotten so difficult that she cannot even make it to the bathroom on her own.  She was taking the antibiotic she was prescribed but has not taken them for about 3 days at this point.  Abdominal pain is still present but has lessened slightly since she was last seen.   In the ED initial vital signs notable for hypertension, but otherwise unremarkable.  CMP showed hypokalemia with potassium 2.8, otherwise unremarkable.  Lactic acid and troponin were normal.  CBC showed downtrending white count from 15-12, mild decrease in hemoglobin from 12-11, otherwise unremarkable.  EKG was obtained which was unremarkable with no ischemic changes and unchanged compared to prior.  Her potassium was repleted and I was consulted to admit her for further management due to worsening symptoms and inability to care for self at home. No colonoscopies on file, while patient is well out of age of routine screening may  consider based on clinical picture.  5/21: Patient continued to have some nausea and appetite remains poor.  2 loose bowel movements since this morning, abdominal pain improved. Leukocytosis continued to improve, hemoglobin stable.  Potassium improved but still borderline low at 3.4.  No repeat imaging during current hospitalization. Blood pressure remained elevated at 190/80. Hydralazine 25 mg every 8 hourly added with her home dose of lisinopril and metoprolol.   Assessment and Plan: * Diverticulitis Continue to have nausea and appetite remains quite poor.  Abdominal pain improving.  Had 2 loose bowel movements since this morning.  She was not taking her antibiotics for the past few days at home due to persistent nausea and vomiting. - Continue ciprofloxacin 400 mg IV every 12 hours - Continue Flagyl 500 mg every 8 hours IV - Decrease LR to 100 mL/h-we will stop IV fluids once started taking adequate p.o. -Continue with supportive care  HTN (hypertension) Blood pressure remained elevated.  Patient was on maximum dose of lisinopril and metoprolol at home. -Add p.o. hydralazine 25 mg every 8 hourly. -Continue with lisinopril and metoprolol -Continue with as needed IV hydralazine for systolic persistently above 180  Hypokalemia Most likely secondary to GI losses.  Potassium improved to 3.4 after repletion on admission.  Still borderline low at 3.4 -Check magnesium -Replete potassium -Continue to monitor  Hypothyroidism - Continue home Synthroid  Anxiety - Continue home fluoxetine and as needed Xanax  Physical deconditioning Significant decline in ADLs in setting of acute illness from diverticulitis with multiple falls. -  PT/OT consult  Known medical problems Asthma-continue albuterol as needed, steroid/LABA inhaler Anxiety-continue Xanax as needed, daily fluoxetine Glaucoma-continue latanoprost Hypothyroidism-continue Synthroid Hypertension-continue lisinopril,  metoprolol GERD-continue omeprazole   Subjective: Patient was still feeling nauseated.  She does not want to eat anything.  Had 2 loose bowel movements since this morning.  Stating that belly pain has been resolved.  Physical Exam: Vitals:   04/12/22 0100 04/12/22 0425 04/12/22 0734 04/12/22 0844  BP: (!) 188/82 (!) 190/80 (!) 188/90 (!) 144/58  Pulse: 81 82 89   Resp: 18 18 18    Temp: 98.4 F (36.9 C) 99.3 F (37.4 C) 98.7 F (37.1 C) 99.5 F (37.5 C)  TempSrc:   Oral Oral  SpO2: 98% 98% 96%   Weight:      Height:       General.  Ill-appearing elderly lady, in no acute distress. Pulmonary.  Lungs clear bilaterally, normal respiratory effort. CV.  Regular rate and rhythm, no JVD, rub or murmur. Abdomen.  Soft, nontender, nondistended, BS positive. CNS.  Alert and oriented .  No focal neurologic deficit. Extremities.  No edema, no cyanosis, pulses intact and symmetrical. Psychiatry.  Judgment and insight appears normal.  Data Reviewed: Prior notes, labs and images reviewed  Family Communication: Discussed with daughter over the phone  Disposition: Status is: Inpatient Remains inpatient appropriate because: Severity of illness   Planned Discharge Destination: Home  DVT prophylaxis.  Lovenox Time spent: 50 minutes  This record has been created using . Errors have been sought and corrected,but may not always be located. Such creation errors do not reflect on the standard of care.  Author: Conservation officer, historic buildings, MD 04/12/2022 11:20 AM  For on call review [email or web address].

## 2022-04-12 NOTE — Evaluation (Signed)
Physical Therapy Evaluation Patient Details Name: Pamela Reed MRN: [id number] DOB: 06-10-1931 Today's Date: 04/12/2022  History of Present Illness  Pt is an 88 y/o F admitted on 04/11/22. Pt with a recent ED visit where she was diagnosed with diverticulitis who presents now with worsening symptoms & inability to take p.o. & pt now c/o falling multiple times. 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 PLOF/home set up information obtained from chart but pt does endorse falling a lot at home & her daughter providing quite a bit of supervision/assistance. On this date, pt requires mod assist for bed mobility with reliance on HOB elevated & use of bed rails. Pt demonstrates posterior lean while sitting EOB but progresses to sitting with supervision. Pt is able to complete STS with max assist with decreased ability to activate glutes/hip extensors to come to full upright posture & pt continues to be forward flexed with heavy BUE support on RW. Pt shuffles feet to R along EOB, not taking actual steps, and becomes more anxious with mobility. Pt assisted back to bed & agreeable to STR upon d/c. Will continue to follow pt acutely to address balance, endurance, strengthening, and transfers & gait with LRAD.       Recommendations for follow up therapy are one component of a multi-disciplinary discharge planning process, led by the attending physician.  Recommendations may be updated based on patient status, additional functional criteria and insurance authorization.  Follow Up Recommendations Skilled nursing-short term rehab (<3 hours/day)    Assistance Recommended at Discharge Frequent or constant Supervision/Assistance  Patient can return home with the following  A lot of help with walking and/or transfers;A lot of help with bathing/dressing/bathroom;Assist for transportation;Assistance with  cooking/housework;Help with stairs or ramp for entrance;Direct supervision/assist for financial management    Equipment Recommendations None recommended by PT  Recommendations for Other Services       Functional Status Assessment Patient has had a recent decline in their functional status and demonstrates the ability to make significant improvements in function in a reasonable and predictable amount of time.     Precautions / Restrictions Precautions Precautions: Fall Restrictions Weight Bearing Restrictions: No      Mobility  Bed Mobility Overal bed mobility: Needs Assistance Bed Mobility: Supine to Sit, Sit to Supine     Supine to sit: Mod assist Sit to supine: Mod assist, HOB elevated   General bed mobility comments: use of bed rails, assistance to move BLE to EOB & back onto bed, assistance to upright trunk; pt is able to scoot to [facility] with assistance to locate & use bed rails & bed in trendelenburg position    Transfers Overall transfer level: Needs assistance Equipment used: Rolling walker (2 wheels) Transfers: Sit to/from Stand Sit to Stand: Max assist           General transfer comment: education re: safe hand placement during STS with RW    Ambulation/Gait                  Stairs            Wheelchair Mobility    Modified Rankin (Stroke Patients Only)       Balance Overall balance assessment: Needs assistance, History of Falls Sitting-balance support: Feet supported, Bilateral upper extremity supported Sitting balance-Leahy Scale: Poor Sitting balance - Comments: min assist fade to close supervision with PT  providing cuing/facilitation to correct posterior lean in sitting; pt appears to lean posteriorly as she's fearful of falling Postural control: Posterior lean Standing balance support: During functional activity, Bilateral upper extremity supported, Reliant on assistive device for balance Standing balance-Leahy Scale: Zero Standing  balance comment: BUE support on RW & mod assist                             Pertinent Vitals/Pain Pain Assessment Pain Assessment: Faces Faces Pain Scale: Hurts little more Pain Location: chronic L knee & RLE pain Pain Descriptors / Indicators: Discomfort Pain Intervention(s): Monitored during session, Limited activity within patient's tolerance    Home Living Family/patient expects to be discharged to:: Private residence Living Arrangements: Alone Available Help at Discharge: Available 24 hours/day;Family (pt lives alone but daughter is retired & has been providing frequent supervision/assist) Type of Home: House Home Access: Stairs to enter Entrance Stairs-Rails: Right Entrance Stairs-Number of Steps: 1   Home Layout: One level Home Equipment: Rollator (4 wheels)      Prior Function               Mobility Comments: Pt reports she has been falling a lot sinc recent d/c home       Hand Dominance        Extremity/Trunk Assessment   Upper Extremity Assessment Upper Extremity Assessment: Generalized weakness    Lower Extremity Assessment Lower Extremity Assessment: Generalized weakness (3-/5 knee extension in sitting BLE)       Communication   Communication: HOH (per chart: deaf L ear, 90% loss in R ear but has hearing aide)  Cognition Arousal/Alertness: Awake/alert Behavior During Therapy: WFL for tasks assessed/performed (fearful of falling/mobility) Overall Cognitive Status: Within Functional Limits for tasks assessed                                 General Comments: AxOx4        General Comments      Exercises     Assessment/Plan    PT Assessment Patient needs continued PT services  PT Problem List Decreased strength;Decreased coordination;Cardiopulmonary status limiting activity;Decreased activity tolerance;Decreased balance;Decreased knowledge of use of DME;Decreased mobility       PT Treatment Interventions DME  instruction;Therapeutic exercise;Gait training;Balance training;Stair training;Neuromuscular re-education;Functional mobility training;Therapeutic activities;Patient/family education;Modalities    PT Goals (Current goals can be found in the Care Plan section)  Acute Rehab PT Goals Patient Stated Goal: get better PT Goal Formulation: With patient Time For Goal Achievement: 04/26/22 Potential to Achieve Goals: Fair    Frequency Min 2X/week     Co-evaluation               AM-PAC PT "6 Clicks" Mobility  Outcome Measure Help needed turning from your back to your side while in a flat bed without using bedrails?: A Little Help needed moving from lying on your back to sitting on the side of a flat bed without using bedrails?: Total Help needed moving to and from a bed to a chair (including a wheelchair)?: Total Help needed standing up from a chair using your arms (e.g., wheelchair or bedside chair)?: Total Help needed to walk in hospital room?: Total Help needed climbing 3-5 steps with a railing? : Total 6 Click Score: 8    End of Session Equipment Utilized During Treatment: Gait belt Activity Tolerance: Patient limited by fatigue Patient left: in  bed;with call bell/phone within reach;with bed alarm set Nurse Communication: Mobility status PT Visit Diagnosis: Unsteadiness on feet (R26.81);Difficulty in walking, not elsewhere classified (R26.2);Muscle weakness (generalized) (M62.81);History of falling (Z91.81)    Time: 1441-1453 PT Time Calculation (min) (ACUTE ONLY): 12 min   Charges:   PT Evaluation $PT Eval Moderate Complexity: Pamela Reed, PT, DPT 04/12/22, 3:04 PM   Waunita Schooner 04/12/2022, 3:02 PM

## 2022-04-12 NOTE — Assessment & Plan Note (Signed)
Continue home fluoxetine and as needed Xanax ?

## 2022-04-12 NOTE — Progress Notes (Signed)
PT Cancellation Note  Patient Details Name: Pamela Reed MRN: [id number] DOB: 1931-09-07   Cancelled Treatment:    Reason Eval/Treat Not Completed: Other (comment) PT orders received, chart reviewed. Per OT, pt with N&V & declining evaluation at this time. Will f/u as able & as pt is agreeable.  Aleda Grana, PT, DPT 04/12/22, 9:24 AM   Sandi Mariscal 04/12/2022, 9:24 AM

## 2022-04-12 NOTE — Progress Notes (Signed)
OT Cancellation Note  Patient Details Name: Pamela Reed MRN: [id number] DOB: 11-Jan-1931   Cancelled Treatment:    Reason Eval/Treat Not Completed: Medical issues which prohibited therapy. Order received and chart reviewed. Upon arrival pt reports nausea and vomiting this AM, noted to have been given anti nausea medication at 8am. Pt refuses evaluation at this time, will re-attempt as pt appropriate.   Kathie Dike, M.S. OTR/L  04/12/22, 9:04 AM  ascom [phone]

## 2022-04-12 NOTE — Plan of Care (Signed)

## 2022-04-13 DIAGNOSIS — K5792 Diverticulitis of intestine, part unspecified, without perforation or abscess without bleeding: Secondary | ICD-10-CM | POA: Diagnosis not present

## 2022-04-13 LAB — GASTROINTESTINAL PANEL BY PCR, STOOL (REPLACES STOOL CULTURE)

## 2022-04-13 LAB — BASIC METABOLIC PANEL
Anion gap: 9 (ref 5–15)
BUN: 7 mg/dL — ABNORMAL LOW (ref 8–23)
CO2: 23 mmol/L (ref 22–32)
Calcium: 8.2 mg/dL — ABNORMAL LOW (ref 8.9–10.3)
Chloride: 102 mmol/L (ref 98–111)
Creatinine, Ser: 0.8 mg/dL (ref 0.44–1.00)
GFR, Estimated: >60 mL/min (ref >60)
Glucose, Bld: 114 mg/dL — ABNORMAL HIGH (ref 70–99)
Potassium: 3.2 mmol/L — ABNORMAL LOW (ref 3.5–5.1)
Sodium: 134 mmol/L — ABNORMAL LOW (ref 135–145)

## 2022-04-13 MED ORDER — DIPHENHYDRAMINE HCL 50 MG/ML IJ SOLN
25.0000 mg | Freq: Once | INTRAMUSCULAR | Status: DC | PRN
Start: 2022-04-13 — End: 2022-04-14

## 2022-04-13 MED ORDER — POTASSIUM CHLORIDE CRYS ER 20 MEQ PO TBCR
40.0000 meq | EXTENDED_RELEASE_TABLET | Freq: Once | ORAL | Status: AC
  Administered 2022-04-13: 40 meq via ORAL
  Filled 2022-04-13: qty 2

## 2022-04-13 MED ORDER — MAGNESIUM SULFATE 2 GM/50ML IV SOLN
2.0000 g | Freq: Once | INTRAVENOUS | Status: AC
  Administered 2022-04-13: 2 g via INTRAVENOUS
  Filled 2022-04-13: qty 50

## 2022-04-13 MED ORDER — KETOROLAC TROMETHAMINE 15 MG/ML IJ SOLN
15.0000 mg | Freq: Once | INTRAMUSCULAR | Status: AC
  Administered 2022-04-13: 15 mg via INTRAVENOUS
  Filled 2022-04-13: qty 1

## 2022-04-13 NOTE — Evaluation (Signed)
Occupational Therapy Evaluation Patient Details Name: Pamela Reed MRN: [id number] DOB: 1931/08/12 Today's Date: 04/13/2022   History of Present Illness Pt is an 88 y/o F admitted on 04/11/22. Pt with a recent ED visit where she was diagnosed with diverticulitis who presents now with worsening symptoms & inability to take p.o. & pt now c/o falling multiple times. 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 Prior to hospital admission, pt was requiring assist at home fir ADLs, MOD I limited mobility. Pt lives alone with daughter staying for supervision. Pt presents to acute OT demonstrating impaired ADL performance and functional mobility 2/2 decreased activity tolerance and functional strength/ROM/balance deficits. Pt currently requires MAX A x2 bed mobility. Initial MIN A decreasing to MOD A dynamic sitting balance as pt fatigues. MIN A grooming seated EOB - assist for L lateral lean and posterior lean. Pt reports nausea and abdominal pain and self-initiates return to bed. Daughter and MD in at end of session. Pt would benefit from skilled OT to address noted impairments and functional limitations (see below for any additional details). Upon hospital discharge, recommend STR to maximize pt safety and return to PLOF.   Recommendations for follow up therapy are one component of a multi-disciplinary discharge planning process, led by the attending physician.  Recommendations may be updated based on patient status, additional functional criteria and insurance authorization.   Follow Up Recommendations  Skilled nursing-short term rehab (<3 hours/day)    Assistance Recommended at Discharge Frequent or constant Supervision/Assistance  Patient can return home with the following Two people to help with walking and/or transfers;Two people to help with  bathing/dressing/bathroom    Functional Status Assessment  Patient has had a recent decline in their functional status and demonstrates the ability to make significant improvements in function in a reasonable and predictable amount of time.  Equipment Recommendations  BSC/3in1;Hospital bed    Recommendations for Other Services       Precautions / Restrictions Precautions Precautions: Fall Restrictions Weight Bearing Restrictions: No      Mobility Bed Mobility Overal bed mobility: Needs Assistance Bed Mobility: Supine to Sit, Sit to Supine     Supine to sit: Max assist Sit to supine: Max assist, +2 for physical assistance        Transfers Overall transfer level: Needs assistance   Transfers: Bed to chair/wheelchair/BSC            Lateral/Scoot Transfers: Total assist General transfer comment: TOTAL A along EOB      Balance Overall balance assessment: Needs assistance, History of Falls Sitting-balance support: Feet supported, Bilateral upper extremity supported Sitting balance-Leahy Scale: Poor   Postural control: Left lateral lean, Posterior lean                                 ADL either performed or assessed with clinical judgement   ADL Overall ADL's : Needs assistance/impaired                                       General ADL Comments: MIN A grooming seated EOB - assist for L lateral lean and posterior lean with static and dynamic balance. MAX A don B socks at bed level  Pertinent Vitals/Pain Pain Assessment Pain Assessment: Faces Faces Pain Scale: Hurts little more Pain Location: abdomen Pain Descriptors / Indicators: Discomfort Pain Intervention(s): Limited activity within patient's tolerance, Repositioned     Hand Dominance Right   Extremity/Trunk Assessment Upper Extremity Assessment Upper Extremity Assessment: Generalized weakness   Lower Extremity Assessment Lower Extremity Assessment: Generalized  weakness       Communication Communication Communication: HOH   Cognition Arousal/Alertness: Awake/alert Behavior During Therapy: Anxious Overall Cognitive Status: Impaired/Different from baseline                                 General Comments: poor command following and insight                Home Living Family/patient expects to be discharged to:: Private residence Living Arrangements: Alone Available Help at Discharge: Available 24 hours/day;Family (dtr providing assist) Type of Home: House Home Access: Stairs to enter Secretary/administrator of Steps: 1 Entrance Stairs-Rails: Right Home Layout: One level               Home Equipment: Rollator (4 wheels)          Prior Functioning/Environment Prior Level of Function : Needs assist;History of Falls (last six months)             Mobility Comments: Pt reports she has been falling a lot since recent d/c home          OT Problem List: Decreased strength;Decreased range of motion;Decreased activity tolerance;Impaired balance (sitting and/or standing);Impaired vision/perception;Decreased coordination;Decreased knowledge of use of DME or AE;Decreased safety awareness      OT Treatment/Interventions: Self-care/ADL training;Therapeutic exercise;Energy conservation;DME and/or AE instruction;Therapeutic activities;Patient/family education;Balance training    OT Goals(Current goals can be found in the care plan section) Acute Rehab OT Goals Patient Stated Goal: to improve nausea OT Goal Formulation: With patient/family Time For Goal Achievement: 04/27/22 Potential to Achieve Goals: Fair ADL Goals Pt Will Perform Grooming: with set-up;with supervision;sitting Pt Will Perform Lower Body Dressing: with min assist;sitting/lateral leans Pt Will Transfer to Toilet: with max assist;squat pivot transfer;bedside commode  OT Frequency: Min 2X/week    Co-evaluation              AM-PAC OT "6  Clicks" Daily Activity     Outcome Measure Help from another person eating meals?: A Little Help from another person taking care of personal grooming?: A Lot Help from another person toileting, which includes using toliet, bedpan, or urinal?: A Lot Help from another person bathing (including washing, rinsing, drying)?: A Lot Help from another person to put on and taking off regular upper body clothing?: A Lot Help from another person to put on and taking off regular lower body clothing?: A Lot 6 Click Score: 13   End of Session    Activity Tolerance: Other (comment);Patient limited by fatigue (nausea) Patient left: in bed;with bed alarm set;with family/visitor present  OT Visit Diagnosis: Other abnormalities of gait and mobility (R26.89);Repeated falls (R29.6)                Time: 8466-5993 OT Time Calculation (min): 21 min Charges:  OT General Charges $OT Visit: 1 Visit OT Evaluation $OT Eval Moderate Complexity: 1 Mod OT Treatments $Self Care/Home Management : 8-22 mins  Kathie Dike, M.S. OTR/L  04/13/22, 1:20 PM  ascom [phone]

## 2022-04-13 NOTE — NC FL2 (Signed)
[place] LEVEL OF CARE SCREENING TOOL     IDENTIFICATION  Patient Name: Pamela Reed Birthdate: October 03, 1931 Sex: female Admission Date (Current Location): 04/11/2022  [facility] and [place] Number:  Engineering geologist and Address:         Provider Number: [id number]  Attending Physician Name and Address:  Lorella Nimrod, MD  Relative Name and Phone Number:       Current Level of Care: Hospital Recommended Level of Care: [place] Prior Approval Number:    Date Approved/Denied:   PASRR Number: [id number]  Discharge Plan: SNF    Current Diagnoses: Patient Active Problem List   Diagnosis Date Noted   Diverticulitis 04/11/2022   Physical deconditioning 04/11/2022   Known medical problems 04/11/2022   Hyponatremia 03/17/2021   Hypokalemia 03/17/2021   Dysphagia 03/17/2021   HTN (hypertension) 03/17/2021   Hypothyroidism 03/17/2021   Anxiety 03/17/2021   Depression 03/17/2021   Glaucoma 03/17/2021   RLS (restless legs syndrome) 01/01/2017   B12 deficiency 11/18/2016   Chronic cystitis 12/17/2012   Mixed urge and stress incontinence 12/17/2012    Orientation RESPIRATION BLADDER Height & Weight     Self, Time, Situation, Place  Normal External catheter, Incontinent Weight: 77.1 kg Height:  5\' 2"  (157.5 cm)  BEHAVIORAL SYMPTOMS/MOOD NEUROLOGICAL BOWEL NUTRITION STATUS      Continent Diet (Clears.  To be advanced prior to discharge)  AMBULATORY STATUS COMMUNICATION OF NEEDS Skin   Extensive Assist Verbally Bruising                       Personal Care Assistance Level of Assistance              Functional Limitations Info  Hearing, Sight Sight Info: Impaired Hearing Info: Impaired      SPECIAL CARE FACTORS FREQUENCY  PT (By licensed PT), OT (By licensed OT)                    Contractures Contractures Info: Not present    Additional Factors Info  Code Status, Allergies Code Status Info: Full Allergies  Info: Contrast Media (Iodinated Contrast Media), Iodine, Sulfasalazine, Dye Fdc Red (Red Dye), Lactose Intolerance (Gi), Penicillins, Shellfish Allergy, Sulfa Antibiotics, Venlafaxine, Codeine, Ropinirole           Current Medications (04/13/2022):  This is the current hospital active medication list Current Facility-Administered Medications  Medication Dose Route Frequency Provider Last Rate Last Admin   acetaminophen (TYLENOL) tablet 650 mg  650 mg Oral Q6H PRN Clarnce Flock, MD   650 mg at 04/12/22 [id number]   Or   acetaminophen (TYLENOL) suppository 650 mg  650 mg Rectal Q6H PRN Clarnce Flock, MD       albuterol (PROVENTIL) (2.5 MG/3ML) 0.083% nebulizer solution 2.5 mg  2.5 mg Nebulization Q6H PRN Clarnce Flock, MD       ALPRAZolam Duanne Moron) tablet 0.25 mg  0.25 mg Oral BID PRN Clarnce Flock, MD       ciprofloxacin (CIPRO) IVPB 400 mg  400 mg Intravenous Q12H Clarnce Flock, MD 200 mL/hr at 04/13/22 0838 400 mg at 04/13/22 0838   enoxaparin (LOVENOX) injection 40 mg  40 mg Subcutaneous Q24H Clarnce Flock, MD   40 mg at 04/12/22 2214   feeding supplement (BOOST / RESOURCE BREEZE) liquid 1 Container  1 Container Oral TID BM Lorella Nimrod, MD   1 Container at 04/13/22 0841   FLUoxetine (PROZAC)  capsule 10 mg  10 mg Oral Daily Renda Rolls, RPH   10 mg at 04/13/22 0840   fluticasone furoate-vilanterol (BREO ELLIPTA) 100-25 MCG/ACT 1 puff  1 puff Inhalation Daily Clarnce Flock, MD   1 puff at 04/13/22 0841   hydrALAZINE (APRESOLINE) injection 10 mg  10 mg Intravenous Q6H PRN Sharion Settler, NP   10 mg at 04/12/22 [id number]   hydrALAZINE (APRESOLINE) tablet 25 mg  25 mg Oral Q8H Lorella Nimrod, MD   25 mg at 04/13/22 0516   lactated ringers infusion   Intravenous Continuous Lorella Nimrod, MD 100 mL/hr at 04/12/22 1545 New Bag at 04/12/22 1545   Latanoprostene Bunod 0.024 % SOLN 1 drop  1 drop Both Eyes QHS Lorella Nimrod, MD   1 drop at 04/12/22 2214   levothyroxine  (SYNTHROID) tablet 50 mcg  50 mcg Oral QAC breakfast Clarnce Flock, MD   50 mcg at 04/13/22 0516   lisinopril (ZESTRIL) tablet 20 mg  20 mg Oral BID Clarnce Flock, MD   20 mg at 04/13/22 0840   metoprolol succinate (TOPROL-XL) 24 hr tablet 25 mg  25 mg Oral BID Clarnce Flock, MD   25 mg at 04/13/22 0840   metroNIDAZOLE (FLAGYL) IVPB 500 mg  500 mg Intravenous Q8H Clarnce Flock, MD 100 mL/hr at 04/13/22 0516 500 mg at 04/13/22 0516   multivitamin with minerals tablet 1 tablet  1 tablet Oral Daily Lorella Nimrod, MD   1 tablet at 04/13/22 0840   Netarsudil Dimesylate 0.02 % SOLN 1 drop  1 drop Both Eyes QHS Clarnce Flock, MD   1 drop at 04/12/22 2213   omeprazole (PRILOSEC) capsule 20 mg  20 mg Oral Daily PRN Clarnce Flock, MD       ondansetron [facility]) tablet 4 mg  4 mg Oral Q6H PRN Clarnce Flock, MD       Or   ondansetron [facility]) injection 4 mg  4 mg Intravenous Q6H PRN Clarnce Flock, MD   4 mg at 04/12/22 [id number]   oxyCODONE (Oxy IR/ROXICODONE) immediate release tablet 5 mg  5 mg Oral Q4H PRN Clarnce Flock, MD       polyethylene glycol (MIRALAX / GLYCOLAX) packet 17 g  17 g Oral Daily PRN Clarnce Flock, MD       traZODone (DESYREL) tablet 50 mg  50 mg Oral QHS PRN Clarnce Flock, MD   50 mg at 04/12/22 2233     Discharge Medications: Please see discharge summary for a list of discharge medications.  Relevant Imaging Results:  Relevant Lab Results:   Additional Information ss [id number]  Beverly Sessions, RN

## 2022-04-13 NOTE — Assessment & Plan Note (Signed)
Most likely secondary to GI losses.  Potassium at 3.2, magnesium at 1.8 -Replete potassium and magnesium -Continue to monitor

## 2022-04-13 NOTE — TOC Initial Note (Signed)
Transition of Care [facility]) - Initial/Assessment Note    Patient Details  Name: Pamela Reed MRN: [id number] Date of Birth: 1930/12/25  Transition of [facility]) CM/SW Contact:    Beverly Sessions, RN Phone Number: 04/13/2022, 1:48 PM  Clinical Narrative:                   Admitted for: Diverticulitis Admitted from:Home alone.  Local family support [id number] - family and friends transport to appointments Current home health/prior home health/DME: rollator  Therapy recommending SNF.  Patient agreeable.  [facility] choice is [facility] PASRR obtained Fl2 sent for signature Bed search initiated   Expected Discharge Plan: [place]     Patient Goals and CMS Choice        Expected Discharge Plan and Services Expected Discharge Plan: [place] arrangements for the past 2 months: Single Family Home                                      Prior Living Arrangements/Services Living arrangements for the past 2 months: Single Family Home Lives with:: Self Patient language and need for interpreter reviewed:: Yes Do you feel safe going back to the place where you live?: Yes      Need for Family Participation in Patient Care: Yes (Comment) Care giver support system in place?: Yes (comment) Current home services: DME Criminal Activity/Legal Involvement Pertinent to Current Situation/Hospitalization: No - Comment as needed  Activities of Daily Living Home Assistive Devices/Equipment: Walker (specify type) ADL Screening (condition at time of admission) Patient's cognitive ability adequate to safely complete daily activities?: Yes Is the patient deaf or have difficulty hearing?: No Does the patient have difficulty seeing, even when wearing glasses/contacts?: No Does the patient have difficulty concentrating, remembering, or making decisions?: No Patient able to express need for assistance with ADLs?: Yes Does the patient have  difficulty dressing or bathing?: No Independently performs ADLs?: Yes (appropriate for developmental age) Does the patient have difficulty walking or climbing stairs?: No Weakness of Legs: Both Weakness of Arms/Hands: None  Permission Sought/Granted                  Emotional Assessment       Orientation: : Oriented to Self, Oriented to  Time, Oriented to Place, Oriented to Situation Alcohol / Substance Use: Not Applicable Psych Involvement: No (comment)  Admission diagnosis:  Diverticulitis [K57.92] Generalized weakness [R53.1] Urinary tract infection without hematuria, site unspecified [N39.0] Patient Active Problem List   Diagnosis Date Noted   Diverticulitis 04/11/2022   Physical deconditioning 04/11/2022   Known medical problems 04/11/2022   Hyponatremia 03/17/2021   Hypokalemia 03/17/2021   Dysphagia 03/17/2021   HTN (hypertension) 03/17/2021   Hypothyroidism 03/17/2021   Anxiety 03/17/2021   Depression 03/17/2021   Glaucoma 03/17/2021   RLS (restless legs syndrome) 01/01/2017   B12 deficiency 11/18/2016   Chronic cystitis 12/17/2012   Mixed urge and stress incontinence 12/17/2012   PCP:  Lenard Simmer, MD Pharmacy:   [facility] [id number] [address], [place] [address] [place] [address] Phone: [phone] Fax: [phone]     Social Determinants of Health (SDOH) Interventions    Readmission Risk Interventions     View : No data to display.

## 2022-04-13 NOTE — Progress Notes (Signed)
Progress Note   Patient: Pamela Reed TOI:[id number] DOB: May 19, 1931 DOA: 04/11/2022     2 DOS: the patient was seen and examined on 04/13/2022   Brief hospital course: Taken from H&P.   Pamela Reed is a 88 y.o. female with medical history significant for hypertension, hypothyroidism, anxiety, and recent ED visit where she was diagnosed with diverticulitis, who presents with worsening symptoms and inability to take p.o.   Patient was seen 5 days prior on May 15, at that time she reported diarrhea, vomiting, and left lower quadrant abdominal pain.  Underwent a CT scan which showed acute uncomplicated diverticulitis.  She was able to take p.o. at that time and overall well-appearing, she was discharged home on p.o. antibiotics.   Today presents reporting she has fallen multiple times.  She reports that since her ED presentation she has felt progressively weaker and had trouble taking p.o.  She has tried to keep up with water intake.  She reports she has fallen twice, has not hit her head either time.  Has gotten so difficult that she cannot even make it to the bathroom on her own.  She was taking the antibiotic she was prescribed but has not taken them for about 3 days at this point.  Abdominal pain is still present but has lessened slightly since she was last seen.   In the ED initial vital signs notable for hypertension, but otherwise unremarkable.  CMP showed hypokalemia with potassium 2.8, otherwise unremarkable.  Lactic acid and troponin were normal.  CBC showed downtrending white count from 15-12, mild decrease in hemoglobin from 12-11, otherwise unremarkable.  EKG was obtained which was unremarkable with no ischemic changes and unchanged compared to prior.  Her potassium was repleted and I was consulted to admit her for further management due to worsening symptoms and inability to care for self at home. No colonoscopies on file, while patient is well out of age of routine screening may  consider based on clinical picture.  5/21: Patient continued to have some nausea and appetite remains poor.  2 loose bowel movements since this morning, abdominal pain improved. Leukocytosis continued to improve, hemoglobin stable.  Potassium improved but still borderline low at 3.4.  No repeat imaging during current hospitalization. Blood pressure remained elevated at 190/80. Hydralazine 25 mg every 8 hourly added with her home dose of lisinopril and metoprolol.  5/22: PT is recommending SNF.  Had 2 loose bowel movements since morning.  GI pathogen panel negative.   Assessment and Plan: * Diverticulitis Continue to have nausea and appetite remains quite poor.  Abdominal pain improving.  Had 2 loose bowel movements since this morning.  She was not taking her antibiotics for the past few days at home due to persistent nausea and vomiting.  Diet advanced to full liquid GI pathogen panel negative. - Continue ciprofloxacin 400 mg IV every 12 hours - Continue Flagyl 500 mg every 8 hours IV -Continue with supportive care  HTN (hypertension) Blood pressure remained elevated.  Patient was on maximum dose of lisinopril and metoprolol at home. -Add p.o. hydralazine 25 mg every 8 hourly. -Continue with lisinopril and metoprolol -Continue with as needed IV hydralazine for systolic persistently above 180  Hypokalemia Most likely secondary to GI losses.  Potassium at 3.2, magnesium at 1.8 -Replete potassium and magnesium -Continue to monitor  Hypothyroidism - Continue home Synthroid  Anxiety - Continue home fluoxetine and as needed Xanax  Physical deconditioning Significant decline in ADLs in setting of acute  illness from diverticulitis with multiple falls. - PT/OT consult-recommending SNF  Known medical problems Asthma-continue albuterol as needed, steroid/LABA inhaler Anxiety-continue Xanax as needed, daily fluoxetine Glaucoma-continue latanoprost Hypothyroidism-continue  Synthroid Hypertension-continue lisinopril, metoprolol GERD-continue omeprazole     Subjective: Patient was seen and examined today.  Daughter at bedside.  Feeling little improved.  Physical Exam: Vitals:   04/12/22 1613 04/12/22 1956 04/13/22 0355 04/13/22 1528  BP: (!) 188/76 (!) 169/62 (!) 155/60 (!) 151/61  Pulse: 89 93 87 96  Resp: 18 17 18 18   Temp: 98.7 F (37.1 C) 99 F (37.2 C) 98.8 F (37.1 C) 100 F (37.8 C)  TempSrc: Oral Oral Oral Oral  SpO2: 97% 93% 94% 94%  Weight:      Height:       General.     In no acute distress. Pulmonary.  Lungs clear bilaterally, normal respiratory effort. CV.  Regular rate and rhythm, no JVD, rub or murmur. Abdomen.  Soft, nontender, nondistended, BS positive. CNS.  Alert and oriented .  No focal neurologic deficit. Extremities.  No edema, no cyanosis, pulses intact and symmetrical. Psychiatry.  Judgment and insight appears normal.  Data Reviewed: Prior notes and labs reviewed  Family Communication: Discussed with second daughter at bedside  Disposition: Status is: Inpatient Remains inpatient appropriate because: Severity of illness   Planned Discharge Destination: Skilled nursing facility  DVT prophylaxis.  Lovenox Time spent: 40 minutes  This record has been created using . Errors have been sought and corrected,but may not always be located. Such creation errors do not reflect on the standard of care.  Author: Conservation officer, historic buildings, MD 04/13/2022 4:25 PM  For on call review [email or web address].

## 2022-04-13 NOTE — Assessment & Plan Note (Addendum)
Continue to have nausea and appetite remains quite poor.  Abdominal pain improving.  Had 2 loose bowel movements since this morning.  She was not taking her antibiotics for the past few days at home due to persistent nausea and vomiting.  Diet advanced to full liquid GI pathogen panel negative. - Continue ciprofloxacin 400 mg IV every 12 hours - Continue Flagyl 500 mg every 8 hours IV -Continue with supportive care

## 2022-04-13 NOTE — Plan of Care (Signed)

## 2022-04-13 NOTE — TOC Progression Note (Signed)
Transition of Care [facility]) - Progression Note    Patient Details  Name: ALLIZON WOZNICK MRN: [id number] Date of Birth: [birth year]  [facility]) CM/SW Contact  Chapman Fitch, RN Phone Number: 04/13/2022, 3:08 PM  Clinical Narrative:     Per patient request spoke with daughter vickie about bed offers.  [facility].  [facility] notified and she has started [facility]  Expected Discharge Plan: Skilled Nursing Facility    Expected Discharge Plan and Services Expected Discharge Plan: Skilled Nursing Facility       Living arrangements for the past 2 months: Single Family Home                                       Social Determinants of Health (SDOH) Interventions    Readmission Risk Interventions     View : No data to display.

## 2022-04-13 NOTE — Assessment & Plan Note (Signed)
Significant decline in ADLs in setting of acute illness from diverticulitis with multiple falls. - PT/OT consult-recommending SNF

## 2022-04-14 DIAGNOSIS — Z741 Need for assistance with personal care: Secondary | ICD-10-CM | POA: Diagnosis not present

## 2022-04-14 DIAGNOSIS — M6281 Muscle weakness (generalized): Secondary | ICD-10-CM | POA: Diagnosis not present

## 2022-04-14 DIAGNOSIS — J45998 Other asthma: Secondary | ICD-10-CM | POA: Diagnosis not present

## 2022-04-14 DIAGNOSIS — R0902 Hypoxemia: Secondary | ICD-10-CM | POA: Diagnosis not present

## 2022-04-14 DIAGNOSIS — Z7401 Bed confinement status: Secondary | ICD-10-CM | POA: Diagnosis not present

## 2022-04-14 DIAGNOSIS — F419 Anxiety disorder, unspecified: Secondary | ICD-10-CM | POA: Diagnosis not present

## 2022-04-14 DIAGNOSIS — R2681 Unsteadiness on feet: Secondary | ICD-10-CM | POA: Diagnosis not present

## 2022-04-14 DIAGNOSIS — R41841 Cognitive communication deficit: Secondary | ICD-10-CM | POA: Diagnosis not present

## 2022-04-14 DIAGNOSIS — E876 Hypokalemia: Secondary | ICD-10-CM | POA: Diagnosis not present

## 2022-04-14 DIAGNOSIS — R197 Diarrhea, unspecified: Secondary | ICD-10-CM | POA: Diagnosis not present

## 2022-04-14 DIAGNOSIS — R1312 Dysphagia, oropharyngeal phase: Secondary | ICD-10-CM | POA: Diagnosis not present

## 2022-04-14 DIAGNOSIS — R5383 Other fatigue: Secondary | ICD-10-CM | POA: Diagnosis not present

## 2022-04-14 DIAGNOSIS — R509 Fever, unspecified: Secondary | ICD-10-CM | POA: Diagnosis not present

## 2022-04-14 DIAGNOSIS — K5780 Diverticulitis of intestine, part unspecified, with perforation and abscess without bleeding: Secondary | ICD-10-CM | POA: Diagnosis not present

## 2022-04-14 DIAGNOSIS — E569 Vitamin deficiency, unspecified: Secondary | ICD-10-CM | POA: Diagnosis not present

## 2022-04-14 DIAGNOSIS — M6259 Muscle wasting and atrophy, not elsewhere classified, multiple sites: Secondary | ICD-10-CM | POA: Diagnosis not present

## 2022-04-14 DIAGNOSIS — F32A Depression, unspecified: Secondary | ICD-10-CM | POA: Diagnosis not present

## 2022-04-14 DIAGNOSIS — I10 Essential (primary) hypertension: Secondary | ICD-10-CM | POA: Diagnosis not present

## 2022-04-14 DIAGNOSIS — K5792 Diverticulitis of intestine, part unspecified, without perforation or abscess without bleeding: Secondary | ICD-10-CM | POA: Diagnosis not present

## 2022-04-14 DIAGNOSIS — R531 Weakness: Secondary | ICD-10-CM | POA: Diagnosis not present

## 2022-04-14 DIAGNOSIS — E785 Hyperlipidemia, unspecified: Secondary | ICD-10-CM | POA: Diagnosis not present

## 2022-04-14 DIAGNOSIS — E039 Hypothyroidism, unspecified: Secondary | ICD-10-CM | POA: Diagnosis not present

## 2022-04-14 DIAGNOSIS — R262 Difficulty in walking, not elsewhere classified: Secondary | ICD-10-CM | POA: Diagnosis not present

## 2022-04-14 LAB — BASIC METABOLIC PANEL
Anion gap: 8 (ref 5–15)
BUN: 10 mg/dL (ref 8–23)
CO2: 23 mmol/L (ref 22–32)
Calcium: 7.9 mg/dL — ABNORMAL LOW (ref 8.9–10.3)
Chloride: 100 mmol/L (ref 98–111)
Creatinine, Ser: 0.91 mg/dL (ref 0.44–1.00)
GFR, Estimated: >60 mL/min (ref >60)
Glucose, Bld: 114 mg/dL — ABNORMAL HIGH (ref 70–99)
Potassium: 3.3 mmol/L — ABNORMAL LOW (ref 3.5–5.1)
Sodium: 131 mmol/L — ABNORMAL LOW (ref 135–145)

## 2022-04-14 LAB — MAGNESIUM: Magnesium: 1.9 mg/dL (ref 1.7–2.4)

## 2022-04-14 MED ORDER — METRONIDAZOLE 500 MG PO TABS: 500.0000 mg | ORAL_TABLET | Freq: Two times a day (BID) | ORAL | 0 refills | Status: AC

## 2022-04-14 MED ORDER — POTASSIUM CHLORIDE 20 MEQ PO PACK
40.0000 meq | PACK | Freq: Once | ORAL | Status: AC
  Administered 2022-04-14: 40 meq via ORAL
  Filled 2022-04-14: qty 2

## 2022-04-14 MED ORDER — CIPROFLOXACIN HCL 500 MG PO TABS: 500.0000 mg | ORAL_TABLET | Freq: Two times a day (BID) | ORAL | 0 refills | Status: AC

## 2022-04-14 MED ORDER — ADULT MULTIVITAMIN W/MINERALS CH: 1.0000 | ORAL_TABLET | Freq: Every day | ORAL | Status: DC

## 2022-04-14 MED ORDER — FUROSEMIDE 20 MG PO TABS: 20.0000 mg | ORAL_TABLET | Freq: Every day | ORAL | Status: DC

## 2022-04-14 NOTE — TOC Transition Note (Addendum)
Transition of Care [facility]) - CM/SW Discharge Note   Patient Details  Name: Pamela Reed MRN: [id number] Date of Birth: 1931/09/11  Transition [facility]) CM/SW Contact:  Gildardo Griffes, LCSW Phone Number: 04/14/2022, 2:40 PM   Clinical Narrative:      [facility] will DC to: [facility]  Anticipated DC date: 04/14/22 Family notified:daughter [place] attempted vm, vm is full unable to lvm Transport WN:IOEVO  Per MD patient ready for DC to [facility] . RN, patient, patient's family, and facility notified of DC. Discharge Summary sent to facility. RN given number for report  [phone] room 620B. DC packet on chart. Ambulance transport requested for patient.  CSW signing off.  Angeline Slim, LCSW   Final next level of care: Skilled Nursing Facility Barriers to Discharge: No Barriers Identified   Patient Goals and CMS Choice Patient states their goals for this hospitalization and ongoing recovery are:: to go home CMS Medicare.gov Compare Post Acute Care list provided to:: Patient Choice offered to / list presented to : Patient  Discharge Placement              Patient chooses bed at: [facility] Patient to be transferred to facility by: [facility]   Patient and family notified of of transfer: 04/14/22  Discharge Plan and Services                                     Social Determinants of Health (SDOH) Interventions     Readmission Risk Interventions     View : No data to display.

## 2022-04-14 NOTE — Discharge Summary (Signed)
Physician Discharge Summary   Patient: Pamela Reed MRN: [id number] DOB: February 06, 1931  Admit date:     04/11/2022  Discharge date: 04/14/22  Discharge Physician: Lorella Nimrod   PCP: Lenard Simmer, MD   Recommendations at discharge:  Patient is given 3 more days of antibiotics for diverticulitis, ensure to complete the course. Follow-up with primary care provider within a week Please obtain CBC, BMP and magnesium levels in 1 week.  Discharge Diagnoses: Principal Problem:   Diverticulitis Active Problems:   HTN (hypertension)   Hypokalemia   Hypothyroidism   Anxiety   Physical deconditioning   Known medical problems   Hospital Course: Taken from H&P.   Pamela Reed is a 88 y.o. female with medical history significant for hypertension, hypothyroidism, anxiety, and recent ED visit where she was diagnosed with diverticulitis, who presents with worsening symptoms and inability to take p.o.   Patient was seen 5 days prior on May 15, at that time she reported diarrhea, vomiting, and left lower quadrant abdominal pain.  Underwent a CT scan which showed acute uncomplicated diverticulitis.  She was able to take p.o. at that time and overall well-appearing, she was discharged home on p.o. antibiotics.   Today presents reporting she has fallen multiple times.  She reports that since her ED presentation she has felt progressively weaker and had trouble taking p.o.  She has tried to keep up with water intake.  She reports she has fallen twice, has not hit her head either time.  Has gotten so difficult that she cannot even make it to the bathroom on her own.  She was taking the antibiotic she was prescribed but has not taken them for about 3 days at this point.  Abdominal pain is still present but has lessened slightly since she was last seen.   In the ED initial vital signs notable for hypertension, but otherwise unremarkable.  CMP showed hypokalemia with potassium 2.8, otherwise  unremarkable.  Lactic acid and troponin were normal.  CBC showed downtrending white count from 15-12, mild decrease in hemoglobin from 12-11, otherwise unremarkable.  EKG was obtained which was unremarkable with no ischemic changes and unchanged compared to prior.  Her potassium was repleted and I was consulted to admit her for further management due to worsening symptoms and inability to care for self at home. No colonoscopies on file, while patient is well out of age of routine screening may consider based on clinical picture.  5/21: Patient continued to have some nausea and appetite remains poor.  2 loose bowel movements since this morning, abdominal pain improved. Leukocytosis continued to improve, hemoglobin stable.  Potassium improved but still borderline low at 3.4.  No repeat imaging during current hospitalization. Blood pressure remained elevated at 190/80. Hydralazine 25 mg every 8 hourly added with her home dose of lisinopril and metoprolol.  5/22: PT is recommending SNF.  Had 2 loose bowel movements since morning.  GI pathogen panel negative.  5/23: Patient continued to improve had just 1 loose bowel movement since morning.  Abdominal pain has been resolved.  Nausea and vomiting improved.  Able to tolerate some diet. Niece was present at bedside. Patient was found to have hypokalemia and hypomagnesemia which was repleted.  Apparently she was on magnesium supplement at home but stopped taking them, advised to restart.  She was sent on 3 more days of ciprofloxacin and Flagyl. Lasix can be held for next couple of days until diarrhea completely resolved.  She just had 1  bowel movement since this morning.  Diet should be advanced slowly.  She is being discharged to rehab for further management and to regain her strength back before returning home as advised by our physical therapist.  She will continue the rest of her home medications and follow-up with her providers.   Assessment and  Plan: * Diverticulitis Continue to have nausea and appetite remains quite poor.  Abdominal pain improving.  Had 2 loose bowel movements since this morning.  She was not taking her antibiotics for the past few days at home due to persistent nausea and vomiting.  Diet advanced to full liquid GI pathogen panel negative. - Continue ciprofloxacin 400 mg IV every 12 hours - Continue Flagyl 500 mg every 8 hours IV -Continue with supportive care  HTN (hypertension) Blood pressure remained elevated.  Patient was on maximum dose of lisinopril and metoprolol at home. -Add p.o. hydralazine 25 mg every 8 hourly. -Continue with lisinopril and metoprolol -Continue with as needed IV hydralazine for systolic persistently above 180  Hypokalemia Most likely secondary to GI losses.  Potassium at 3.2, magnesium at 1.8 -Replete potassium and magnesium -Continue to monitor  Hypothyroidism - Continue home Synthroid  Anxiety - Continue home fluoxetine and as needed Xanax  Physical deconditioning Significant decline in ADLs in setting of acute illness from diverticulitis with multiple falls. - PT/OT consult-recommending SNF  Known medical problems Asthma-continue albuterol as needed, steroid/LABA inhaler Anxiety-continue Xanax as needed, daily fluoxetine Glaucoma-continue latanoprost Hypothyroidism-continue Synthroid Hypertension-continue lisinopril, metoprolol GERD-continue omeprazole     Consultants: None Procedures performed: None Disposition: Skilled nursing facility Diet recommendation:  Discharge Diet Orders (From admission, onward)     Start     Ordered   04/14/22 0000  Diet - low sodium heart healthy        04/14/22 1434           Cardiac diet DISCHARGE MEDICATION: Allergies as of 04/14/2022       Reactions   Contrast Media [iodinated Contrast Media] Shortness Of Breath   Iodine Shortness Of Breath   Sulfasalazine Itching, Swelling   Dye Fdc Red [red Dye]    Lactose  Intolerance (gi)    GI upset   Penicillins    Childhood event-unknown reaction   Shellfish Allergy Nausea Only, Swelling   Throat swelling   Sulfa Antibiotics Itching, Swelling   Venlafaxine Swelling   Throat   Codeine Palpitations   Ropinirole Nausea Only        Medication List     TAKE these medications    albuterol 108 (90 Base) MCG/ACT inhaler Commonly known as: VENTOLIN HFA Inhale 2 puffs into the lungs every 6 (six) hours as needed.   ALPRAZolam 0.25 MG tablet Commonly known as: XANAX Take 0.25 mg by mouth 2 (two) times daily as needed.   aspirin 81 MG tablet Take 81 mg by mouth daily.   ciprofloxacin 500 MG tablet Commonly known as: Cipro Take 1 tablet (500 mg total) by mouth 2 (two) times daily for 3 days.   FLUoxetine 10 MG tablet Commonly known as: PROZAC Take 10 mg by mouth daily.   fluticasone 50 MCG/ACT nasal spray Commonly known as: FLONASE Place 2 sprays into both nostrils daily.   furosemide 20 MG tablet Commonly known as: LASIX Take 1 tablet (20 mg total) by mouth daily. Please hold until diarrhea completely resolved What changed: additional instructions   levothyroxine 50 MCG tablet Commonly known as: SYNTHROID Take 50 mcg by mouth daily before breakfast.  lisinopril 20 MG tablet Commonly known as: ZESTRIL Take 20 mg by mouth 2 (two) times daily.   Magnesium 500 MG Tabs Take 500 mg by mouth daily.   metoprolol succinate 25 MG 24 hr tablet Commonly known as: TOPROL-XL Take 25 mg by mouth 2 (two) times daily.   metroNIDAZOLE 500 MG tablet Commonly known as: Flagyl Take 1 tablet (500 mg total) by mouth 2 (two) times daily for 3 days. What changed: when to take this   multivitamin with minerals Tabs tablet Take 1 tablet by mouth daily. Start taking on: Apr 15, 2022   omeprazole 20 MG tablet Commonly known as: PRILOSEC OTC Take 20 mg by mouth daily as needed (heartburn).   ondansetron 4 MG tablet Commonly known as:  Zofran Take 1 tablet (4 mg total) by mouth daily as needed for up to 10 days for nausea or vomiting.   Rhopressa 0.02 % Soln Generic drug: Netarsudil Dimesylate Place 1 drop into both eyes at bedtime.   simvastatin 5 MG tablet Commonly known as: ZOCOR Take 5 mg by mouth at bedtime.   Symbicort 80-4.5 MCG/ACT inhaler Generic drug: budesonide-formoterol Inhale into the lungs.   Vyzulta 0.024 % Soln Generic drug: Latanoprostene Bunod Place 1 drop into both eyes at bedtime.        Contact information for follow-up providers     Morayati, Lourdes Sledge, MD. Schedule an appointment as soon as possible for a visit in 1 week(s).   Specialty: Endocrinology Contact information: [place] [address] [phone]              Contact information for after-discharge care     Destination     [facility] Preferred SNF .   Service: Skilled Nursing Contact information: [address] [place] [phone]                    Discharge Exam: Danley Danker Weights   04/11/22 1213  Weight: 77.1 kg   General.     In no acute distress. Pulmonary.  Lungs clear bilaterally, normal respiratory effort. CV.  Regular rate and rhythm, no JVD, rub or murmur. Abdomen.  Soft, nontender, nondistended, BS positive. CNS.  Alert and oriented .  No focal neurologic deficit. Extremities.  No edema, no cyanosis, pulses intact and symmetrical. Psychiatry.  Judgment and insight appears normal.   Condition at discharge: stable  The results of significant diagnostics from this hospitalization (including imaging, microbiology, ancillary and laboratory) are listed below for reference.   Imaging Studies: CT ABDOMEN PELVIS WO CONTRAST  Result Date: 04/06/2022 CLINICAL DATA:  Left lower quadrant pain EXAM: CT ABDOMEN AND PELVIS WITHOUT CONTRAST TECHNIQUE: Multidetector CT imaging of the abdomen and pelvis was performed following the  standard protocol without IV contrast. RADIATION DOSE REDUCTION: This exam was performed according to the departmental dose-optimization program which includes automated exposure control, adjustment of the mA and/or kV according to patient size and/or use of iterative reconstruction technique. COMPARISON:  CT abdomen and pelvis 03/18/2021 FINDINGS: Lower chest: Chronic changes in the lower lungs.  Cardiomegaly. Hepatobiliary: Liver is normal in size and contour with no suspicious mass identified. Gallbladder is surgically absent. No biliary ductal dilatation identified. Pancreas: Unremarkable. No pancreatic ductal dilatation or surrounding inflammatory changes. Spleen: Normal in size without focal abnormality. Adrenals/Urinary Tract: Adrenal glands appear normal. No nephrolithiasis or hydronephrosis identified bilaterally. Urinary bladder is normal. Stomach/Bowel: Moderate-sized hiatal hernia. No bowel obstruction, free air or pneumatosis. Colonic  diverticulosis. There is mild inflammatory fat stranding surrounding the distal sigmoid colon consistent with acute diverticulitis. No associated abscess. Appendix not visualized. Vascular/Lymphatic: Aortic atherosclerosis. No enlarged abdominal or pelvic lymph nodes. Reproductive: Status post hysterectomy. No adnexal masses. Other: No ascites. Musculoskeletal: Degenerative changes in the spine. No suspicious bony lesions identified. IMPRESSION: 1. Colonic diverticulosis with mild acute diverticulitis at the distal sigmoid colon. 2. Moderate-sized hiatal hernia. Electronically Signed   By: Ofilia Neas M.D.   On: 04/06/2022 19:02   DG Chest 2 View  Result Date: 04/06/2022 CLINICAL DATA:  Weakness EXAM: CHEST - 2 VIEW COMPARISON:  03/16/2021 FINDINGS: Heart and mediastinal contours are within normal limits. No focal opacities or effusions. No acute bony abnormality. Scattered aortic calcifications. Moderate-sized hiatal hernia. IMPRESSION: No active cardiopulmonary  disease. Electronically Signed   By: Rolm Baptise M.D.   On: 04/06/2022 17:27    Microbiology: Results for orders placed or performed during the hospital encounter of 04/11/22  Gastrointestinal Panel by PCR , Stool     Status: None   Collection Time: 04/12/22  7:58 PM   Specimen: Stool  Result Value Ref Range Status   Campylobacter species NOT DETECTED NOT DETECTED Final   Plesimonas shigelloides NOT DETECTED NOT DETECTED Final   Salmonella species NOT DETECTED NOT DETECTED Final   Yersinia enterocolitica NOT DETECTED NOT DETECTED Final   Vibrio species NOT DETECTED NOT DETECTED Final   Vibrio cholerae NOT DETECTED NOT DETECTED Final   Enteroaggregative E coli (EAEC) NOT DETECTED NOT DETECTED Final   Enteropathogenic E coli (EPEC) NOT DETECTED NOT DETECTED Final   Enterotoxigenic E coli (ETEC) NOT DETECTED NOT DETECTED Final   Shiga like toxin producing E coli (STEC) NOT DETECTED NOT DETECTED Final   Shigella/Enteroinvasive E coli (EIEC) NOT DETECTED NOT DETECTED Final   Cryptosporidium NOT DETECTED NOT DETECTED Final   Cyclospora cayetanensis NOT DETECTED NOT DETECTED Final   Entamoeba histolytica NOT DETECTED NOT DETECTED Final   Giardia lamblia NOT DETECTED NOT DETECTED Final   Adenovirus F40/41 NOT DETECTED NOT DETECTED Final   Astrovirus NOT DETECTED NOT DETECTED Final   Norovirus GI/GII NOT DETECTED NOT DETECTED Final   Rotavirus A NOT DETECTED NOT DETECTED Final   Sapovirus (I, II, IV, and V) NOT DETECTED NOT DETECTED Final    Comment: Performed at [facility], [address] [place], [place] [address]    Labs: CBC: Recent Labs  Lab 04/11/22 1437 04/12/22 0416  WBC 12.4* 10.8*  NEUTROABS 9.0*  --   HGB 11.0* 10.9*  HCT 34.5* 34.3*  MCV 89.6 89.6  PLT 353 [id number]   Basic Metabolic Panel: Recent Labs  Lab 04/11/22 1437 04/12/22 0416 04/13/22 0511 04/14/22 0354  NA 134* 139 134* 131*  K 2.8* 3.4* 3.2* 3.3*  CL 101 106 102 100  CO2 25 25 23 23    GLUCOSE 119* 108* 114* 114*  BUN 7* 7* 7* 10  CREATININE 0.80 0.82 0.80 0.91  CALCIUM 8.3* 8.5* 8.2* 7.9*  MG  --  1.8  --  1.9   Liver Function Tests: Recent Labs  Lab 04/11/22 1437  AST 17  ALT 13  ALKPHOS 54  BILITOT 1.0  PROT 6.5  ALBUMIN 3.0*   CBG: No results for input(s): GLUCAP in the last 168 hours.  Discharge time spent: greater than 30 minutes.  This record has been created using Systems analyst. Errors have been sought and corrected,but may not always be located. Such creation errors do not  reflect on the standard of care.   Signed: Lorella Nimrod, MD [facility] 04/14/2022

## 2022-04-14 NOTE — Progress Notes (Signed)
Report given to Lattie Haw, RN at [facility].PIVX1 removed with catheter intact.

## 2022-04-14 NOTE — Progress Notes (Signed)
Physical Therapy Treatment Patient Details Name: Pamela Reed MRN: [id number] DOB: 1931/02/08 Today's Date: 04/14/2022   History of Present Illness Pt is an 88 y/o F admitted on 04/11/22. Pt with a recent ED visit where she was diagnosed with diverticulitis who presents now with worsening symptoms & inability to take p.o. & pt now c/o falling multiple times. 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 Agreeable to sit at EOB and was able to tolerate for extended time this session. Able to perform supine there-ex. Will continue to progress as able.   Recommendations for follow up therapy are one component of a multi-disciplinary discharge planning process, led by the attending physician.  Recommendations may be updated based on patient status, additional functional criteria and insurance authorization.  Follow Up Recommendations  Skilled nursing-short term rehab (<3 hours/day)     Assistance Recommended at Discharge Frequent or constant Supervision/Assistance  Patient can return home with the following A lot of help with walking and/or transfers;A lot of help with bathing/dressing/bathroom;Assist for transportation;Assistance with cooking/housework;Help with stairs or ramp for entrance;Direct supervision/assist for financial management   Equipment Recommendations  None recommended by PT    Recommendations for Other Services       Precautions / Restrictions Precautions Precautions: Fall Restrictions Weight Bearing Restrictions: No     Mobility  Bed Mobility Overal bed mobility: Needs Assistance Bed Mobility: Supine to Sit, Sit to Supine     Supine to sit: Mod assist Sit to supine: Mod assist   General bed mobility comments: able to use railing for assistance. Mod assist for trunkal elevation. Able to tolerate sitting at EOB x 10  min prior to slight fatigue and nausea symptoms. Assist for return back to bed with max assist for respositioning in bed    Transfers                   General transfer comment: unable to perform due to nausea    Ambulation/Gait                   Stairs             Wheelchair Mobility    Modified Rankin (Stroke Patients Only)       Balance Overall balance assessment: Needs assistance, History of Falls Sitting-balance support: Feet supported, Bilateral upper extremity supported Sitting balance-Leahy Scale: Fair Sitting balance - Comments: needed B UE support on bed rails with initial min assist to maintain balance. Able to decrease to supervision with time.                                    Cognition Arousal/Alertness: Awake/alert Behavior During Therapy: WFL for tasks assessed/performed Overall Cognitive Status: Within Functional Limits for tasks assessed                                 General Comments: alert and cooperative. Can recall events throughout the day        Exercises Other Exercises Other Exercises: supine ther-ex performed on B LE including AP, quad sets, SLRs, hip abd/add, SAQ, hip add squeezes and seated LAQ. 12 reps with cga/min assist Other Exercises: Able to tolerate  sitting at EOB with initial min assist and B UE support, progressing to supervision. Able to take sips of drink while at bedside.    General Comments        Pertinent Vitals/Pain Pain Assessment Pain Assessment: Faces Faces Pain Scale: Hurts a little bit Pain Location: abdomen Pain Descriptors / Indicators: Discomfort Pain Intervention(s): Limited activity within patient's tolerance, Repositioned    Home Living                          Prior Function            PT Goals (current goals can now be found in the care plan section) Acute Rehab PT Goals Patient Stated Goal: get better PT Goal Formulation: With  patient Time For Goal Achievement: 04/26/22 Potential to Achieve Goals: Fair Progress towards PT goals: Progressing toward goals    Frequency    Min 2X/week      PT Plan Current plan remains appropriate    Co-evaluation              AM-PAC PT "6 Clicks" Mobility   Outcome Measure  Help needed turning from your back to your side while in a flat bed without using bedrails?: A Little Help needed moving from lying on your back to sitting on the side of a flat bed without using bedrails?: A Lot Help needed moving to and from a bed to a chair (including a wheelchair)?: Total Help needed standing up from a chair using your arms (e.g., wheelchair or bedside chair)?: Total Help needed to walk in hospital room?: Total Help needed climbing 3-5 steps with a railing? : Total 6 Click Score: 9    End of Session   Activity Tolerance: Patient limited by fatigue Patient left: in bed;with call bell/phone within reach;with bed alarm set Nurse Communication: Mobility status PT Visit Diagnosis: Unsteadiness on feet (R26.81);Difficulty in walking, not elsewhere classified (R26.2);Muscle weakness (generalized) (M62.81);History of falling (Z91.81)     Time: 3154-0086 PT Time Calculation (min) (ACUTE ONLY): 24 min  Charges:  $Therapeutic Exercise: 23-37 mins                     Elizabeth Palau, PT, DPT, GCS [phone]    Rhilyn Battle 04/14/2022, 3:12 PM

## 2022-04-14 NOTE — Care Management Important Message (Signed)
Important Message  Patient Details  Name: Pamela Reed MRN: [id number] Date of Birth: 1931-10-14   Medicare Important Message Given:  Yes     [name] A [name] 04/14/2022, 1:22 PM

## 2022-04-14 NOTE — Plan of Care (Signed)

## 2022-04-14 NOTE — Progress Notes (Signed)
Nutrition Follow-up  DOCUMENTATION CODES:   Obesity unspecified  INTERVENTION:   -D/c Boost Breeze po TID, each supplement provides 250 kcal and 9 grams of protein  -MVI with minerals daily -Ensure Enlive po TID, each supplement provides 350 kcal and 20 grams of protein.   NUTRITION DIAGNOSIS:   Increased nutrient needs related to acute illness as evidenced by estimated needs.  Ongoing  GOAL:   Patient will meet greater than or equal to 90% of their needs  Progressing   MONITOR:   Supplement acceptance, Diet advancement, PO intake  REASON FOR ASSESSMENT:   Malnutrition Screening Tool    ASSESSMENT:   Pt with medical history significant for hypertension, hypothyroidism, anxiety, and recent ED visit where she was diagnosed with diverticulitis, who presents with worsening symptoms and inability to take p.o.  5/22- advanced to full liquid diet  Reviewed I/O's: +2.4 L x 24 hours and +3.4 L since admission  UOP: 200 ml x 24 hours   Spoke with pt and daughter at bedside. Pt reports feeling a little bit better and able to keep foods down now. Noted pt consumed about all of her soup for lunch.   Per pt, she has experienced a general decline in health over the past 3 weeks secondary to intermittent nausea and diarrhea, which has worsened over the past 8 days. Pt reports she has been unable to keep down food or liquids for 8 days PTA. Noted meal completions 45-50%.   Pt endorses wt loss; reports her UBW is around 180#. She explains that she has been hospitalized every year over the past year secondary to COVID, which she think contributed to her weight loss. At baseline, pt is veyr active and enjoys dancing and shopping.   Discussed importance of good meal and supplement intake to promote healing. Pt does not like Boost Breeze, but would like to try Ensure, as she was drinking this PTA.   Per TOC notes, plan to d/c to SNF ([facility]) once medically stable.   Medications  reviewed and include lactated ringers infusion @ 100 ml/hr and cirpo.   Labs reviewed: Na: 131, K: 3.3, CBGS: 154.   NUTRITION - FOCUSED PHYSICAL EXAM:  Flowsheet Row Most Recent Value  Orbital Region No depletion  Upper Arm Region Mild depletion  Thoracic and Lumbar Region No depletion  Buccal Region No depletion  Temple Region No depletion  Clavicle Bone Region No depletion  Clavicle and Acromion Bone Region No depletion  Scapular Bone Region No depletion  Dorsal Hand No depletion  Patellar Region No depletion  Anterior Thigh Region No depletion  Posterior Calf Region No depletion  Edema (RD Assessment) None  Hair Reviewed  Eyes Reviewed  Mouth Reviewed  Skin Reviewed  Nails Reviewed       Diet Order:   Diet Order             Diet - low sodium heart healthy           Diet full liquid Room service appropriate? Yes; Fluid consistency: Thin  Diet effective now                   EDUCATION NEEDS:   No education needs have been identified at this time  Skin:  Skin Assessment: Reviewed RN Assessment  Last BM:  04/12/22  Height:   Ht Readings from Last 1 Encounters:  04/11/22 5\' 2"  (1.575 m)    Weight:   Wt Readings from Last 1 Encounters:  04/11/22  77.1 kg    Ideal Body Weight:  50 kg  BMI:  Body mass index is 31.09 kg/m.  Estimated Nutritional Needs:   Kcal:  1650-1850  Protein:  85-100 grams  Fluid:  > 1.6 L    Levada Schilling, RD, LDN, CDCES Registered Dietitian II Certified Diabetes Care and Education Specialist Please refer to [facility] for RD and/or RD on-call/weekend/after hours pager

## 2022-04-15 DIAGNOSIS — E876 Hypokalemia: Secondary | ICD-10-CM | POA: Diagnosis not present

## 2022-04-15 DIAGNOSIS — K5780 Diverticulitis of intestine, part unspecified, with perforation and abscess without bleeding: Secondary | ICD-10-CM | POA: Diagnosis not present

## 2022-04-15 DIAGNOSIS — I10 Essential (primary) hypertension: Secondary | ICD-10-CM | POA: Diagnosis not present

## 2022-04-15 DIAGNOSIS — M6281 Muscle weakness (generalized): Secondary | ICD-10-CM | POA: Diagnosis not present

## 2022-04-16 DIAGNOSIS — I10 Essential (primary) hypertension: Secondary | ICD-10-CM | POA: Diagnosis not present

## 2022-04-16 DIAGNOSIS — K5792 Diverticulitis of intestine, part unspecified, without perforation or abscess without bleeding: Secondary | ICD-10-CM | POA: Diagnosis not present

## 2022-04-16 DIAGNOSIS — M6281 Muscle weakness (generalized): Secondary | ICD-10-CM | POA: Diagnosis not present

## 2022-04-16 DIAGNOSIS — E876 Hypokalemia: Secondary | ICD-10-CM | POA: Diagnosis not present

## 2022-04-17 DIAGNOSIS — J449 Chronic obstructive pulmonary disease, unspecified: Secondary | ICD-10-CM | POA: Diagnosis not present

## 2022-04-17 DIAGNOSIS — K5733 Diverticulitis of large intestine without perforation or abscess with bleeding: Secondary | ICD-10-CM | POA: Diagnosis not present

## 2022-04-17 DIAGNOSIS — D509 Iron deficiency anemia, unspecified: Secondary | ICD-10-CM | POA: Diagnosis not present

## 2022-04-17 DIAGNOSIS — K5792 Diverticulitis of intestine, part unspecified, without perforation or abscess without bleeding: Secondary | ICD-10-CM | POA: Diagnosis not present

## 2022-04-17 DIAGNOSIS — I10 Essential (primary) hypertension: Secondary | ICD-10-CM | POA: Diagnosis not present

## 2022-04-17 DIAGNOSIS — R197 Diarrhea, unspecified: Secondary | ICD-10-CM | POA: Diagnosis not present

## 2022-04-17 DIAGNOSIS — E039 Hypothyroidism, unspecified: Secondary | ICD-10-CM | POA: Diagnosis not present

## 2022-04-17 DIAGNOSIS — R5383 Other fatigue: Secondary | ICD-10-CM | POA: Diagnosis not present

## 2022-04-17 DIAGNOSIS — M150 Primary generalized (osteo)arthritis: Secondary | ICD-10-CM | POA: Diagnosis not present

## 2022-04-17 DIAGNOSIS — S62002D Unspecified fracture of navicular [scaphoid] bone of left wrist, subsequent encounter for fracture with routine healing: Secondary | ICD-10-CM | POA: Diagnosis not present

## 2022-04-17 DIAGNOSIS — K921 Melena: Secondary | ICD-10-CM | POA: Diagnosis not present

## 2022-04-17 DIAGNOSIS — R1312 Dysphagia, oropharyngeal phase: Secondary | ICD-10-CM | POA: Diagnosis not present

## 2022-04-17 DIAGNOSIS — K5732 Diverticulitis of large intestine without perforation or abscess without bleeding: Secondary | ICD-10-CM | POA: Diagnosis not present

## 2022-04-17 DIAGNOSIS — Z7982 Long term (current) use of aspirin: Secondary | ICD-10-CM | POA: Diagnosis not present

## 2022-04-17 DIAGNOSIS — F419 Anxiety disorder, unspecified: Secondary | ICD-10-CM | POA: Diagnosis not present

## 2022-04-17 DIAGNOSIS — Z91041 Radiographic dye allergy status: Secondary | ICD-10-CM | POA: Diagnosis not present

## 2022-04-17 DIAGNOSIS — D649 Anemia, unspecified: Secondary | ICD-10-CM | POA: Diagnosis not present

## 2022-04-17 DIAGNOSIS — K5793 Diverticulitis of intestine, part unspecified, without perforation or abscess with bleeding: Secondary | ICD-10-CM | POA: Diagnosis not present

## 2022-04-17 DIAGNOSIS — S62015D Nondisplaced fracture of distal pole of navicular [scaphoid] bone of left wrist, subsequent encounter for fracture with routine healing: Secondary | ICD-10-CM | POA: Diagnosis not present

## 2022-04-17 DIAGNOSIS — K449 Diaphragmatic hernia without obstruction or gangrene: Secondary | ICD-10-CM | POA: Diagnosis not present

## 2022-04-17 DIAGNOSIS — R112 Nausea with vomiting, unspecified: Secondary | ICD-10-CM | POA: Diagnosis not present

## 2022-04-17 DIAGNOSIS — Z880 Allergy status to penicillin: Secondary | ICD-10-CM | POA: Diagnosis not present

## 2022-04-17 DIAGNOSIS — Z79899 Other long term (current) drug therapy: Secondary | ICD-10-CM | POA: Diagnosis not present

## 2022-04-17 DIAGNOSIS — E876 Hypokalemia: Secondary | ICD-10-CM | POA: Diagnosis not present

## 2022-04-17 DIAGNOSIS — Q398 Other congenital malformations of esophagus: Secondary | ICD-10-CM | POA: Diagnosis not present

## 2022-04-17 DIAGNOSIS — Z882 Allergy status to sulfonamides status: Secondary | ICD-10-CM | POA: Diagnosis not present

## 2022-04-17 DIAGNOSIS — E785 Hyperlipidemia, unspecified: Secondary | ICD-10-CM | POA: Diagnosis not present

## 2022-04-17 DIAGNOSIS — M6281 Muscle weakness (generalized): Secondary | ICD-10-CM | POA: Diagnosis not present

## 2022-04-17 DIAGNOSIS — H409 Unspecified glaucoma: Secondary | ICD-10-CM | POA: Diagnosis not present

## 2022-04-17 DIAGNOSIS — R279 Unspecified lack of coordination: Secondary | ICD-10-CM | POA: Diagnosis not present

## 2022-04-17 DIAGNOSIS — R531 Weakness: Secondary | ICD-10-CM | POA: Diagnosis not present

## 2022-04-17 DIAGNOSIS — M25532 Pain in left wrist: Secondary | ICD-10-CM | POA: Diagnosis not present

## 2022-04-17 DIAGNOSIS — Z885 Allergy status to narcotic agent status: Secondary | ICD-10-CM | POA: Diagnosis not present

## 2022-04-17 DIAGNOSIS — J45909 Unspecified asthma, uncomplicated: Secondary | ICD-10-CM | POA: Diagnosis not present

## 2022-04-17 DIAGNOSIS — Q399 Congenital malformation of esophagus, unspecified: Secondary | ICD-10-CM | POA: Diagnosis not present

## 2022-04-17 DIAGNOSIS — M7989 Other specified soft tissue disorders: Secondary | ICD-10-CM | POA: Diagnosis not present

## 2022-04-17 DIAGNOSIS — I272 Pulmonary hypertension, unspecified: Secondary | ICD-10-CM | POA: Diagnosis not present

## 2022-04-17 DIAGNOSIS — R339 Retention of urine, unspecified: Secondary | ICD-10-CM | POA: Diagnosis not present

## 2022-04-17 DIAGNOSIS — E871 Hypo-osmolality and hyponatremia: Secondary | ICD-10-CM | POA: Diagnosis not present

## 2022-04-17 DIAGNOSIS — Z66 Do not resuscitate: Secondary | ICD-10-CM | POA: Diagnosis not present

## 2022-04-17 DIAGNOSIS — R5381 Other malaise: Secondary | ICD-10-CM | POA: Diagnosis not present

## 2022-04-17 DIAGNOSIS — R937 Abnormal findings on diagnostic imaging of other parts of musculoskeletal system: Secondary | ICD-10-CM | POA: Diagnosis not present

## 2022-04-17 DIAGNOSIS — K922 Gastrointestinal hemorrhage, unspecified: Secondary | ICD-10-CM | POA: Diagnosis not present

## 2022-04-17 DIAGNOSIS — D75839 Thrombocytosis, unspecified: Secondary | ICD-10-CM | POA: Diagnosis not present

## 2022-04-17 DIAGNOSIS — W19XXXD Unspecified fall, subsequent encounter: Secondary | ICD-10-CM | POA: Diagnosis not present

## 2022-04-21 HISTORY — PX: ESOPHAGOGASTRODUODENOSCOPY: SHX1529

## 2022-04-29 DIAGNOSIS — R195 Other fecal abnormalities: Secondary | ICD-10-CM | POA: Diagnosis not present

## 2022-04-29 DIAGNOSIS — N3020 Other chronic cystitis without hematuria: Secondary | ICD-10-CM | POA: Diagnosis not present

## 2022-04-29 DIAGNOSIS — D500 Iron deficiency anemia secondary to blood loss (chronic): Secondary | ICD-10-CM | POA: Diagnosis not present

## 2022-04-29 DIAGNOSIS — Z882 Allergy status to sulfonamides status: Secondary | ICD-10-CM | POA: Diagnosis not present

## 2022-04-29 DIAGNOSIS — I10 Essential (primary) hypertension: Secondary | ICD-10-CM | POA: Diagnosis not present

## 2022-04-29 DIAGNOSIS — R279 Unspecified lack of coordination: Secondary | ICD-10-CM | POA: Diagnosis not present

## 2022-04-29 DIAGNOSIS — R1084 Generalized abdominal pain: Secondary | ICD-10-CM | POA: Diagnosis not present

## 2022-04-29 DIAGNOSIS — J449 Chronic obstructive pulmonary disease, unspecified: Secondary | ICD-10-CM | POA: Diagnosis not present

## 2022-04-29 DIAGNOSIS — E876 Hypokalemia: Secondary | ICD-10-CM | POA: Diagnosis not present

## 2022-04-29 DIAGNOSIS — R5381 Other malaise: Secondary | ICD-10-CM | POA: Diagnosis not present

## 2022-04-29 DIAGNOSIS — E440 Moderate protein-calorie malnutrition: Secondary | ICD-10-CM | POA: Diagnosis not present

## 2022-04-29 DIAGNOSIS — K5730 Diverticulosis of large intestine without perforation or abscess without bleeding: Secondary | ICD-10-CM | POA: Diagnosis not present

## 2022-04-29 DIAGNOSIS — D75839 Thrombocytosis, unspecified: Secondary | ICD-10-CM | POA: Diagnosis not present

## 2022-04-29 DIAGNOSIS — K922 Gastrointestinal hemorrhage, unspecified: Secondary | ICD-10-CM | POA: Diagnosis not present

## 2022-04-29 DIAGNOSIS — M6281 Muscle weakness (generalized): Secondary | ICD-10-CM | POA: Diagnosis not present

## 2022-04-29 DIAGNOSIS — K921 Melena: Secondary | ICD-10-CM | POA: Diagnosis not present

## 2022-04-29 DIAGNOSIS — Z79890 Hormone replacement therapy (postmenopausal): Secondary | ICD-10-CM | POA: Diagnosis not present

## 2022-04-29 DIAGNOSIS — K219 Gastro-esophageal reflux disease without esophagitis: Secondary | ICD-10-CM | POA: Diagnosis not present

## 2022-04-29 DIAGNOSIS — H9192 Unspecified hearing loss, left ear: Secondary | ICD-10-CM | POA: Diagnosis not present

## 2022-04-29 DIAGNOSIS — R339 Retention of urine, unspecified: Secondary | ICD-10-CM | POA: Diagnosis not present

## 2022-04-29 DIAGNOSIS — Z7951 Long term (current) use of inhaled steroids: Secondary | ICD-10-CM | POA: Diagnosis not present

## 2022-04-29 DIAGNOSIS — Z66 Do not resuscitate: Secondary | ICD-10-CM | POA: Diagnosis not present

## 2022-04-29 DIAGNOSIS — D508 Other iron deficiency anemias: Secondary | ICD-10-CM | POA: Diagnosis not present

## 2022-04-29 DIAGNOSIS — Z888 Allergy status to other drugs, medicaments and biological substances status: Secondary | ICD-10-CM | POA: Diagnosis not present

## 2022-04-29 DIAGNOSIS — Z79899 Other long term (current) drug therapy: Secondary | ICD-10-CM | POA: Diagnosis not present

## 2022-04-29 DIAGNOSIS — K259 Gastric ulcer, unspecified as acute or chronic, without hemorrhage or perforation: Secondary | ICD-10-CM | POA: Diagnosis not present

## 2022-04-29 DIAGNOSIS — R0902 Hypoxemia: Secondary | ICD-10-CM | POA: Diagnosis not present

## 2022-04-29 DIAGNOSIS — J45909 Unspecified asthma, uncomplicated: Secondary | ICD-10-CM | POA: Diagnosis not present

## 2022-04-29 DIAGNOSIS — M1990 Unspecified osteoarthritis, unspecified site: Secondary | ICD-10-CM | POA: Diagnosis not present

## 2022-04-29 DIAGNOSIS — E871 Hypo-osmolality and hyponatremia: Secondary | ICD-10-CM | POA: Diagnosis not present

## 2022-04-29 DIAGNOSIS — R1312 Dysphagia, oropharyngeal phase: Secondary | ICD-10-CM | POA: Diagnosis not present

## 2022-04-29 DIAGNOSIS — R197 Diarrhea, unspecified: Secondary | ICD-10-CM | POA: Diagnosis not present

## 2022-04-29 DIAGNOSIS — E039 Hypothyroidism, unspecified: Secondary | ICD-10-CM | POA: Diagnosis not present

## 2022-04-29 DIAGNOSIS — R112 Nausea with vomiting, unspecified: Secondary | ICD-10-CM | POA: Diagnosis not present

## 2022-04-29 DIAGNOSIS — N3946 Mixed incontinence: Secondary | ICD-10-CM | POA: Diagnosis not present

## 2022-04-29 DIAGNOSIS — K449 Diaphragmatic hernia without obstruction or gangrene: Secondary | ICD-10-CM | POA: Diagnosis not present

## 2022-04-29 DIAGNOSIS — R937 Abnormal findings on diagnostic imaging of other parts of musculoskeletal system: Secondary | ICD-10-CM | POA: Diagnosis not present

## 2022-04-29 DIAGNOSIS — D649 Anemia, unspecified: Secondary | ICD-10-CM | POA: Diagnosis not present

## 2022-04-29 DIAGNOSIS — S62015D Nondisplaced fracture of distal pole of navicular [scaphoid] bone of left wrist, subsequent encounter for fracture with routine healing: Secondary | ICD-10-CM | POA: Diagnosis not present

## 2022-04-29 DIAGNOSIS — M150 Primary generalized (osteo)arthritis: Secondary | ICD-10-CM | POA: Diagnosis not present

## 2022-04-29 DIAGNOSIS — Z974 Presence of external hearing-aid: Secondary | ICD-10-CM | POA: Diagnosis not present

## 2022-04-29 DIAGNOSIS — D509 Iron deficiency anemia, unspecified: Secondary | ICD-10-CM | POA: Diagnosis not present

## 2022-04-29 DIAGNOSIS — F419 Anxiety disorder, unspecified: Secondary | ICD-10-CM | POA: Diagnosis not present

## 2022-04-29 DIAGNOSIS — R110 Nausea: Secondary | ICD-10-CM | POA: Diagnosis not present

## 2022-04-29 DIAGNOSIS — K5793 Diverticulitis of intestine, part unspecified, without perforation or abscess with bleeding: Secondary | ICD-10-CM | POA: Diagnosis not present

## 2022-04-29 DIAGNOSIS — M7989 Other specified soft tissue disorders: Secondary | ICD-10-CM | POA: Diagnosis not present

## 2022-04-29 DIAGNOSIS — M25532 Pain in left wrist: Secondary | ICD-10-CM | POA: Diagnosis not present

## 2022-05-04 ENCOUNTER — Inpatient Hospital Stay (HOSPITAL_COMMUNITY)
Admission: EM | Admit: 2022-05-04 | Discharge: 2022-05-12 | DRG: 812 | Disposition: A | Discharge status: Skilled Nursing Facility | Payer: Medicare HMO | Attending: Internal Medicine | Admitting: Internal Medicine

## 2022-05-04 ENCOUNTER — Encounter (HOSPITAL_COMMUNITY): Payer: Self-pay

## 2022-05-04 ENCOUNTER — Other Ambulatory Visit: Payer: Self-pay

## 2022-05-04 DIAGNOSIS — F419 Anxiety disorder, unspecified: Secondary | ICD-10-CM | POA: Diagnosis not present

## 2022-05-04 DIAGNOSIS — Z66 Do not resuscitate: Secondary | ICD-10-CM | POA: Diagnosis not present

## 2022-05-04 DIAGNOSIS — K922 Gastrointestinal hemorrhage, unspecified: Secondary | ICD-10-CM | POA: Diagnosis not present

## 2022-05-04 DIAGNOSIS — N3946 Mixed incontinence: Secondary | ICD-10-CM | POA: Diagnosis not present

## 2022-05-04 DIAGNOSIS — E440 Moderate protein-calorie malnutrition: Secondary | ICD-10-CM | POA: Diagnosis not present

## 2022-05-04 DIAGNOSIS — Z7951 Long term (current) use of inhaled steroids: Secondary | ICD-10-CM

## 2022-05-04 DIAGNOSIS — J45998 Other asthma: Secondary | ICD-10-CM | POA: Diagnosis not present

## 2022-05-04 DIAGNOSIS — E871 Hypo-osmolality and hyponatremia: Secondary | ICD-10-CM | POA: Diagnosis not present

## 2022-05-04 DIAGNOSIS — E039 Hypothyroidism, unspecified: Secondary | ICD-10-CM | POA: Diagnosis present

## 2022-05-04 DIAGNOSIS — Z743 Need for continuous supervision: Secondary | ICD-10-CM | POA: Diagnosis not present

## 2022-05-04 DIAGNOSIS — D500 Iron deficiency anemia secondary to blood loss (chronic): Secondary | ICD-10-CM | POA: Diagnosis not present

## 2022-05-04 DIAGNOSIS — H9192 Unspecified hearing loss, left ear: Secondary | ICD-10-CM | POA: Diagnosis not present

## 2022-05-04 DIAGNOSIS — K449 Diaphragmatic hernia without obstruction or gangrene: Secondary | ICD-10-CM | POA: Diagnosis not present

## 2022-05-04 DIAGNOSIS — Z79890 Hormone replacement therapy (postmenopausal): Secondary | ICD-10-CM | POA: Diagnosis not present

## 2022-05-04 DIAGNOSIS — D508 Other iron deficiency anemias: Secondary | ICD-10-CM | POA: Diagnosis not present

## 2022-05-04 DIAGNOSIS — K259 Gastric ulcer, unspecified as acute or chronic, without hemorrhage or perforation: Secondary | ICD-10-CM | POA: Diagnosis not present

## 2022-05-04 DIAGNOSIS — E161 Other hypoglycemia: Secondary | ICD-10-CM | POA: Diagnosis not present

## 2022-05-04 DIAGNOSIS — Z888 Allergy status to other drugs, medicaments and biological substances status: Secondary | ICD-10-CM

## 2022-05-04 DIAGNOSIS — K5730 Diverticulosis of large intestine without perforation or abscess without bleeding: Secondary | ICD-10-CM | POA: Diagnosis not present

## 2022-05-04 DIAGNOSIS — F32A Depression, unspecified: Secondary | ICD-10-CM | POA: Diagnosis not present

## 2022-05-04 DIAGNOSIS — Z9049 Acquired absence of other specified parts of digestive tract: Secondary | ICD-10-CM

## 2022-05-04 DIAGNOSIS — E162 Hypoglycemia, unspecified: Secondary | ICD-10-CM | POA: Diagnosis not present

## 2022-05-04 DIAGNOSIS — J449 Chronic obstructive pulmonary disease, unspecified: Secondary | ICD-10-CM | POA: Diagnosis not present

## 2022-05-04 DIAGNOSIS — M6259 Muscle wasting and atrophy, not elsewhere classified, multiple sites: Secondary | ICD-10-CM | POA: Diagnosis not present

## 2022-05-04 DIAGNOSIS — Z7401 Bed confinement status: Secondary | ICD-10-CM | POA: Diagnosis not present

## 2022-05-04 DIAGNOSIS — E785 Hyperlipidemia, unspecified: Secondary | ICD-10-CM | POA: Diagnosis not present

## 2022-05-04 DIAGNOSIS — E569 Vitamin deficiency, unspecified: Secondary | ICD-10-CM | POA: Diagnosis not present

## 2022-05-04 DIAGNOSIS — Z974 Presence of external hearing-aid: Secondary | ICD-10-CM

## 2022-05-04 DIAGNOSIS — T68XXXA Hypothermia, initial encounter: Secondary | ICD-10-CM | POA: Diagnosis not present

## 2022-05-04 DIAGNOSIS — Z9102 Food additives allergy status: Secondary | ICD-10-CM

## 2022-05-04 DIAGNOSIS — Z961 Presence of intraocular lens: Secondary | ICD-10-CM | POA: Diagnosis present

## 2022-05-04 DIAGNOSIS — D75839 Thrombocytosis, unspecified: Secondary | ICD-10-CM | POA: Diagnosis present

## 2022-05-04 DIAGNOSIS — Z741 Need for assistance with personal care: Secondary | ICD-10-CM | POA: Diagnosis not present

## 2022-05-04 DIAGNOSIS — Z880 Allergy status to penicillin: Secondary | ICD-10-CM

## 2022-05-04 DIAGNOSIS — M1990 Unspecified osteoarthritis, unspecified site: Secondary | ICD-10-CM | POA: Diagnosis not present

## 2022-05-04 DIAGNOSIS — R1084 Generalized abdominal pain: Secondary | ICD-10-CM | POA: Diagnosis not present

## 2022-05-04 DIAGNOSIS — K219 Gastro-esophageal reflux disease without esophagitis: Secondary | ICD-10-CM | POA: Diagnosis not present

## 2022-05-04 DIAGNOSIS — E876 Hypokalemia: Secondary | ICD-10-CM | POA: Diagnosis not present

## 2022-05-04 DIAGNOSIS — R41841 Cognitive communication deficit: Secondary | ICD-10-CM | POA: Diagnosis not present

## 2022-05-04 DIAGNOSIS — N3020 Other chronic cystitis without hematuria: Secondary | ICD-10-CM | POA: Diagnosis not present

## 2022-05-04 DIAGNOSIS — R109 Unspecified abdominal pain: Secondary | ICD-10-CM | POA: Diagnosis not present

## 2022-05-04 DIAGNOSIS — D649 Anemia, unspecified: Secondary | ICD-10-CM | POA: Diagnosis not present

## 2022-05-04 DIAGNOSIS — Z90710 Acquired absence of both cervix and uterus: Secondary | ICD-10-CM

## 2022-05-04 DIAGNOSIS — I10 Essential (primary) hypertension: Secondary | ICD-10-CM | POA: Diagnosis present

## 2022-05-04 DIAGNOSIS — R5381 Other malaise: Secondary | ICD-10-CM | POA: Diagnosis present

## 2022-05-04 DIAGNOSIS — K5900 Constipation, unspecified: Secondary | ICD-10-CM | POA: Diagnosis not present

## 2022-05-04 DIAGNOSIS — R2681 Unsteadiness on feet: Secondary | ICD-10-CM | POA: Diagnosis not present

## 2022-05-04 DIAGNOSIS — K921 Melena: Secondary | ICD-10-CM | POA: Diagnosis not present

## 2022-05-04 DIAGNOSIS — R197 Diarrhea, unspecified: Secondary | ICD-10-CM | POA: Diagnosis not present

## 2022-05-04 DIAGNOSIS — R1312 Dysphagia, oropharyngeal phase: Secondary | ICD-10-CM | POA: Diagnosis not present

## 2022-05-04 DIAGNOSIS — R195 Other fecal abnormalities: Secondary | ICD-10-CM

## 2022-05-04 DIAGNOSIS — Z91013 Allergy to seafood: Secondary | ICD-10-CM

## 2022-05-04 DIAGNOSIS — Z79899 Other long term (current) drug therapy: Secondary | ICD-10-CM | POA: Diagnosis not present

## 2022-05-04 DIAGNOSIS — Z91011 Allergy to milk products: Secondary | ICD-10-CM

## 2022-05-04 DIAGNOSIS — Z882 Allergy status to sulfonamides status: Secondary | ICD-10-CM

## 2022-05-04 DIAGNOSIS — D509 Iron deficiency anemia, unspecified: Secondary | ICD-10-CM

## 2022-05-04 DIAGNOSIS — R110 Nausea: Secondary | ICD-10-CM | POA: Diagnosis not present

## 2022-05-04 DIAGNOSIS — Z91041 Radiographic dye allergy status: Secondary | ICD-10-CM

## 2022-05-04 DIAGNOSIS — Z885 Allergy status to narcotic agent status: Secondary | ICD-10-CM

## 2022-05-04 DIAGNOSIS — R0902 Hypoxemia: Secondary | ICD-10-CM | POA: Diagnosis not present

## 2022-05-04 LAB — CBC WITH DIFFERENTIAL/PLATELET
Abs Immature Granulocytes: 0.07 10*3/uL (ref 0.00–0.07)
Basophils Absolute: 0.1 10*3/uL (ref 0.0–0.1)
Basophils Relative: 1 %
Eosinophils Absolute: 0.1 10*3/uL (ref 0.0–0.5)
Eosinophils Relative: 1 %
HCT: 26.7 % — ABNORMAL LOW (ref 36.0–46.0)
Hemoglobin: 8.7 g/dL — ABNORMAL LOW (ref 12.0–15.0)
Immature Granulocytes: 1 %
Lymphocytes Relative: 20 %
Lymphs Abs: 2.2 10*3/uL (ref 0.7–4.0)
MCH: 28 pg (ref 26.0–34.0)
MCHC: 32.6 g/dL (ref 30.0–36.0)
MCV: 85.9 fL (ref 80.0–100.0)
Monocytes Absolute: 1.1 10*3/uL — ABNORMAL HIGH (ref 0.1–1.0)
Monocytes Relative: 10 %
Neutro Abs: 7.4 10*3/uL (ref 1.7–7.7)
Neutrophils Relative %: 67 %
Platelets: 532 10*3/uL — ABNORMAL HIGH (ref 150–400)
RBC: 3.11 MIL/uL — ABNORMAL LOW (ref 3.87–5.11)
RDW: 15.5 % (ref 11.5–15.5)
WBC: 10.9 10*3/uL — ABNORMAL HIGH (ref 4.0–10.5)
nRBC: 0 % (ref 0.0–0.2)

## 2022-05-04 LAB — COMPREHENSIVE METABOLIC PANEL
ALT: 33 U/L (ref 0–44)
AST: 39 U/L (ref 15–41)
Albumin: 2.3 g/dL — ABNORMAL LOW (ref 3.5–5.0)
Alkaline Phosphatase: 132 U/L — ABNORMAL HIGH (ref 38–126)
Anion gap: 9 (ref 5–15)
BUN: 11 mg/dL (ref 8–23)
CO2: 24 mmol/L (ref 22–32)
Calcium: 8.5 mg/dL — ABNORMAL LOW (ref 8.9–10.3)
Chloride: 94 mmol/L — ABNORMAL LOW (ref 98–111)
Creatinine, Ser: 0.63 mg/dL (ref 0.44–1.00)
GFR, Estimated: >60 mL/min (ref >60)
Glucose, Bld: 121 mg/dL — ABNORMAL HIGH (ref 70–99)
Potassium: 3.2 mmol/L — ABNORMAL LOW (ref 3.5–5.1)
Sodium: 127 mmol/L — ABNORMAL LOW (ref 135–145)
Total Bilirubin: 0.8 mg/dL (ref 0.3–1.2)
Total Protein: 6.5 g/dL (ref 6.5–8.1)

## 2022-05-04 LAB — LIPASE, BLOOD: Lipase: 32 U/L (ref 11–51)

## 2022-05-04 MED ORDER — DIPHENHYDRAMINE HCL 50 MG/ML IJ SOLN
50.0000 mg | Freq: Once | INTRAMUSCULAR | Status: AC
  Administered 2022-05-05: 50 mg via INTRAVENOUS
  Filled 2022-05-04: qty 1

## 2022-05-04 MED ORDER — SODIUM CHLORIDE 0.9 % IV BOLUS
500.0000 mL | Freq: Once | INTRAVENOUS | Status: AC
  Administered 2022-05-04: 500 mL via INTRAVENOUS

## 2022-05-04 MED ORDER — METHYLPREDNISOLONE SODIUM SUCC 40 MG IJ SOLR
40.0000 mg | Freq: Once | INTRAMUSCULAR | Status: AC
  Administered 2022-05-04: 40 mg via INTRAVENOUS
  Filled 2022-05-04: qty 1

## 2022-05-04 MED ORDER — PANTOPRAZOLE SODIUM 40 MG IV SOLR
40.0000 mg | Freq: Once | INTRAVENOUS | Status: AC
  Administered 2022-05-04: 40 mg via INTRAVENOUS
  Filled 2022-05-04: qty 10

## 2022-05-04 MED ORDER — DIPHENHYDRAMINE HCL 25 MG PO CAPS: 50.0000 mg | ORAL_CAPSULE | Freq: Once | ORAL | Status: DC

## 2022-05-04 MED ORDER — POTASSIUM CHLORIDE CRYS ER 20 MEQ PO TBCR
40.0000 meq | EXTENDED_RELEASE_TABLET | Freq: Once | ORAL | Status: AC
  Administered 2022-05-04: 40 meq via ORAL
  Filled 2022-05-04: qty 2

## 2022-05-04 NOTE — ED Notes (Addendum)
Pt incontinent of bowel and bladder Pt cleaned and pure-wick placed at this time.

## 2022-05-04 NOTE — ED Provider Notes (Signed)
[facility] EMERGENCY DEPARTMENT Provider Note   CSN: [id number] Arrival date & time: 05/04/22  1831     History  Chief Complaint  Patient presents with   Emesis    Pamela Reed is a 88 y.o. female.  Patient presents to ER chief complaint of abdominal pain nausea vomiting and diarrhea.  Symptoms started today.  She had recently been diagnosed with urinary tract infection a week ago and finished a course of antibiotics and developed abdominal pain and diarrhea in the last 24 hours.  Denies fevers denies cough denies headache or chest pain.       Home Medications Prior to Admission medications   Medication Sig Start Date End Date Taking? Authorizing Provider  acetaminophen (TYLENOL) 500 MG tablet Take by mouth. 04/29/22  Yes [provider]  B Complex Vitamins (VITAMIN B-COMPLEX) TABS Take by mouth. 01/13/13  Yes [provider]  Calcium Carb-Cholecalciferol 600-10 MG-MCG TABS Take by mouth. 01/13/13  Yes [provider]  ferrous sulfate 325 (65 FE) MG tablet Take by mouth. 04/25/22 04/25/23 Yes [provider]  fluticasone (FLONASE) 50 MCG/ACT nasal spray 2 sprays into each nostril daily. 12/24/21  Yes [provider]  levothyroxine (SYNTHROID) 25 MCG tablet Take by mouth. 12/16/12  Yes [provider]  ondansetron (ZOFRAN-ODT) 4 MG disintegrating tablet Take by mouth. 04/29/22 05/29/22 Yes [provider]  simvastatin (ZOCOR) 5 MG tablet Take by mouth. 03/13/22  Yes [provider]  albuterol (VENTOLIN HFA) 108 (90 Base) MCG/ACT inhaler Inhale 2 puffs into the lungs every 6 (six) hours as needed. Patient not taking: Reported on 04/12/2022 05/17/20   [provider]  ALPRAZolam Prudy Feeler) 0.25 MG tablet Take 0.25 mg by mouth 2 (two) times daily as needed. 03/07/21   [provider]  amLODipine (NORVASC) 10 MG tablet Take 10 mg by mouth daily. 04/24/22   [provider]  aspirin 81 MG tablet Take 81 mg  by mouth daily.    [provider]  FLUoxetine (PROZAC) 10 MG tablet Take 10 mg by mouth daily.     [provider]  Fluoxetine HCl, PMDD, 10 MG TABS Take 1 tablet by mouth daily.    [provider]  fluticasone (FLONASE) 50 MCG/ACT nasal spray Place 2 sprays into both nostrils daily. 03/31/22   [provider]  furosemide (LASIX) 20 MG tablet Take 1 tablet (20 mg total) by mouth daily. Please hold until diarrhea completely resolved 04/14/22   Arnetha Courser, MD  levothyroxine (SYNTHROID, LEVOTHROID) 50 MCG tablet Take 50 mcg by mouth daily before breakfast.    [provider]  lisinopril (PRINIVIL,ZESTRIL) 20 MG tablet Take 20 mg by mouth 2 (two) times daily.     [provider]  lisinopril (ZESTRIL) 20 MG tablet Take by mouth.    [provider]  Magnesium 500 MG TABS Take 500 mg by mouth daily. Patient not taking: Reported on 04/12/2022    [provider]  metoprolol succinate (TOPROL-XL) 25 MG 24 hr tablet Take 25 mg by mouth 2 (two) times daily.     [provider]  Multiple Vitamin (MULTIVITAMIN WITH MINERALS) TABS tablet Take 1 tablet by mouth daily. 04/15/22   Arnetha Courser, MD  Netarsudil Dimesylate (RHOPRESSA) 0.02 % SOLN Place 1 drop into both eyes at bedtime.    [provider]  omeprazole (PRILOSEC OTC) 20 MG tablet Take 20 mg by mouth daily as needed (heartburn).    [provider]  simvastatin (  ZOCOR) 5 MG tablet Take 5 mg by mouth at bedtime. 03/13/22   [provider]  SYMBICORT 80-4.5 MCG/ACT inhaler Inhale into the lungs. 02/24/21   [provider]  VYZULTA 0.024 % SOLN Place 1 drop into both eyes at bedtime.     [provider]      Allergies    Contrast media [iodinated contrast media], Iodine, Sulfasalazine, Dye fdc red [red dye], Lactose intolerance (gi), Penicillins, Shellfish allergy, Sulfa antibiotics, Venlafaxine, Codeine, and Ropinirole    Review of  Systems   Review of Systems  Constitutional:  Negative for fever.  HENT:  Negative for ear pain.   Eyes:  Negative for pain.  Respiratory:  Negative for cough.   Cardiovascular:  Negative for chest pain.  Gastrointestinal:  Positive for abdominal pain.  Genitourinary:  Negative for flank pain.  Musculoskeletal:  Negative for back pain.  Skin:  Negative for rash.  Neurological:  Negative for headaches.    Physical Exam Updated Vital Signs BP (!) 120/54   Pulse 87   Temp 97.8 F (36.6 C) (Rectal)   Resp 20   Ht 5\' 2"  (1.575 m)   Wt 77.1 kg   SpO2 93%   BMI 31.09 kg/m  Physical Exam Constitutional:      General: She is not in acute distress.    Appearance: Normal appearance.  HENT:     Head: Normocephalic.     Nose: Nose normal.  Eyes:     Extraocular Movements: Extraocular movements intact.  Cardiovascular:     Rate and Rhythm: Normal rate.  Pulmonary:     Effort: Pulmonary effort is normal.  Abdominal:     Comments: Mild diffuse abdominal tenderness on exam no guarding or rebound.  Musculoskeletal:        General: Normal range of motion.     Cervical back: Normal range of motion.  Neurological:     General: No focal deficit present.     Mental Status: She is alert. Mental status is at baseline.     ED Results / Procedures / Treatments   Labs (all labs ordered are listed, but only abnormal results are displayed) Labs Reviewed  CBC WITH DIFFERENTIAL/PLATELET - Abnormal; Notable for the following components:      Result Value   WBC 10.9 (*)    RBC 3.11 (*)    Hemoglobin 8.7 (*)    HCT 26.7 (*)    Platelets 532 (*)    Monocytes Absolute 1.1 (*)    All other components within normal limits  COMPREHENSIVE METABOLIC PANEL - Abnormal; Notable for the following components:   Sodium 127 (*)    Potassium 3.2 (*)    Chloride 94 (*)    Glucose, Bld 121 (*)    Calcium 8.5 (*)    Albumin 2.3 (*)    Alkaline Phosphatase 132 (*)    All other components within  normal limits  URINE CULTURE  LIPASE, BLOOD  URINALYSIS, ROUTINE W REFLEX MICROSCOPIC    EKG None  Radiology No results found.  Procedures Procedures    Medications Ordered in ED Medications  diphenhydrAMINE (BENADRYL) injection 50 mg (has no administration in time range)  potassium chloride SA (KLOR-CON M) CR tablet 40 mEq (has no administration in time range)  pantoprazole (PROTONIX) injection 40 mg (has no administration in time range)  sodium chloride 0.9 % bolus 500 mL (0 mLs Intravenous Stopped 05/04/22 2028)  methylPREDNISolone sodium succinate (SOLU-MEDROL) 40 mg/mL injection 40 mg (40 mg  Intravenous Given 05/04/22 2044)    ED Course/ Medical Decision Making/ A&P                           Medical Decision Making Amount and/or Complexity of Data Reviewed Labs: ordered. Radiology: ordered.  Risk Prescription drug management.   Review of external records show admission Apr 14, 2022 for weakness diverticulitis GI hemorrhage with melena.  She had an EGD done May 30 that was unremarkable per records patient was ultimately discharged home.  Cardiac monitoring showing sinus rhythm.  History obtained from family at bedside.  Labs are sent white count 11 hemoglobin 8.7, chemistry showing mild hypokalemia.  Hemoglobin appears to trending downwards from a baseline of 11-12 now 8.7 within the course of last month.  Given recent history of GI bleed I suspect recurrence.  Rectal exam shows brown stool with this is guaiac positive however.  Patient has a severe allergy to contrast as well and pending CT abdomen pelvis with IV contrast.  Admission to the medical team for persistent anemia and abdominal pain and GI bleed.  Consultation with Dr.Carver from gastroenterology, recommends n.p.o. after midnight and close follow-up of the CT scan.          Final Clinical Impression(s) / ED Diagnoses Final diagnoses:  Generalized abdominal pain  Gastrointestinal  hemorrhage, unspecified gastrointestinal hemorrhage type  Anemia, unspecified type    Rx / DC Orders ED Discharge Orders     None         Cheryll Cockayne, MD 05/04/22 2316

## 2022-05-04 NOTE — ED Triage Notes (Signed)
Patient via EMS from [facility] for nausea and diarrhea that started today.

## 2022-05-04 NOTE — ED Notes (Signed)
Pts rectal temp was 97.8

## 2022-05-05 ENCOUNTER — Encounter (HOSPITAL_COMMUNITY)
Admission: EM | Disposition: A | Discharge status: Skilled Nursing Facility | Payer: Self-pay | Source: Home / Self Care | Attending: Internal Medicine

## 2022-05-05 ENCOUNTER — Encounter (HOSPITAL_COMMUNITY): Payer: Self-pay | Admitting: Family Medicine

## 2022-05-05 ENCOUNTER — Inpatient Hospital Stay (HOSPITAL_COMMUNITY): Payer: Medicare HMO | Admitting: Anesthesiology

## 2022-05-05 ENCOUNTER — Inpatient Hospital Stay (HOSPITAL_COMMUNITY): Payer: Medicare HMO

## 2022-05-05 DIAGNOSIS — E039 Hypothyroidism, unspecified: Secondary | ICD-10-CM

## 2022-05-05 DIAGNOSIS — E871 Hypo-osmolality and hyponatremia: Secondary | ICD-10-CM | POA: Diagnosis not present

## 2022-05-05 DIAGNOSIS — E440 Moderate protein-calorie malnutrition: Secondary | ICD-10-CM

## 2022-05-05 DIAGNOSIS — K449 Diaphragmatic hernia without obstruction or gangrene: Secondary | ICD-10-CM

## 2022-05-05 DIAGNOSIS — D75839 Thrombocytosis, unspecified: Secondary | ICD-10-CM

## 2022-05-05 DIAGNOSIS — K922 Gastrointestinal hemorrhage, unspecified: Secondary | ICD-10-CM

## 2022-05-05 DIAGNOSIS — F419 Anxiety disorder, unspecified: Secondary | ICD-10-CM | POA: Diagnosis not present

## 2022-05-05 DIAGNOSIS — I10 Essential (primary) hypertension: Secondary | ICD-10-CM

## 2022-05-05 DIAGNOSIS — E876 Hypokalemia: Secondary | ICD-10-CM | POA: Diagnosis not present

## 2022-05-05 DIAGNOSIS — D509 Iron deficiency anemia, unspecified: Secondary | ICD-10-CM

## 2022-05-05 DIAGNOSIS — K259 Gastric ulcer, unspecified as acute or chronic, without hemorrhage or perforation: Secondary | ICD-10-CM

## 2022-05-05 DIAGNOSIS — R5381 Other malaise: Secondary | ICD-10-CM

## 2022-05-05 HISTORY — PX: GIVENS CAPSULE STUDY: SHX5432

## 2022-05-05 HISTORY — PX: ESOPHAGOGASTRODUODENOSCOPY (EGD) WITH PROPOFOL: SHX5813

## 2022-05-05 LAB — COMPREHENSIVE METABOLIC PANEL
ALT: 34 U/L (ref 0–44)
AST: 37 U/L (ref 15–41)
Albumin: 2.4 g/dL — ABNORMAL LOW (ref 3.5–5.0)
Alkaline Phosphatase: 128 U/L — ABNORMAL HIGH (ref 38–126)
Anion gap: 10 (ref 5–15)
BUN: 10 mg/dL (ref 8–23)
CO2: 23 mmol/L (ref 22–32)
Calcium: 8.7 mg/dL — ABNORMAL LOW (ref 8.9–10.3)
Chloride: 98 mmol/L (ref 98–111)
Creatinine, Ser: 0.58 mg/dL (ref 0.44–1.00)
GFR, Estimated: >60 mL/min (ref >60)
Glucose, Bld: 177 mg/dL — ABNORMAL HIGH (ref 70–99)
Potassium: 4.1 mmol/L (ref 3.5–5.1)
Sodium: 131 mmol/L — ABNORMAL LOW (ref 135–145)
Total Bilirubin: 0.7 mg/dL (ref 0.3–1.2)
Total Protein: 6.6 g/dL (ref 6.5–8.1)

## 2022-05-05 LAB — URINALYSIS, ROUTINE W REFLEX MICROSCOPIC
Bilirubin Urine: NEGATIVE
Glucose, UA: NEGATIVE mg/dL
Ketones, ur: NEGATIVE mg/dL
Nitrite: NEGATIVE
Protein, ur: NEGATIVE mg/dL
Specific Gravity, Urine: 1.008 (ref 1.005–1.030)
pH: 7 (ref 5.0–8.0)

## 2022-05-05 LAB — FERRITIN: Ferritin: 429 ng/mL — ABNORMAL HIGH (ref 11–307)

## 2022-05-05 LAB — FOLATE: Folate: 8.2 ng/mL (ref >5.9)

## 2022-05-05 LAB — CBC
HCT: 26.7 % — ABNORMAL LOW (ref 36.0–46.0)
HCT: 26.7 % — ABNORMAL LOW (ref 36.0–46.0)
HCT: 24.5 % — ABNORMAL LOW (ref 36.0–46.0)
Hemoglobin: 8.5 g/dL — ABNORMAL LOW (ref 12.0–15.0)
Hemoglobin: 9.1 g/dL — ABNORMAL LOW (ref 12.0–15.0)
Hemoglobin: 7.8 g/dL — ABNORMAL LOW (ref 12.0–15.0)
MCH: 27.7 pg (ref 26.0–34.0)
MCH: 28.4 pg (ref 26.0–34.0)
MCH: 27.7 pg (ref 26.0–34.0)
MCHC: 31.8 g/dL (ref 30.0–36.0)
MCHC: 34.1 g/dL (ref 30.0–36.0)
MCHC: 31.8 g/dL (ref 30.0–36.0)
MCV: 87 fL (ref 80.0–100.0)
MCV: 83.4 fL (ref 80.0–100.0)
MCV: 86.9 fL (ref 80.0–100.0)
Platelets: 515 10*3/uL — ABNORMAL HIGH (ref 150–400)
Platelets: 581 10*3/uL — ABNORMAL HIGH (ref 150–400)
Platelets: 524 10*3/uL — ABNORMAL HIGH (ref 150–400)
RBC: 3.07 MIL/uL — ABNORMAL LOW (ref 3.87–5.11)
RBC: 3.2 MIL/uL — ABNORMAL LOW (ref 3.87–5.11)
RBC: 2.82 MIL/uL — ABNORMAL LOW (ref 3.87–5.11)
RDW: 15.3 % (ref 11.5–15.5)
RDW: 15.3 % (ref 11.5–15.5)
RDW: 15.4 % (ref 11.5–15.5)
WBC: 9.7 10*3/uL (ref 4.0–10.5)
WBC: 12.8 10*3/uL — ABNORMAL HIGH (ref 4.0–10.5)
WBC: 12.2 10*3/uL — ABNORMAL HIGH (ref 4.0–10.5)
nRBC: 0 % (ref 0.0–0.2)
nRBC: 0.2 % (ref 0.0–0.2)
nRBC: 0 % (ref 0.0–0.2)

## 2022-05-05 LAB — RETICULOCYTES
Immature Retic Fract: 18.1 % — ABNORMAL HIGH (ref 2.3–15.9)
RBC.: 3.04 MIL/uL — ABNORMAL LOW (ref 3.87–5.11)
Retic Count, Absolute: 75.4 10*3/uL (ref 19.0–186.0)
Retic Ct Pct: 2.5 % (ref 0.4–3.1)

## 2022-05-05 LAB — MAGNESIUM: Magnesium: 2 mg/dL (ref 1.7–2.4)

## 2022-05-05 LAB — IRON AND TIBC
Iron: 25 ug/dL — ABNORMAL LOW (ref 28–170)
Saturation Ratios: 12 % (ref 10.4–31.8)
TIBC: 212 ug/dL — ABNORMAL LOW (ref 250–450)
UIBC: 187 ug/dL

## 2022-05-05 LAB — VITAMIN B12: Vitamin B-12: 513 pg/mL (ref 180–914)

## 2022-05-05 LAB — PROCALCITONIN: Procalcitonin: <0.1 ng/mL

## 2022-05-05 SURGERY — ESOPHAGOGASTRODUODENOSCOPY (EGD) WITH PROPOFOL
Anesthesia: General

## 2022-05-05 MED ORDER — FLUOXETINE HCL 10 MG PO CAPS
10.0000 mg | ORAL_CAPSULE | Freq: Every day | ORAL | Status: DC
  Administered 2022-05-05 – 2022-05-12 (×8): 10 mg via ORAL
  Filled 2022-05-05 (×8): qty 1

## 2022-05-05 MED ORDER — IOHEXOL 300 MG/ML  SOLN
75.0000 mL | Freq: Once | INTRAMUSCULAR | Status: AC | PRN
  Administered 2022-05-05: 75 mL via INTRAVENOUS

## 2022-05-05 MED ORDER — SODIUM CHLORIDE 0.9 % IV SOLN: INTRAVENOUS | Status: DC

## 2022-05-05 MED ORDER — OXYCODONE HCL 5 MG PO TABS: 5.0000 mg | ORAL_TABLET | ORAL | Status: DC | PRN

## 2022-05-05 MED ORDER — SIMVASTATIN 10 MG PO TABS
5.0000 mg | ORAL_TABLET | Freq: Every day | ORAL | Status: DC
  Administered 2022-05-05 – 2022-05-11 (×7): 5 mg via ORAL
  Filled 2022-05-05 (×7): qty 1

## 2022-05-05 MED ORDER — LACTATED RINGERS IV SOLN: INTRAVENOUS | Status: DC

## 2022-05-05 MED ORDER — AMLODIPINE BESYLATE 5 MG PO TABS
10.0000 mg | ORAL_TABLET | Freq: Every day | ORAL | Status: DC
  Administered 2022-05-05 – 2022-05-12 (×8): 10 mg via ORAL
  Filled 2022-05-05 (×8): qty 2

## 2022-05-05 MED ORDER — ZINC OXIDE 40 % EX PSTE
PASTE | Freq: Three times a day (TID) | CUTANEOUS | Status: DC | PRN
Start: 2022-05-05 — End: 2022-05-12
  Filled 2022-05-05 (×3): qty 57

## 2022-05-05 MED ORDER — LISINOPRIL 10 MG PO TABS
20.0000 mg | ORAL_TABLET | Freq: Two times a day (BID) | ORAL | Status: DC
  Administered 2022-05-05 – 2022-05-12 (×13): 20 mg via ORAL
  Filled 2022-05-05 (×15): qty 2

## 2022-05-05 MED ORDER — PROPOFOL 10 MG/ML IV BOLUS
INTRAVENOUS | Status: DC | PRN
  Administered 2022-05-05: 20 mg via INTRAVENOUS
  Administered 2022-05-05: 80 mg via INTRAVENOUS
  Administered 2022-05-05: 30 mg via INTRAVENOUS

## 2022-05-05 MED ORDER — METOPROLOL SUCCINATE ER 25 MG PO TB24
25.0000 mg | ORAL_TABLET | Freq: Two times a day (BID) | ORAL | Status: DC
  Administered 2022-05-05 – 2022-05-12 (×14): 25 mg via ORAL
  Filled 2022-05-05 (×16): qty 1

## 2022-05-05 MED ORDER — MOMETASONE FURO-FORMOTEROL FUM 100-5 MCG/ACT IN AERO
2.0000 | INHALATION_SPRAY | Freq: Two times a day (BID) | RESPIRATORY_TRACT | Status: DC
  Administered 2022-05-05 – 2022-05-12 (×15): 2 via RESPIRATORY_TRACT
  Filled 2022-05-05: qty 8.8

## 2022-05-05 MED ORDER — LIDOCAINE HCL (CARDIAC) PF 50 MG/5ML IV SOSY
PREFILLED_SYRINGE | INTRAVENOUS | Status: DC | PRN
  Administered 2022-05-05: 60 mg via INTRAVENOUS

## 2022-05-05 MED ORDER — ALPRAZOLAM 0.25 MG PO TABS
0.2500 mg | ORAL_TABLET | Freq: Two times a day (BID) | ORAL | Status: DC | PRN
Start: 2022-05-05 — End: 2022-05-12
  Administered 2022-05-08: 0.25 mg via ORAL
  Filled 2022-05-05: qty 1

## 2022-05-05 MED ORDER — ACETAMINOPHEN 650 MG RE SUPP: 650.0000 mg | Freq: Four times a day (QID) | RECTAL | Status: DC | PRN

## 2022-05-05 MED ORDER — ACETAMINOPHEN 325 MG PO TABS
650.0000 mg | ORAL_TABLET | Freq: Four times a day (QID) | ORAL | Status: DC | PRN
  Administered 2022-05-05: 650 mg via ORAL
  Filled 2022-05-05: qty 2

## 2022-05-05 MED ORDER — MORPHINE SULFATE (PF) 2 MG/ML IV SOLN: 2.0000 mg | INTRAVENOUS | Status: DC | PRN

## 2022-05-05 MED ORDER — PANTOPRAZOLE SODIUM 40 MG IV SOLR
40.0000 mg | Freq: Two times a day (BID) | INTRAVENOUS | Status: DC
  Administered 2022-05-05 – 2022-05-09 (×9): 40 mg via INTRAVENOUS
  Filled 2022-05-05 (×9): qty 10

## 2022-05-05 MED ORDER — ONDANSETRON HCL 4 MG/2ML IJ SOLN
4.0000 mg | Freq: Four times a day (QID) | INTRAMUSCULAR | Status: DC | PRN
  Administered 2022-05-06: 4 mg via INTRAVENOUS
  Filled 2022-05-05: qty 2

## 2022-05-05 MED ORDER — HYDROMORPHONE HCL 1 MG/ML IJ SOLN: 0.2500 mg | INTRAMUSCULAR | Status: DC | PRN

## 2022-05-05 MED ORDER — LEVOTHYROXINE SODIUM 50 MCG PO TABS
50.0000 ug | ORAL_TABLET | Freq: Every day | ORAL | Status: DC
  Administered 2022-05-05 – 2022-05-12 (×8): 50 ug via ORAL
  Filled 2022-05-05 (×8): qty 1

## 2022-05-05 MED ORDER — ONDANSETRON HCL 4 MG PO TABS: 4.0000 mg | ORAL_TABLET | Freq: Four times a day (QID) | ORAL | Status: DC | PRN

## 2022-05-05 NOTE — Brief Op Note (Signed)
05/04/2022 - 05/05/2022  12:04 PM  PATIENT:  Pamela Reed  88 y.o. female  PRE-OPERATIVE DIAGNOSIS:  anemia, heme positive stool  POST-OPERATIVE DIAGNOSIS:  hiatal hernia (37cm/33cm), cameron erosions, givens capsule placed at 1158  PROCEDURE:  Procedure(s): ESOPHAGOGASTRODUODENOSCOPY (EGD) WITH PROPOFOL (N/A) GIVENS CAPSULE STUDY (N/A)  SURGEON:  Surgeon(s) and Role: Panel 1:    * Dolores Frame, MD - Primary  Patient underwent EGD under propofol sedation.  Tolerated the procedure adequately.  Esophagus showed a 4 cm hiatal hernia was present.  This was a Hill Grade IV hernia. The entire examined stomach was normal. The examined duodenum up to the third portion was normal.  Endoscopic capsule was successfully deployed in the duodenal bulb.  RECOMMENDATIONS - Return patient to hospital ward for ongoing care.  - Check H/H every day. - Follow up capsule endoscopy results.  Katrinka Blazing, MD Gastroenterology and Hepatology [facility]

## 2022-05-05 NOTE — Assessment & Plan Note (Addendum)
-   Acute phase reactant and iron deficiency -Low suspicion for infection at this time -CT abdomen pelvis does not show signs of infection -No leukocytosis -improving with Fe replacement

## 2022-05-05 NOTE — Anesthesia Preprocedure Evaluation (Signed)
Anesthesia Evaluation  Patient identified by MRN, date of birth, ID band Patient awake    Reviewed: Allergy & Precautions, H&P , NPO status , Patient's Chart, lab work & pertinent test results, reviewed documented beta blocker date and time   Airway Mallampati: II  TM Distance: >3 FB Neck ROM: full    Dental no notable dental hx.    Pulmonary COPD,    Pulmonary exam normal breath sounds clear to auscultation       Cardiovascular Exercise Tolerance: Good hypertension, negative cardio ROS   Rhythm:regular Rate:Normal     Neuro/Psych  Headaches, PSYCHIATRIC DISORDERS Anxiety Depression    GI/Hepatic Neg liver ROS, GERD  Medicated,  Endo/Other  Hypothyroidism   Renal/GU negative Renal ROS  negative genitourinary   Musculoskeletal   Abdominal   Peds  Hematology negative hematology ROS (+)   Anesthesia Other Findings   Reproductive/Obstetrics negative OB ROS                             Anesthesia Physical Anesthesia Plan  ASA: 4 and emergent  Anesthesia Plan: General   Post-op Pain Management:    Induction:   PONV Risk Score and Plan: Propofol infusion  Airway Management Planned:   Additional Equipment:   Intra-op Plan:   Post-operative Plan:   Informed Consent: I have reviewed the patients History and Physical, chart, labs and discussed the procedure including the risks, benefits and alternatives for the proposed anesthesia with the patient or authorized representative who has indicated his/her understanding and acceptance.     Dental Advisory Given  Plan Discussed with: CRNA  Anesthesia Plan Comments:         Anesthesia Quick Evaluation

## 2022-05-05 NOTE — Assessment & Plan Note (Addendum)
-   Continue normal saline 75 mill per hour>>saline lock -This has been a gradual trend down from the end of May due to volume depletion -now improved with IVF and improved po intake - Na 136 on day of d/c

## 2022-05-05 NOTE — Transfer of Care (Signed)
Immediate Anesthesia Transfer of Care Note  Patient: ELDA DUNKERSON  Procedure(s) Performed: ESOPHAGOGASTRODUODENOSCOPY (EGD) WITH PROPOFOL GIVENS CAPSULE STUDY  Patient Location: PACU  Anesthesia Type:General  Level of Consciousness: sedated and patient cooperative  Airway & Oxygen Therapy: Patient Spontanous Breathing  Post-op Assessment: Report given to RN and Post -op Vital signs reviewed and stable  Post vital signs: Reviewed and stable  Last Vitals:  Vitals Value Taken Time  BP 98/51 05/05/22 1205  Temp 97.6 1205  Pulse 103 05/05/22 1206  Resp 13 05/05/22 1206  SpO2 98 % 05/05/22 1206  Vitals shown include unvalidated device data.  Last Pain:  Vitals:   05/05/22 1142  TempSrc:   PainSc: 0-No pain      Patients Stated Pain Goal: 3 (05/05/22 1025)  Complications: No notable events documented.

## 2022-05-05 NOTE — Consult Note (Signed)
Gastroenterology Consult   Referring Provider: Dr. Norman Clay  Primary Care Physician:  Alan Mulder, MD Primary Gastroenterologist:  previously established at [facility]  Patient ID: Pamela Reed; [id number]; 09-05-1931   Admit date: 05/04/2022  LOS: 1 day   Date of Consultation: 05/05/2022  Reason for Consultation:  Concern for GI bleed  History of Present Illness   Pamela Reed is an 88 y.o. year old female with history of sigmoid diverticulitis in May 2023, hospitalized at [facility] for diverticulitis May 20-23 s/p treatment with Cipro and Flagyl and discharged to SNF. She then presented to [facility] with concerns for GI bleeding (dark stool, drop in Hgb). EGD completed at [facility] on 04/21/22 with mildly torturous esophagus, 7 cm hiatal hernia, otherwise normal stomach and duodenum. She has been a resident at the [facility] since that time.    Patient presented via EMS yesterday evening for complaints of nausea, frequent stools, fatigue, weakness. She denies any black stool; however, she was heme positive in the ED. Admitting Hgb 8.7 yesterday and today 8.5. Discharge Hgb from [facility] a week ago was 8.9. No transfusions that I can see at [facility], as her Hgb stayed stable at that time. However, her Hgb was in the 11/12 range in May 2023.   CT today with sigmoid diverticulosis, moderate-sized hiatal hernia, no acute findings.   She notes onset of black stools in May 2023, prior to first hospitalization at [facility]. Daughter is present with her today. Noted frequent stools at that time. Stool was like black oil in May 2023. She does not believe she has had any recent black stool. Main complaints of fatigue, weakness, nausea, difficulty controlling urine and bowel movements. Stool is formed and mixed with water. Manson Passey today. Formed stool this morning, bristol stool scale #4. Feels nauseated with eating/drinking. No appetite. Appetite has been poor for several months. Lower abdomen  feels irritated. LLQ feels irritated. Has lactose intolerance. No significant weight loss. States she stays around mid 160s to 170  Denies any NSAIDs. Takes PPI daily as outpatient per patient. Difficulty with larger pills, having to crush or divide and mix with applesauce. Unknown if taking oral iron or not as an outpatient but she does believe she was taking it prior to first admission. Does not feel she can tolerate any colon prep. She does not feel she can swallow a capsule.   She is primarily bed bound now. Daughter is at bedside and states patient has significantly declined since hospitalization for diverticulitis. Prior to this, she lived independently.    Prior endoscopic evaluation: EGD Apr 21, 2022: mildly torturous esophagus, 7 cm hiatal hernia, otherwise normal stomach and duodenum.  EGD June 2022: non-obstructing Schatzki ring s/p dilation, 6 cm hiatal hernia, normal stomach, normal duodenum Colonoscopy 2012-diverticulosis sigmoid. Internal hemorrhoids. Per Epic documentation. [facility], Dr. Manfred Shirts.    Past Medical History:  Diagnosis Date   Arthritis    spine   Cervicalgia    Chronic cystitis    COPD (chronic obstructive pulmonary disease) (HCC)    Deafness in left ear    s/p skull fracture - age 36   GERD (gastroesophageal reflux disease)    Headache    daily. 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   Hypertension    Hypothyroidism    Mixed incontinence urge and stress    Vertigo  Wears hearing aid    right ear    Past Surgical History:  Procedure Laterality Date   ABDOMINAL HYSTERECTOMY     CATARACT EXTRACTION W/ INTRAOCULAR LENS IMPLANT     CHOLECYSTECTOMY     COLONOSCOPY     ESOPHAGOGASTRODUODENOSCOPY  04/21/2022   [place]: mildly torturous esophagus, 7 cm hiatal hernia, oetherwise normal stomach and duodenum   ESOPHAGOGASTRODUODENOSCOPY (EGD) WITH PROPOFOL N/A 05/12/2021   non-obstructing  Schatzki ring s/p dilation, 6 cm hiatal hernia, normal stomach, normal duodenum   EYE SURGERY Bilateral    eye shunts   FOOT SURGERY     GLAUCOMA SURGERY     PHOTOCOAGULATION WITH LASER Left 09/07/2016   Procedure: PHOTOCOAGULATION WITH LASER;  Surgeon: Sherald Hess, MD;  [facility];  Service: Ophthalmology;  Laterality: Left;  LEFT   TONSILLECTOMY      Prior to Admission medications   Medication Sig Start Date End Date Taking? Authorizing Provider  acetaminophen (TYLENOL) 500 MG tablet Take by mouth. 04/29/22  Yes [provider]  B Complex Vitamins (VITAMIN B-COMPLEX) TABS Take by mouth. 01/13/13  Yes [provider]  Calcium Carb-Cholecalciferol 600-10 MG-MCG TABS Take by mouth. 01/13/13  Yes [provider]  ferrous sulfate 325 (65 FE) MG tablet Take by mouth. 04/25/22 04/25/23 Yes [provider]  fluticasone (FLONASE) 50 MCG/ACT nasal spray 2 sprays into each nostril daily. 12/24/21  Yes [provider]  levothyroxine (SYNTHROID) 25 MCG tablet Take by mouth. 12/16/12  Yes [provider]  ondansetron (ZOFRAN-ODT) 4 MG disintegrating tablet Take by mouth. 04/29/22 05/29/22 Yes [provider]  simvastatin (ZOCOR) 5 MG tablet Take by mouth. 03/13/22  Yes [provider]  albuterol (VENTOLIN HFA) 108 (90 Base) MCG/ACT inhaler Inhale 2 puffs into the lungs every 6 (six) hours as needed. Patient not taking: Reported on 04/12/2022 05/17/20   [provider]  ALPRAZolam Prudy Feeler) 0.25 MG tablet Take 0.25 mg by mouth 2 (two) times daily as needed. 03/07/21   [provider]  amLODipine (NORVASC) 10 MG tablet Take 10 mg by mouth daily. 04/24/22   [provider]  aspirin 81 MG tablet Take 81 mg by mouth daily.    [provider]  FLUoxetine (PROZAC) 10 MG tablet Take 10 mg by mouth daily.     [provider]  Fluoxetine HCl, PMDD, 10 MG TABS Take 1 tablet by mouth daily.     [provider]  fluticasone (FLONASE) 50 MCG/ACT nasal spray Place 2 sprays into both nostrils daily. 03/31/22   [provider]  furosemide (LASIX) 20 MG tablet Take 1 tablet (20 mg total) by mouth daily. Please hold until diarrhea completely resolved 04/14/22   Arnetha Courser, MD  levothyroxine (SYNTHROID, LEVOTHROID) 50 MCG tablet Take 50 mcg by mouth daily before breakfast.    [provider]  lisinopril (PRINIVIL,ZESTRIL) 20 MG tablet Take 20 mg by mouth 2 (two) times daily.     [provider]  lisinopril (ZESTRIL) 20 MG tablet Take by mouth.    [provider]  Magnesium 500 MG TABS Take 500 mg by mouth daily. Patient not taking: Reported on 04/12/2022    [provider]  metoprolol succinate (TOPROL-XL) 25 MG 24 hr tablet Take 25 mg by mouth 2 (two) times daily.     [provider]  Multiple Vitamin (MULTIVITAMIN WITH MINERALS) TABS tablet Take 1 tablet by mouth daily. 04/15/22   Arnetha Courser, MD  Netarsudil Dimesylate (  RHOPRESSA) 0.02 % SOLN Place 1 drop into both eyes at bedtime.    [provider]  omeprazole (PRILOSEC OTC) 20 MG tablet Take 20 mg by mouth daily as needed (heartburn).    [provider]  simvastatin (ZOCOR) 5 MG tablet Take 5 mg by mouth at bedtime. 03/13/22   [provider]  SYMBICORT 80-4.5 MCG/ACT inhaler Inhale into the lungs. 02/24/21   [provider]  VYZULTA 0.024 % SOLN Place 1 drop into both eyes at bedtime.     [provider]    Current Facility-Administered Medications  Medication Dose Route Frequency Provider Last Rate Last Admin   0.9 %  sodium chloride infusion   Intravenous Continuous Zierle-Ghosh, Asia B, DO 75 mL/hr at 05/05/22 0407 New Bag at 05/05/22 0407   acetaminophen (TYLENOL) tablet 650 mg  650 mg Oral Q6H PRN Zierle-Ghosh, Asia B, DO       Or   acetaminophen (TYLENOL) suppository 650 mg  650 mg Rectal Q6H PRN Zierle-Ghosh, Asia B, DO        ALPRAZolam (XANAX) tablet 0.25 mg  0.25 mg Oral BID PRN Zierle-Ghosh, Asia B, DO       amLODipine (NORVASC) tablet 10 mg  10 mg Oral Daily Zierle-Ghosh, Asia B, DO   10 mg at 05/05/22 0840   FLUoxetine (PROZAC) capsule 10 mg  10 mg Oral Daily Zierle-Ghosh, Asia B, DO   10 mg at 05/05/22 0840   levothyroxine (SYNTHROID) tablet 50 mcg  50 mcg Oral Q0600 Zierle-Ghosh, Asia B, DO   50 mcg at 05/05/22 0408   lisinopril (ZESTRIL) tablet 20 mg  20 mg Oral BID Zierle-Ghosh, Asia B, DO   20 mg at 05/05/22 0840   metoprolol succinate (TOPROL-XL) 24 hr tablet 25 mg  25 mg Oral BID Zierle-Ghosh, Asia B, DO   25 mg at 05/05/22 0840   mometasone-formoterol (DULERA) 100-5 MCG/ACT inhaler 2 puff  2 puff Inhalation BID Zierle-Ghosh, Asia B, DO   2 puff at 05/05/22 0720   morphine (PF) 2 MG/ML injection 2 mg  2 mg Intravenous Q2H PRN Zierle-Ghosh, Asia B, DO       ondansetron (ZOFRAN) tablet 4 mg  4 mg Oral Q6H PRN Zierle-Ghosh, Asia B, DO       Or   ondansetron (ZOFRAN) injection 4 mg  4 mg Intravenous Q6H PRN Zierle-Ghosh, Asia B, DO       oxyCODONE (Oxy IR/ROXICODONE) immediate release tablet 5 mg  5 mg Oral Q4H PRN Zierle-Ghosh, Asia B, DO       pantoprazole (PROTONIX) injection 40 mg  40 mg Intravenous BID Zierle-Ghosh, Asia B, DO   40 mg at 05/05/22 0840   simvastatin (ZOCOR) tablet 5 mg  5 mg Oral QHS Zierle-Ghosh, Asia B, DO       Zinc Oxide 40 % PSTE   Topical TID PRN Vassie LollMadera, Carlos, MD        Allergies as of 05/04/2022 - Review Complete 05/04/2022  Allergen Reaction Noted   Contrast media [iodinated contrast media] Shortness Of Breath 07/24/2016   Iodine Shortness Of Breath 03/09/2013   Sulfasalazine Itching and Swelling 07/24/2016   Dye fdc red [red dye]  07/24/2016   Lactose intolerance (gi)  07/24/2016   Penicillins  07/24/2016   Shellfish allergy Nausea Only and Swelling 07/24/2016   Sulfa antibiotics Itching and Swelling 07/24/2016   Venlafaxine Swelling 07/24/2016   Codeine Palpitations  07/24/2016   Ropinirole Nausea Only 10/05/2017    Family History  Problem Relation Age of Onset   Colon polyps Neg Hx    Colon cancer Neg Hx     Social History   Socioeconomic History   Marital status: Single    Spouse name: Not on file   Number of children: Not on file   Years of education: Not on file   Highest education level: Not on file  Occupational History   Not on file  Tobacco Use   Smoking status: Never   Smokeless tobacco: Never  Vaping Use   Vaping Use: Never used  Substance and Sexual Activity   Alcohol use: No   Drug use: No   Sexual activity: Not Currently    Birth control/protection: Surgical, Post-menopausal  Other Topics Concern   Not on file  Social History Narrative   Not on file   Social Determinants of Health   Financial Resource Strain: Not on file  Food Insecurity: Not on file  Transportation Needs: Not on file  Physical Activity: Not on file  Stress: Not on file  Social Connections: Not on file  Intimate Partner Violence: Not on file     Review of Systems   Gen: Denies any fever, chills, loss of appetite, change in weight or weight loss CV: Denies chest pain, heart palpitations, syncope, edema  Resp: Denies shortness of breath with rest, cough, wheezing, coughing up blood, and pleurisy. GI: see HPI GU :see HPI MS: see HPI Derm: Denies rash, itching, dry skin, hives. Psych: Denies depression, anxiety, memory loss, hallucinations, and confusion. Heme: see HPI Neuro:  Denies any headaches, dizziness, paresthesias, shaking  Physical Exam   Vital Signs in last 24 hours: Temp:  [97.4 F (36.3 C)-97.8 F (36.6 C)] 97.4 F (36.3 C) (06/13 0541) Pulse Rate:  [82-101] 101 (06/13 0840) Resp:  [12-20] 18 (06/13 0541) BP: (105-156)/(50-89) 156/89 (06/13 0840) SpO2:  [91 %-97 %] 95 % (06/13 0720) Weight:  [74.8 kg-77.1 kg] 74.8 kg (06/13 0141) Last BM Date : 05/05/22  General:   Alert, frail, pleasant and cooperative. Hard of  hearing Head:  Normocephalic and atraumatic. Eyes:  Sclera clear, no icterus.   Conjunctiva pink. Ears:  HOH Mouth:  No deformity or lesions, dentition normal. Lungs:  Clear throughout to auscultation.    Heart:  S1 S2 present without murmurs Abdomen:  Soft, very mild TTP and nondistended. No masses, hepatosplenomegaly or hernias noted. Normal bowel sounds, without guarding, and without rebound.   Rectal: deferred   Msk:  Symmetrical without gross deformities. Normal posture. Extremities:  Without  edema. Neurologic:  Alert and  oriented x4. Psych:  Alert and cooperative. Normal mood and affect.  Intake/Output from previous day: No intake/output data recorded. Intake/Output this shift: Total I/O In: -  Out: 200 [Urine:200]    Labs/Studies   Recent Labs Recent Labs    05/04/22 1904 05/05/22 0423  WBC 10.9* 9.7  HGB 8.7* 8.5*  HCT 26.7* 26.7*  PLT 532* 515*   BMET Recent Labs    05/04/22 1904 05/05/22 0423  NA 127* 131*  K 3.2* 4.1  CL 94* 98  CO2 24 23  GLUCOSE 121* 177*  BUN 11 10  CREATININE 0.63 0.58  CALCIUM 8.5* 8.7*   LFT Recent Labs    05/04/22 1904 05/05/22 0423  PROT 6.5 6.6  ALBUMIN 2.3* 2.4*  AST 39 37  ALT 33 34  ALKPHOS 132* 128*  BILITOT 0.8 0.7    Radiology/Studies CT Abdomen Pelvis W Contrast  Result Date: 05/05/2022 CLINICAL DATA:  Abdominal pain, acute, nonlocalized EXAM: CT ABDOMEN AND PELVIS WITH CONTRAST TECHNIQUE: Multidetector CT imaging of the abdomen and pelvis was performed using the standard protocol following bolus administration of intravenous contrast. RADIATION DOSE REDUCTION: This exam was performed according to the departmental dose-optimization program which includes automated exposure control, adjustment of the mA and/or kV according to patient size and/or use of iterative reconstruction technique. CONTRAST:  38mL OMNIPAQUE IOHEXOL 300 MG/ML  SOLN COMPARISON:  04/06/2022 FINDINGS: Lower chest: Moderate-sized hiatal  hernia.  No acute abnormality. Hepatobiliary: No focal liver abnormality is seen. Status post cholecystectomy. No biliary dilatation. Pancreas: No focal abnormality or ductal dilatation. Spleen: No focal abnormality.  Normal size. Adrenals/Urinary Tract: No adrenal abnormality. No focal renal abnormality. No stones or hydronephrosis. Urinary bladder is unremarkable. Stomach/Bowel: Few scattered sigmoid diverticula. No active diverticulitis. Stomach and small bowel decompressed, unremarkable. Vascular/Lymphatic: Scattered aortic calcifications. No evidence of aneurysm or adenopathy. Reproductive: Prior hysterectomy.  No adnexal masses. Other: No free fluid or free air. Musculoskeletal: No acute bony abnormality. IMPRESSION: No acute findings in the abdomen or pelvis. Sigmoid diverticulosis. Moderate-sized hiatal hernia. Electronically Signed   By: Charlett Nose M.D.   On: 05/05/2022 01:56     Assessment   Pamela Reed is an 88 y.o. year old female with history of sigmoid diverticulitis in May 2023 [facility]) s/p treatment with Cipro and Flagyl, presenting to [facility] with concerns for GI bleeding with dark stool and drifting Hgb s/p EGD on 04/21/22 with mildly torturous esophagus, 7 cm hiatal hernia, otherwise normal stomach and duodenum. She has been a resident at the [facility] since that time. Marked functional decline noted since initial hospitalization in May 2023 per family and patient; prior to that admission, she had been living independently.  Patient presenting from [facility] yesterday with persistent nausea, fatigue, weakness, frequent stools with Hgb 8.7 on admission and heme positive stool but no overt GI bleeding.  Normocytic anemia: with heme positive stool this admission. Hgb 8.7 on admission, now 8.5. Discharge Hgb from [facility] a week ago was 8.9 but no transfusions. Hgb in the 11/12 range in May 2023 at [facility]. No anticoagulation. Denies NSAIDs other than 81 mg aspirin daily. EGD  recently 04/21/22 at outside facility as noted without source for bleeding. Unable to rule out small bowel etiology or occult lower GI etiology. She is not overtly or actively bleeding at this time but has had drifting Hgb over past few weeks. Does not feel she can swallow capsule on her own as she has difficulty with large pills. Ultimately, she will need colonoscopy as outpatient if health permits due to recent diverticulitis episode and need to rule out occult neoplasm (last in 2012).   Frequent stools: no diarrhea currently. From review of chart, she has a history of frequent stools in the past. I do wonder if she is not having productive emptying as she notes multiple urges for BM and could have constipation underlying contributing to her overall abdominal discomfort. CT unrevealing this admission.   Nausea: unclear etiology. With known 7 cm hiatal hernia, query this contributing to uncontrolled GERD.   Patient has had marked functional decline since initial hospitalization for diverticulitis in May 2023, now residing at [facility] and previously living independently. May want to consider having palliative on board to assist with goals of care moving forward.   As patient unable to swallow capsule on own, will pursue EGD with capsule placement today by Dr. Levon Hedger. I discussed risks and benefits  with both patient and daughter, who both state understanding.    Plan / Recommendations    Remain NPO Continue IV PPI BID Proceed with upper endoscopy by Dr. Levon Hedger today with possible capsule placement:  the risks, benefits, and alternatives have been discussed with the patient in detail. The patient states understanding and desires to proceed.  Outpatient colonoscopy Follow H/H Further recommendations to follow     05/05/2022, 10:23 AM  Gelene Mink, PhD, ANP-BC [facility]

## 2022-05-05 NOTE — Assessment & Plan Note (Signed)
-   Guaiac positive -Holding aspirin -N.p.o. after midnight -GI to see in the a.m. -CT abdomen pelvis shows no acute findings.  Sigmoid diverticulosis.  Moderate size hiatal hernia -Continue PPI -Trend CBC -Continue to monitor

## 2022-05-05 NOTE — NC FL2 (Deleted)
[place] MEDICAID FL2 LEVEL OF CARE SCREENING TOOL     IDENTIFICATION  Patient Name: Pamela Reed Birthdate: August 08, 1931 Sex: female Admission Date (Current Location): 05/04/2022  [facility] and [place] Number:  [employer] and Address:  [facility],  [address], [place] [address]      Provider Number: [id number]  Attending Physician Name and Address:  Vassie Loll, MD  Relative Name and Phone Number:  Lemaire,Bridget Daughter     [phone]    Current Level of Care: Hospital Recommended Level of Care: Skilled Nursing Facility Prior Approval Number:    Date Approved/Denied:   PASRR Number: [id number]  Discharge Plan: SNF    Current Diagnoses: Patient Active Problem List   Diagnosis Date Noted   Protein-calorie malnutrition, moderate (HCC) 05/05/2022   Thrombocytosis 05/05/2022   GI bleed 05/04/2022   Diverticulitis 04/11/2022   Physical deconditioning 04/11/2022   Known medical problems 04/11/2022   Hyponatremia 03/17/2021   Hypokalemia 03/17/2021   Dysphagia 03/17/2021   HTN (hypertension) 03/17/2021   Hypothyroidism 03/17/2021   Anxiety 03/17/2021   Depression 03/17/2021   Glaucoma 03/17/2021   RLS (restless legs syndrome) 01/01/2017   B12 deficiency 11/18/2016   Chronic cystitis 12/17/2012   Mixed urge and stress incontinence 12/17/2012    Orientation RESPIRATION BLADDER Height & Weight     Self, Time, Situation, Place  Normal Incontinent Weight: 164 lb 14.5 oz (74.8 kg) Height:  5\' 2"  (157.5 cm)  BEHAVIORAL SYMPTOMS/MOOD NEUROLOGICAL BOWEL NUTRITION STATUS      Continent Diet (see d/c summary)  AMBULATORY STATUS COMMUNICATION OF NEEDS Skin   Extensive Assist Verbally Normal                       Personal Care Assistance Level of Assistance  Bathing, Feeding, Dressing Bathing Assistance: Limited assistance Feeding assistance: Limited assistance Dressing Assistance: Limited assistance     Functional  Limitations Info  Sight, Hearing, Speech Sight Info: Adequate Hearing Info: Adequate Speech Info: Adequate    SPECIAL CARE FACTORS FREQUENCY                       Contractures Contractures Info: Not present    Additional Factors Info  Code Status, Allergies Code Status Info: DNR Allergies Info: Contrast media, iodinated contrast media, iodine sulfasalazine, dye fdc red, lactose intolerance, penicillins, shellfish allergy, sulfa antibiotics, venlafaxine, codeine, ropinirole           Current Medications (05/05/2022):  This is the current hospital active medication list Current Facility-Administered Medications  Medication Dose Route Frequency Provider Last Rate Last Admin   0.9 %  sodium chloride infusion   Intravenous Continuous Zierle-Ghosh, Asia B, DO 75 mL/hr at 05/05/22 0407 New Bag at 05/05/22 0407   acetaminophen (TYLENOL) tablet 650 mg  650 mg Oral Q6H PRN Zierle-Ghosh, Asia B, DO       Or   acetaminophen (TYLENOL) suppository 650 mg  650 mg Rectal Q6H PRN Zierle-Ghosh, Asia B, DO       ALPRAZolam (XANAX) tablet 0.25 mg  0.25 mg Oral BID PRN Zierle-Ghosh, Asia B, DO       amLODipine (NORVASC) tablet 10 mg  10 mg Oral Daily Zierle-Ghosh, Asia B, DO   10 mg at 05/05/22 0840   FLUoxetine (PROZAC) capsule 10 mg  10 mg Oral Daily Zierle-Ghosh, Asia B, DO   10 mg at 05/05/22 0840   levothyroxine (SYNTHROID) tablet 50 mcg  50 mcg  Oral Q0600 Zierle-Ghosh, Asia B, DO   50 mcg at 05/05/22 0408   lisinopril (ZESTRIL) tablet 20 mg  20 mg Oral BID Zierle-Ghosh, Asia B, DO   20 mg at 05/05/22 0840   metoprolol succinate (TOPROL-XL) 24 hr tablet 25 mg  25 mg Oral BID Zierle-Ghosh, Asia B, DO   25 mg at 05/05/22 0840   mometasone-formoterol (DULERA) 100-5 MCG/ACT inhaler 2 puff  2 puff Inhalation BID Zierle-Ghosh, Asia B, DO   2 puff at 05/05/22 0720   morphine (PF) 2 MG/ML injection 2 mg  2 mg Intravenous Q2H PRN Zierle-Ghosh, Asia B, DO       ondansetron (ZOFRAN) tablet 4 mg  4 mg  Oral Q6H PRN Zierle-Ghosh, Asia B, DO       Or   ondansetron (ZOFRAN) injection 4 mg  4 mg Intravenous Q6H PRN Zierle-Ghosh, Asia B, DO       oxyCODONE (Oxy IR/ROXICODONE) immediate release tablet 5 mg  5 mg Oral Q4H PRN Zierle-Ghosh, Asia B, DO       pantoprazole (PROTONIX) injection 40 mg  40 mg Intravenous BID Zierle-Ghosh, Asia B, DO   40 mg at 05/05/22 0840   simvastatin (ZOCOR) tablet 5 mg  5 mg Oral QHS Zierle-Ghosh, Asia B, DO       Zinc Oxide 40 % PSTE   Topical TID PRN Vassie Loll, MD         Discharge Medications: Please see discharge summary for a list of discharge medications.  Relevant Imaging Results:  Relevant Lab Results:   Additional Information ss [id number]  Annice Needy, LCSW

## 2022-05-05 NOTE — Progress Notes (Signed)
  Transition of Care [facility]) Screening Note   Patient Details  Name: Pamela Reed Date of Birth: 03-Nov-1931   Transition of Care [facility]) CM/SW Contact:    Ihor Gully, LCSW Phone Number: 05/05/2022, 1:36 PM    Transition of Care Department [facility]) has reviewed patient and no TOC needs have been identified at this time. We will continue to monitor patient advancement through interdisciplinary progression rounds. If new patient transition needs arise, please place a TOC consult.

## 2022-05-05 NOTE — Plan of Care (Signed)
  Problem: Acute Rehab PT Goals(only PT should resolve) Goal: Pt will Roll Supine to Side Outcome: Progressing Flowsheets (Taken 05/05/2022 1459) Pt will Roll Supine to Side: with supervision Goal: Pt Will Go Supine/Side To Sit Outcome: Progressing Flowsheets (Taken 05/05/2022 1459) Pt will go Supine/Side to Sit:  with minimal assist  with HOB elevated Goal: Pt Will Go Sit To Supine/Side Outcome: Progressing Flowsheets (Taken 05/05/2022 1459) Pt will go Sit to Supine/Side: with minimal assist Goal: Patient Will Transfer Sit To/From Stand Outcome: Progressing Flowsheets (Taken 05/05/2022 1459) Patient will transfer sit to/from stand: with minimal assist Goal: Pt Will Transfer Bed To Chair/Chair To Bed Outcome: Progressing Flowsheets (Taken 05/05/2022 1459) Pt will Transfer Bed to Chair/Chair to Bed: with max assist   Britta Mccreedy D. Hartnett-Rands, MS, PT [facility] [phone] 05/05/2022

## 2022-05-05 NOTE — Assessment & Plan Note (Addendum)
-   Replaced -Recheck in the a.m. along with magnesium--2.1

## 2022-05-05 NOTE — Evaluation (Signed)
Physical Therapy Evaluation Patient Details Name: Pamela Reed MRN: [id number] DOB: 1931-11-01 Today's Date: 05/05/2022  History of Present Illness  Pamela Reed is a 88 y.o. female with medical history significant of with history of COPD, GERD, hypertension, hard of hearing with hearing aid, hypothyroidism, mixed urinary incontinence, and recent hospitalizations for diarrhea/GI bleed the, and more presents the ED with a chief complaint of generalized weakness.  Patient reports that this has been a long course.  She reports that in May she had been admitted to [facility] for nausea vomiting and diarrhea.  They gave her antibiotics and medication for nausea and sent her home with home health.  Apparently she did not have help 1 night when she needed to get up to go to the bathroom and she fell and could not stand back up.  She went to [facility] in [place] at that time.  She was having diarrhea and melena at that time.  She describes her bowel movements is looking like black oil.  She thinks she was again given some antibiotics.  An upper GI was done at the end of May.  She was again discharged to rehab.  Patient reports that she has only gotten worse.  She has become so weak that she can barely handle any of her ADLs.  She reports she is having diarrhea 4-5 times per day.  She is not sure anymore if that is melena or hematochezia or just liquid, as she has not been looking at it.  She reports she has nausea and vomiting every time she eats.  She also has left lower quadrant pain that feels like a cramping.  Patient also complains of early satiety, and subjective fevers.  She says that she has shortness of breath but she describes it as being so fatigued that she feels like she has to take a break, especially when chewing-which seems to be her main exertion for the day.  Patient reports that she has dysuria but it has improved from previous hospitalization.  She has no other complaints at this time.    Clinical Impression  Patient lying supine upon therapist arrival and agreeable to participating in PT evaluation today. Patient requires assistance for all mobility. Patient has a left thumb spica splint on reporting fractured bones and NWB through the left wrist/hand per her MD. Patient requires min to mod assist for supine to/from sit transfers. Patient was able to boost herself up in bed with cues using her right UE and lower extremities and use of bed rails. Patient requires min guard to mod assist for transfers using compensatory strategies using assist at left elbow for transfers due to left wrist/hand fracture and thumb spica splint. Patient fatigues quickly exhibiting dyspnea on exertion. Patient also requires cuing for sequencing of steps and placement of upper extremities with use of RW for transfers.  Today patient unable to [facility] either lower extremity to take side steps along the bed with use of RW and mod assist. Patient would continue to benefit from skilled physical therapy in current environment and next venue to continue return to prior function and increase strength, endurance, balance, coordination, and functional mobility and gait skills.         Recommendations for follow up therapy are one component of a multi-disciplinary discharge planning process, led by the attending physician.  Recommendations may be updated based on patient status, additional functional criteria and insurance authorization.  Follow Up Recommendations Skilled nursing-short term rehab (<3 hours/day)  Assistance Recommended at Discharge Frequent or constant Supervision/Assistance  Patient can return home with the following  A lot of help with walking and/or transfers;A lot of help with bathing/dressing/bathroom;Assist for transportation;Assistance with cooking/housework;Help with stairs or ramp for entrance;Direct supervision/assist for financial management    Equipment Recommendations None recommended  by PT  Recommendations for Other Services       Functional Status Assessment Patient has had a recent decline in their functional status and demonstrates the ability to make significant improvements in function in a reasonable and predictable amount of time.     Precautions / Restrictions Precautions Precautions: Fall Restrictions Weight Bearing Restrictions: No      Mobility  Bed Mobility Overal bed mobility: Needs Assistance Bed Mobility: Supine to Sit, Sit to Supine     Supine to sit: Min assist, Mod assist, HOB elevated Sit to supine: Min assist, Mod assist   General bed mobility comments: assist for trunk with supine to sit; assist for trunk and LEs with sit to supine    Transfers Overall transfer level: Needs assistance Equipment used: Rolling walker (2 wheels) Transfers: Sit to/from Stand Sit to Stand: Mod assist       Anterior-Posterior transfers: Min assist, Min guard  Lateral/Scoot Transfers: Min assist, Mod assist General transfer comment: compensatory strategies using left elbow for transfers due to left wrist fracture, splint and reported NWB; fatigues quickly, dyspnea on exertion    Ambulation/Gait       General Gait Details: patient unable to unweight either lower extremity to take side steps along the bed  Stairs      Wheelchair Mobility    Modified Rankin (Stroke Patients Only)       Balance Overall balance assessment: Needs assistance, History of Falls Sitting-balance support: Feet supported, Single extremity supported Sitting balance-Leahy Scale: Poor Sitting balance - Comments: seated at edge of bed Postural control: Posterior lean Standing balance support: Single extremity supported, During functional activity, Reliant on assistive device for balance           Pertinent Vitals/Pain Pain Assessment Pain Assessment: 0-10 Pain Score: 10-Worst pain ever Pain Location: buttocks from raw skin Pain Descriptors / Indicators:  Crying, Grimacing, Moaning Pain Intervention(s): Limited activity within patient's tolerance, Monitored during session, Repositioned    Home Living Family/patient expects to be discharged to:: Skilled nursing facility Living Arrangements: Alone Available Help at Discharge: Available 24 hours/day;Family;Available PRN/intermittently (dtr providing assist) Type of Home: House Home Access: Stairs to enter Entrance Stairs-Rails: Right Entrance Stairs-Number of Steps: 2   Home Layout: One level Home Equipment: Rollator (4 wheels)      Prior Function Prior Level of Function : Needs assist;History of Falls (last six months)             Mobility Comments: Pt reports she had been falling a lot since recent d/c home       Hand Dominance   Dominant Hand: Right    Extremity/Trunk Assessment   Upper Extremity Assessment Upper Extremity Assessment: Generalized weakness;LUE deficits/detail LUE Deficits / Details: L wrist fracture/spling; patient reports [place] ordered by MD LUE: Unable to fully assess due to immobilization    Lower Extremity Assessment Lower Extremity Assessment: Generalized weakness    Cervical / Trunk Assessment Cervical / Trunk Assessment: Normal  Communication   Communication: HOH  Cognition Arousal/Alertness: Awake/alert Behavior During Therapy: WFL for tasks assessed/performed Overall Cognitive Status: Within Functional Limits for tasks assessed        General Comments  Exercises     Assessment/Plan    PT Assessment Patient needs continued PT services  PT Problem List Decreased strength;Decreased mobility;Decreased activity tolerance;Decreased skin integrity;Decreased balance;Decreased knowledge of use of DME;Pain       PT Treatment Interventions DME instruction;Therapeutic exercise;Balance training;Gait training;Neuromuscular re-education;Therapeutic activities;Patient/family education    PT Goals (Current goals can be found in the Care  Plan section)  Acute Rehab PT Goals Patient Stated Goal: get better PT Goal Formulation: With patient/family Time For Goal Achievement: 05/19/22 Potential to Achieve Goals: Fair    Frequency Min 2X/week        AM-PAC PT "6 Clicks" Mobility  Outcome Measure Help needed turning from your back to your side while in a flat bed without using bedrails?: A Little Help needed moving from lying on your back to sitting on the side of a flat bed without using bedrails?: A Lot Help needed moving to and from a bed to a chair (including a wheelchair)?: Total Help needed standing up from a chair using your arms (e.g., wheelchair or bedside chair)?: Total Help needed to walk in hospital room?: Total Help needed climbing 3-5 steps with a railing? : Total 6 Click Score: 9    End of Session Equipment Utilized During Treatment: Gait belt Activity Tolerance: Patient limited by fatigue;Patient limited by pain Patient left: in bed;with call bell/phone within reach;with family/visitor present Nurse Communication: Mobility status PT Visit Diagnosis: Unsteadiness on feet (R26.81);Difficulty in walking, not elsewhere classified (R26.2);Muscle weakness (generalized) (M62.81);History of falling (Z91.81)    Time: [id number] PT Time Calculation (min) (ACUTE ONLY): 29 min   Charges:   PT Evaluation $PT Eval Low Complexity: 1 Low PT Treatments $Therapeutic Activity: 8-22 mins       Floria Raveling. Hartnett-Rands, MS, PT Per [place] [phone]  Pamala Hurry  Hartnett-Rands 05/05/2022, 2:48 PM

## 2022-05-05 NOTE — NC FL2 (Signed)
[place] MEDICAID FL2 LEVEL OF CARE SCREENING TOOL     IDENTIFICATION  Patient Name: Pamela Reed Birthdate: 1930-12-10 Sex: female Admission Date (Current Location): 05/04/2022  [facility] and [place] Number:  [employer] and Address:  [facility],  [address], [place] [address]      Provider Number: [id number]  Attending Physician Name and Address:  Vassie Loll, MD  Relative Name and Phone Number:  Pascuzzi,Bridget Daughter     [phone]    Current Level of Care: Hospital Recommended Level of Care: Skilled Nursing Facility Prior Approval Number:    Date Approved/Denied:   PASRR Number: [id number]  Discharge Plan: SNF    Current Diagnoses: Patient Active Problem List   Diagnosis Date Noted   Protein-calorie malnutrition, moderate (HCC) 05/05/2022   Thrombocytosis 05/05/2022   GI bleed 05/04/2022   Diverticulitis 04/11/2022   Physical deconditioning 04/11/2022   Known medical problems 04/11/2022   Hyponatremia 03/17/2021   Hypokalemia 03/17/2021   Dysphagia 03/17/2021   HTN (hypertension) 03/17/2021   Hypothyroidism 03/17/2021   Anxiety 03/17/2021   Depression 03/17/2021   Glaucoma 03/17/2021   RLS (restless legs syndrome) 01/01/2017   B12 deficiency 11/18/2016   Chronic cystitis 12/17/2012   Mixed urge and stress incontinence 12/17/2012    Orientation RESPIRATION BLADDER Height & Weight     Self, Time, Situation, Place  Normal Incontinent Weight: 164 lb 14.5 oz (74.8 kg) Height:  5\' 2"  (157.5 cm)  BEHAVIORAL SYMPTOMS/MOOD NEUROLOGICAL BOWEL NUTRITION STATUS      Continent Diet (see d/c summary)  AMBULATORY STATUS COMMUNICATION OF NEEDS Skin   Extensive Assist Verbally Normal                       Personal Care Assistance Level of Assistance  Bathing, Feeding, Dressing Bathing Assistance: Limited assistance Feeding assistance: Limited assistance Dressing Assistance: Limited assistance     Functional  Limitations Info  Sight, Hearing, Speech Sight Info: Adequate Hearing Info: Adequate Speech Info: Adequate    SPECIAL CARE FACTORS FREQUENCY  PT (By licensed PT)     PT Frequency: 5x/week              Contractures Contractures Info: Not present    Additional Factors Info  Code Status, Allergies Code Status Info: DNR Allergies Info: Contrast media, iodinated contrast media, iodine sulfasalazine, dye fdc red, lactose intolerance, penicillins, shellfish allergy, sulfa antibiotics, venlafaxine, codeine, ropinirole           Current Medications (05/05/2022):  This is the current hospital active medication list Current Facility-Administered Medications  Medication Dose Route Frequency Provider Last Rate Last Admin   0.9 %  sodium chloride infusion   Intravenous Continuous Zierle-Ghosh, Asia B, DO 75 mL/hr at 05/05/22 0407 New Bag at 05/05/22 0407   acetaminophen (TYLENOL) tablet 650 mg  650 mg Oral Q6H PRN Zierle-Ghosh, Asia B, DO       Or   acetaminophen (TYLENOL) suppository 650 mg  650 mg Rectal Q6H PRN Zierle-Ghosh, Asia B, DO       ALPRAZolam (XANAX) tablet 0.25 mg  0.25 mg Oral BID PRN Zierle-Ghosh, Asia B, DO       amLODipine (NORVASC) tablet 10 mg  10 mg Oral Daily Zierle-Ghosh, Asia B, DO   10 mg at 05/05/22 0840   FLUoxetine (PROZAC) capsule 10 mg  10 mg Oral Daily Zierle-Ghosh, Asia B, DO   10 mg at 05/05/22 0840   levothyroxine (SYNTHROID) tablet 50 mcg  50 mcg Oral Q0600 Zierle-Ghosh, Asia B, DO   50 mcg at 05/05/22 0408   lisinopril (ZESTRIL) tablet 20 mg  20 mg Oral BID Zierle-Ghosh, Asia B, DO   20 mg at 05/05/22 0840   metoprolol succinate (TOPROL-XL) 24 hr tablet 25 mg  25 mg Oral BID Zierle-Ghosh, Asia B, DO   25 mg at 05/05/22 0840   mometasone-formoterol (DULERA) 100-5 MCG/ACT inhaler 2 puff  2 puff Inhalation BID Zierle-Ghosh, Asia B, DO   2 puff at 05/05/22 0720   morphine (PF) 2 MG/ML injection 2 mg  2 mg Intravenous Q2H PRN Zierle-Ghosh, Asia B, DO        ondansetron (ZOFRAN) tablet 4 mg  4 mg Oral Q6H PRN Zierle-Ghosh, Asia B, DO       Or   ondansetron (ZOFRAN) injection 4 mg  4 mg Intravenous Q6H PRN Zierle-Ghosh, Asia B, DO       oxyCODONE (Oxy IR/ROXICODONE) immediate release tablet 5 mg  5 mg Oral Q4H PRN Zierle-Ghosh, Asia B, DO       pantoprazole (PROTONIX) injection 40 mg  40 mg Intravenous BID Zierle-Ghosh, Asia B, DO   40 mg at 05/05/22 0840   simvastatin (ZOCOR) tablet 5 mg  5 mg Oral QHS Zierle-Ghosh, Asia B, DO       Zinc Oxide 40 % PSTE   Topical TID PRN Vassie Loll, MD         Discharge Medications: Please see discharge summary for a list of discharge medications.  Relevant Imaging Results:  Relevant Lab Results:   Additional Information ss [id number]  Annice Needy, LCSW

## 2022-05-05 NOTE — TOC Initial Note (Addendum)
Transition of Care [facility]) - Initial/Assessment Note    Patient Details  Name: Pamela Reed MRN: [id number] Date of Birth: [birth year]  Transition of Care [facility]) CM/SW Contact:    Annice Needy, LCSW Phone Number: 05/05/2022, 2:50 PM  Clinical Narrative:                 Patient admitted from [facility]. Had been at [facility] for a week and a half prior to going to [place]. Plan is for return to [place]. Per daughter, at baseline prior to previous hospitalization, patient lived alone and used a walker. Since being in rehab she has been very weak and not able to do much.  Spoke with Clydie Braun at [facility] and Rehab patient will need need auth to return  Auth started.   Expected Discharge Plan: Skilled Nursing Facility Barriers to Discharge: Continued Medical Work up   Patient Goals and CMS Choice Patient states their goals for this hospitalization and ongoing recovery are:: return to rehab then home      Expected Discharge Plan and Services Expected Discharge Plan: Skilled Nursing Facility     Post Acute Care Choice: Skilled Nursing Facility Living arrangements for the past 2 months: Single Family Home, Skilled Nursing Facility                                      Prior Living Arrangements/Services Living arrangements for the past 2 months: Single Family Home, Skilled Nursing Facility Lives with:: Self Patient language and need for interpreter reviewed:: Yes Do you feel safe going back to the place where you live?: Yes      Need for Family Participation in Patient Care: Yes (Comment) Care giver support system in place?: Yes (comment) Current home services: DME Criminal Activity/Legal Involvement Pertinent to Current Situation/Hospitalization: No - Comment as needed  Activities of Daily Living Home Assistive Devices/Equipment: None ADL Screening (condition at time of admission) Patient's cognitive ability adequate to safely complete  daily activities?: Yes Is the patient deaf or have difficulty hearing?: Yes Does the patient have difficulty seeing, even when wearing glasses/contacts?: No Does the patient have difficulty concentrating, remembering, or making decisions?: No Patient able to express need for assistance with ADLs?: Yes Does the patient have difficulty dressing or bathing?: No Independently performs ADLs?: No Does the patient have difficulty walking or climbing stairs?: Yes Weakness of Legs: Both Weakness of Arms/Hands: None  Permission Sought/Granted Permission sought to share information with : Family Supports    Share Information with NAME: daughter, Clarisse Gouge           Emotional Assessment         Alcohol / Substance Use: Not Applicable Psych Involvement: No (comment)  Admission diagnosis:  Generalized abdominal pain [R10.84] GI bleed [K92.2] Gastrointestinal hemorrhage, unspecified gastrointestinal hemorrhage type [K92.2] Anemia, unspecified type [D64.9] Patient Active Problem List   Diagnosis Date Noted   Protein-calorie malnutrition, moderate (HCC) 05/05/2022   Thrombocytosis 05/05/2022   GI bleed 05/04/2022   Diverticulitis 04/11/2022   Physical deconditioning 04/11/2022   Known medical problems 04/11/2022   Hyponatremia 03/17/2021   Hypokalemia 03/17/2021   Dysphagia 03/17/2021   HTN (hypertension) 03/17/2021   Hypothyroidism 03/17/2021   Anxiety 03/17/2021   Depression 03/17/2021   Glaucoma 03/17/2021   RLS (restless legs syndrome) 01/01/2017   B12 deficiency 11/18/2016   Chronic cystitis 12/17/2012   Mixed urge and stress incontinence  12/17/2012   PCP:  Alan Mulder, MD Pharmacy:   [facility] [id number] [place], [place] - [address] [place] [address] Phone: [phone] Fax: [phone]     Social Determinants of Health (SDOH) Interventions    Readmission Risk Interventions     No data to display

## 2022-05-05 NOTE — Assessment & Plan Note (Signed)
-   Secondary to multiple hospitalizations -Came to [place] from [facility] -PT eval and treat>>SNF

## 2022-05-05 NOTE — Assessment & Plan Note (Signed)
-   Continue Xanax and Prozac -PDMP reviewed--xanax 0.25 mg, #18 on 04/15/22 and #60 on 10/27/21

## 2022-05-05 NOTE — Assessment & Plan Note (Signed)
-   Secondary to poor p.o. intake -Patient currently n.p.o. for GI evaluation in the a.m., when she is able to tolerate diet encourage nutrient dense food options -Trend in the a.m.

## 2022-05-05 NOTE — Progress Notes (Signed)
Patient complaining of soreness and pain with peri care. She has had multiple loose stools back to back this shift. Groin, genitals and buttocks are red and beginning to breakdown. Nurse notified Dr. Carren Rang, new order for Flexi-seal. Flexi-seal placed and patient tolerated well.

## 2022-05-05 NOTE — Progress Notes (Signed)
Patient seen and examined; admitted after midnight secondary to intermittent episode of nausea/vomiting, general malaise/weakness and concern for GI bleed.  Fecal occult blood test positive.  Downtrend in hemoglobin level from baseline (was 11 3 weeks ago and is now 8.7).  Patient also reported increased fatigability and intermittent cramping left lower quadrant pain.  Patient found to be hemodynamically stable, afebrile, no complaining of chest pain and demonstrating good saturation on room air.  On exam she is chronically ill in appearance, hard of hearing and frail.  Please refer to H&P written by Dr. Carren Rang on 05/05/22 for further info/details on admission.  Plan: -Continue to follow hemoglobin trend and transfuse for hemoglobin less than 7.5 -Follow GI service recommendation -Continue as needed antiemetics and advance diet as tolerated when cleared by GI. -continue supportive care and maintain adequate hydration with fluid resuscitation.  Vassie Loll MD [phone]

## 2022-05-05 NOTE — Anesthesia Procedure Notes (Addendum)
Date/Time: 05/05/2022 11:40 AM  Performed by: Franco Nones, CRNAPre-anesthesia Checklist: Patient identified, Emergency Drugs available, Suction available, Timeout performed and Patient being monitored Patient Re-evaluated:Patient Re-evaluated prior to induction Oxygen Delivery Method: Nasal Cannula

## 2022-05-05 NOTE — Assessment & Plan Note (Addendum)
Continue Synthroid °

## 2022-05-05 NOTE — TOC Progression Note (Signed)
Transition of Care [facility]) - Progression Note    Patient Details  Name: Pamela Reed MRN: [id number] Date of Birth: [birth year]  Transition of Care [facility]) CM/SW Contact  Demarquis Osley, Juleen China, LCSW Phone Number: 05/05/2022, 4:38 PM  Clinical Narrative:    Call from daughter, [place], indicating that she and her siblings were going to speak with her mother to determine if patient wants to go return to [facility]hab or go to different facility.    Expected Discharge Plan: Skilled Nursing Facility Barriers to Discharge: Continued Medical Work up  Expected Discharge Plan and Services Expected Discharge Plan: Skilled Nursing Facility     Post Acute Care Choice: Skilled Nursing Facility Living arrangements for the past 2 months: Single Family Home, Skilled Nursing Facility                                       Social Determinants of Health (SDOH) Interventions    Readmission Risk Interventions     No data to display

## 2022-05-05 NOTE — Op Note (Signed)
[facility] Patient Name: Pamela Reed Procedure Date: 05/05/2022 11:06 AM MRN: [id number] Date of Birth: 12/01/1930 Attending MD: Katrinka Blazing ,  CSN: [id number] Age: 88 Admit Type: Inpatient Procedure:                Upper GI endoscopy Indications:              Iron deficiency anemia, Melena Providers:                Katrinka Blazing, Angelica Ran, Kristine L. Jessee Avers, Technician Referring MD:              Medicines:                Monitored Anesthesia Care Complications:            No immediate complications. Estimated Blood Loss:     Estimated blood loss: none. Procedure:                Pre-Anesthesia Assessment:                           - Prior to the procedure, a History and Physical                            was performed, and patient medications, allergies                            and sensitivities were reviewed. The patient's                            tolerance of previous anesthesia was reviewed.                           - The risks and benefits of the procedure and the                            sedation options and risks were discussed with the                            patient. All questions were answered and informed                            consent was obtained.                           - ASA Grade Assessment: III - A patient with severe                            systemic disease.                           After obtaining informed consent, the endoscope was                            passed under direct vision. Throughout the  procedure, the patient's blood pressure, pulse, and                            oxygen saturations were monitored continuously. The                            [id number]-H190 ([id number] scope was introduced through the                            mouth, and advanced to the third part of duodenum.                            The upper GI endoscopy was accomplished without                             difficulty. The patient tolerated the procedure                            well. Scope In: 11:49:50 AM Scope Out: 11:58:47 AM Total Procedure Duration: 0 hours 8 minutes 57 seconds  Findings:      A 4 cm hiatal hernia was present. This was a Hill Grade IV hernia.      The entire examined stomach was normal.      The examined duodenum was normal. Endoscopic capsule was successfully       deployed in the duodenal bulb. Impression:               - 4 cm hiatal hernia.                           - Normal stomach.                           - Normal examined duodenum.                           - No specimens collected. Moderate Sedation:      Per Anesthesia Care Recommendation:           - Return patient to hospital ward for ongoing care.                           - Check H/H every day.                           - Follow up capsule endoscopy results. Procedure Code(s):        --- Professional ---                           [id number], Esophagogastroduodenoscopy, flexible,                            transoral; diagnostic, including collection of                            specimen(s) by brushing or washing, when performed                            (  separate procedure) Diagnosis Code(s):        --- Professional ---                           K44.9, Diaphragmatic hernia without obstruction or                            gangrene                           D50.9, Iron deficiency anemia, unspecified                           K92.1, Melena (includes Hematochezia) CPT copyright 2019 [employer] Association. All rights reserved. The codes documented in this report are preliminary and upon coder review may  be revised to meet current compliance requirements. Katrinka Blazing, MD Katrinka Blazing,  05/05/2022 12:06:18 PM This report has been signed electronically. Number of Addenda: 0

## 2022-05-05 NOTE — H&P (Signed)
History and Physical    Patient: Pamela Reed DOB: 06/21/1931 DOA: 05/04/2022 DOS: the patient was seen and examined on 05/05/2022 PCP: Alan MulderMorayati, Shamil J, MD  Patient coming from: Rehab  Chief Complaint:  Chief Complaint  Patient presents with   Emesis   HPI: Pamela Reed is a 88 y.o. female with medical history significant of with history of COPD, GERD, hypertension, hard of hearing with hearing aid, hypothyroidism, mixed urinary incontinence, and recent hospitalizations for diarrhea/GI bleed the, and more presents the ED with a chief complaint of generalized weakness.  Patient reports that this has been a long course.  She reports that in May she had been admitted to [facility] for nausea vomiting and diarrhea.  They gave her antibiotics and medication for nausea and sent her home with home health.  Apparently she did not have help 1 night when she needed to get up to go to the bathroom and she fell and could not stand back up.  She [facility] in [place] at that time.  She was having diarrhea and melena at that time.  She describes her bowel movements is looking like black oil.  She thinks she was again given some antibiotics.  An upper GI was done at the end of May.  She was again discharged to rehab.  Patient reports that she has only gotten worse.  She has become so weak that she can barely handle any of her ADLs.  She reports she is having diarrhea 4-5 times per day.  She is not sure anymore if that is melena or hematochezia or just liquid, as she has not been looking at it.  She reports she has nausea and vomiting every time she eats.  She also has left lower quadrant pain that feels like a cramping.  Patient also complains of early satiety, and subjective fevers.  She says that she has shortness of breath but she describes it as being so fatigued that she feels like she has to take a break, especially when chewing-which seems to be her main exertion for the day.   Patient reports that she has dysuria but it has improved from previous hospitalization.  She has no other complaints at this time.  Patient does not smoke, does not drink alcohol, does not use illicit drugs.  She is vaccinated for COVID x1 but had an allergic reaction and did not have any more shots.  Patient would like to be DNR. Review of Systems: As mentioned in the history of present illness. All other systems reviewed and are negative. Past Medical History:  Diagnosis Date   Arthritis    spine   Cervicalgia    Chronic cystitis    COPD (chronic obstructive pulmonary disease) (HCC)    Deafness in left ear    s/p skull fracture - age 48   GERD (gastroesophageal reflux disease)    Headache    daily. 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   Hypertension    Hypothyroidism    Mixed incontinence urge and stress    Vertigo    Wears hearing aid    right ear   Past Surgical History:  Procedure Laterality Date   ABDOMINAL HYSTERECTOMY     CATARACT EXTRACTION W/ INTRAOCULAR LENS IMPLANT     CHOLECYSTECTOMY     COLONOSCOPY     ESOPHAGOGASTRODUODENOSCOPY (EGD) WITH PROPOFOL N/A 05/12/2021   Procedure:  ESOPHAGOGASTRODUODENOSCOPY (EGD) WITH PROPOFOL;  Surgeon: Regis Bill, MD;  Location: [facility] ENDOSCOPY;  Service: Endoscopy;  Laterality: N/A;   EYE SURGERY Bilateral    eye shunts   FOOT SURGERY     GLAUCOMA SURGERY     PHOTOCOAGULATION WITH LASER Left 09/07/2016   Procedure: PHOTOCOAGULATION WITH LASER;  Surgeon: Sherald Hess, MD;  Location: [facility];  Service: Ophthalmology;  Laterality: Left;  LEFT   TONSILLECTOMY     Social History:  reports that she has never smoked. She has never used smokeless tobacco. She reports that she does not drink alcohol and does not use drugs.  Allergies  Allergen Reactions   Contrast Media [Iodinated Contrast Media] Shortness Of Breath   Iodine Shortness Of Breath   Sulfasalazine  Itching and Swelling   Dye Fdc Red [Red Dye]    Lactose Intolerance (Gi)     GI upset   Penicillins     Childhood event-unknown reaction   Shellfish Allergy Nausea Only and Swelling    Throat swelling   Sulfa Antibiotics Itching and Swelling   Venlafaxine Swelling    Throat   Codeine Palpitations   Ropinirole Nausea Only    History reviewed. No pertinent family history.  Prior to Admission medications   Medication Sig Start Date End Date Taking? Authorizing Provider  acetaminophen (TYLENOL) 500 MG tablet Take by mouth. 04/29/22  Yes [provider]  B Complex Vitamins (VITAMIN B-COMPLEX) TABS Take by mouth. 01/13/13  Yes [provider]  Calcium Carb-Cholecalciferol 600-10 MG-MCG TABS Take by mouth. 01/13/13  Yes [provider]  ferrous sulfate 325 (65 FE) MG tablet Take by mouth. 04/25/22 04/25/23 Yes [provider]  fluticasone (FLONASE) 50 MCG/ACT nasal spray 2 sprays into each nostril daily. 12/24/21  Yes [provider]  levothyroxine (SYNTHROID) 25 MCG tablet Take by mouth. 12/16/12  Yes [provider]  ondansetron (ZOFRAN-ODT) 4 MG disintegrating tablet Take by mouth. 04/29/22 05/29/22 Yes [provider]  simvastatin (ZOCOR) 5 MG tablet Take by mouth. 03/13/22  Yes [provider]  albuterol (VENTOLIN HFA) 108 (90 Base) MCG/ACT inhaler Inhale 2 puffs into the lungs every 6 (six) hours as needed. Patient not taking: Reported on 04/12/2022 05/17/20   [provider]  ALPRAZolam Prudy Feeler) 0.25 MG tablet Take 0.25 mg by mouth 2 (two) times daily as needed. 03/07/21   [provider]  amLODipine (NORVASC) 10 MG tablet Take 10 mg by mouth daily. 04/24/22   [provider]  aspirin 81 MG tablet Take 81 mg by mouth daily.    [provider]  FLUoxetine (PROZAC) 10 MG tablet Take 10 mg by mouth daily.     [provider]  Fluoxetine HCl, PMDD, 10 MG TABS Take 1 tablet by mouth daily.     [provider]  fluticasone (FLONASE) 50 MCG/ACT nasal spray Place 2 sprays into both nostrils daily. 03/31/22   [provider]  furosemide (LASIX) 20 MG tablet Take 1 tablet (20 mg total) by mouth daily. Please hold until diarrhea completely resolved 04/14/22   Arnetha Courser, MD  levothyroxine (SYNTHROID, LEVOTHROID) 50 MCG tablet Take 50 mcg by mouth daily before breakfast.    [provider]  lisinopril (PRINIVIL,ZESTRIL) 20 MG tablet Take 20 mg by mouth 2 (two) times daily.     [provider]  lisinopril (ZESTRIL) 20 MG tablet Take by mouth.    [provider]  Magnesium 500 MG TABS Take 500 mg by  mouth daily. Patient not taking: Reported on 04/12/2022    [provider]  metoprolol succinate (TOPROL-XL) 25 MG 24 hr tablet Take 25 mg by mouth 2 (two) times daily.     [provider]  Multiple Vitamin (MULTIVITAMIN WITH MINERALS) TABS tablet Take 1 tablet by mouth daily. 04/15/22   Arnetha Courser, MD  Netarsudil Dimesylate (RHOPRESSA) 0.02 % SOLN Place 1 drop into both eyes at bedtime.    [provider]  omeprazole (PRILOSEC OTC) 20 MG tablet Take 20 mg by mouth daily as needed (heartburn).    [provider]  simvastatin (ZOCOR) 5 MG tablet Take 5 mg by mouth at bedtime. 03/13/22   [provider]  SYMBICORT 80-4.5 MCG/ACT inhaler Inhale into the lungs. 02/24/21   [provider]  VYZULTA 0.024 % SOLN Place 1 drop into both eyes at bedtime.     [provider]    Physical Exam: Vitals:   05/04/22 2230 05/04/22 2300 05/05/22 0000 05/05/22 0141  BP: (!) 120/54 135/64 137/72 132/68  Pulse: 87 90 94 95  Resp:  12 16 17   Temp:    97.7 F (36.5 C)  TempSrc:    Oral  SpO2: 93% 97% 95% 95%  Weight:    74.8 kg  Height:    5\' 2"  (1.575 m)   1.  General: Patient lying supine in bed,  no acute distress   2. Psychiatric: Alert and oriented x 3, mood and behavior normal for situation,  pleasant and cooperative with exam   3. Neurologic: Speech and language are normal, face is symmetric, moves all 4 extremities voluntarily, at baseline without acute deficits on limited exam   4. HEENMT:  Head is atraumatic, normocephalic, pupils reactive to light, neck is supple, trachea is midline, mucous membranes are moist   5. Respiratory : Lungs are clear to auscultation bilaterally without wheezing, rhonchi, rales, no cyanosis, no increase in work of breathing or accessory muscle use   6. Cardiovascular : Heart rate normal, rhythm is regular, no murmurs, rubs or gallops, no peripheral edema, peripheral pulses palpated   7. Gastrointestinal:  Abdomen is soft, nondistended, tender to palpation in the right lower quadrant, but more tender to palpation in the left lower quadrant,  bowel sounds active, no masses or organomegaly palpated   8. Skin:  Skin is warm, dry and intact without rashes, acute lesions, or ulcers on limited exam   9.Musculoskeletal:  No acute deformities or trauma, no asymmetry in tone, no peripheral edema, peripheral pulses palpated, no tenderness to palpation in the extremities  Data Reviewed: In the ED Temp 97.8, heart rate 82-90, respiratory rate 12-20, blood pressure 135/64, satting 97% on room air No leukocytosis with a white blood cell count of 10.9, however she does have a thrombocytosis at 532, and a downtrending hemoglobin which is currently 8.7 Patient has a slight hyponatremia 127, which is also been gradually trending down Hypokalemia 3.2 Kidney function normal with a BUN of 11, creatinine 0.63, bicarb 24 CT abdomen pelvis shows no acute findings Guaiac positive GI was consulted and advises to keep patient n.p.o. after midnight and they will see in the a.m. Admission requested for GI bleed Assessment and Plan: * GI bleed - Guaiac positive -Holding aspirin -N.p.o. after midnight -GI to see in the a.m. -CT abdomen pelvis shows no acute  findings.  Sigmoid diverticulosis.  Moderate size hiatal hernia -Continue PPI -Trend CBC -Continue to monitor  Hypokalemia - Replaced in the ED -Recheck  in the a.m. along with magnesium  Hypothyroidism - Continue Synthroid  Anxiety - Continue Xanax and Prozac  Physical deconditioning - Secondary to multiple hospitalizations -Came to [place] from rehab -PT eval and treat  Thrombocytosis - Acute phase reactant -Low suspicion for infection at this time, but will get procalcitonin to further evaluate -CT abdomen pelvis does not show signs of infection -No leukocytosis -Patient has been under significant stress with multiple hospitalizations and moving to different rehabs which likely was for this acute phase reaction  Protein-calorie malnutrition, moderate (HCC) - Secondary to poor p.o. intake -Patient currently n.p.o. for GI evaluation in the a.m., when she is able to tolerate diet encourage nutrient dense food options -Trend in the a.m.  Hyponatremia - Continue normal saline 75 mill per hour -This has been a gradual trend down from the end of May -Trend in the a.m.      Advance Care Planning:   Code Status: DNR   Consults: GI  Family Communication: Daughter at bedside  Severity of Illness: The appropriate patient status for this patient is INPATIENT. Inpatient status is judged to be reasonable and necessary in order to provide the required intensity of service to ensure the patient's safety. The patient's presenting symptoms, physical exam findings, and initial radiographic and laboratory data in the context of their chronic comorbidities is felt to place them at high risk for further clinical deterioration. Furthermore, it is not anticipated that the patient will be medically stable for discharge from the hospital within 2 midnights of admission.   * I certify that at the point of admission it is my clinical judgment that the patient will require inpatient hospital care  spanning beyond 2 midnights from the point of admission due to high intensity of service, high risk for further deterioration and high frequency of surveillance required.*  Author: Lilyan Gilford, DO 05/05/2022 4:04 AM  For on call review [email or web address].

## 2022-05-06 DIAGNOSIS — D649 Anemia, unspecified: Secondary | ICD-10-CM | POA: Diagnosis not present

## 2022-05-06 DIAGNOSIS — R195 Other fecal abnormalities: Secondary | ICD-10-CM

## 2022-05-06 DIAGNOSIS — D500 Iron deficiency anemia secondary to blood loss (chronic): Secondary | ICD-10-CM

## 2022-05-06 DIAGNOSIS — K922 Gastrointestinal hemorrhage, unspecified: Secondary | ICD-10-CM

## 2022-05-06 LAB — CBC
HCT: 23.5 % — ABNORMAL LOW (ref 36.0–46.0)
Hemoglobin: 7.3 g/dL — ABNORMAL LOW (ref 12.0–15.0)
MCH: 28 pg (ref 26.0–34.0)
MCHC: 31.1 g/dL (ref 30.0–36.0)
MCV: 90 fL (ref 80.0–100.0)
Platelets: 498 10*3/uL — ABNORMAL HIGH (ref 150–400)
RBC: 2.61 MIL/uL — ABNORMAL LOW (ref 3.87–5.11)
RDW: 15.6 % — ABNORMAL HIGH (ref 11.5–15.5)
WBC: 12.3 10*3/uL — ABNORMAL HIGH (ref 4.0–10.5)
nRBC: 0 % (ref 0.0–0.2)

## 2022-05-06 LAB — BASIC METABOLIC PANEL
Anion gap: 6 (ref 5–15)
BUN: 9 mg/dL (ref 8–23)
CO2: 24 mmol/L (ref 22–32)
Calcium: 8.5 mg/dL — ABNORMAL LOW (ref 8.9–10.3)
Chloride: 105 mmol/L (ref 98–111)
Creatinine, Ser: 0.61 mg/dL (ref 0.44–1.00)
GFR, Estimated: >60 mL/min (ref >60)
Glucose, Bld: 117 mg/dL — ABNORMAL HIGH (ref 70–99)
Potassium: 3.7 mmol/L (ref 3.5–5.1)
Sodium: 135 mmol/L (ref 135–145)

## 2022-05-06 LAB — URINE CULTURE

## 2022-05-06 LAB — MAGNESIUM: Magnesium: 1.8 mg/dL (ref 1.7–2.4)

## 2022-05-06 MED ORDER — ONDANSETRON HCL 4 MG/2ML IJ SOLN
4.0000 mg | Freq: Four times a day (QID) | INTRAMUSCULAR | Status: DC
Start: 2022-05-06 — End: 2022-05-12
  Administered 2022-05-07 – 2022-05-12 (×20): 4 mg via INTRAVENOUS
  Filled 2022-05-06 (×21): qty 2

## 2022-05-06 MED ORDER — BOOST / RESOURCE BREEZE PO LIQD CUSTOM
1.0000 | Freq: Three times a day (TID) | ORAL | Status: DC
  Administered 2022-05-07 – 2022-05-12 (×5): 1 via ORAL

## 2022-05-06 NOTE — Assessment & Plan Note (Addendum)
-  Holding aspirin>>restart after d/c -GI consult appreciated -CT abdomen pelvis shows no acute findings.  Sigmoid diverticulosis.  Moderate size hiatal hernia -6/14 capsule--sm amount blood in stomach due to EGD trauma, no AVMs or obvious source of blood loss -discussed with GI>>defer colonoscopy for now given recent diverticulitis and clinical stability -Continue PPI -Trend CBC>>Hgb stable and improving 6/15--nulecit 250 mg x 1;  nulecit 125 mg x 1 on 6/17 -6/15 KUB>>no significant stool burden Hgb 8.4 on day of d/c Diet advanced and patient tolerated well

## 2022-05-06 NOTE — Progress Notes (Signed)
PROGRESS NOTE  Pamela Reed WNU:[id number] DOB: 1931-10-04 DOA: 05/04/2022 PCP: Alan Mulder, MD  Brief History:   88 y.o. year old female with history of sigmoid diverticulitis in May 2023, COPD, GERD,  HTN, hypothyroidism presenting with n/v/d and generalized weakness. She was then hospitalized at [facility] and transferred to [facility] for diarrhea, melena and anemia.   EGD completed at [facility] on 04/21/22 with mildly torturous esophagus, 7 cm hiatal hernia, otherwise normal stomach and duodenum. She has been a resident at the [facility] since that time.    Patient presented via EMS yesterday evening for complaints of nausea, frequent stools, fatigue, weakness. She denies any black stool; however, she was heme positive in the ED. Admitting Hgb 8.7 yesterday and today 8.5. Discharge Hgb from [facility] a week ago was 8.9.  Noted frequent stools at that time. Stool was like black oil in May 2023. She does not believe she has had any recent black stool. Main complaints of fatigue, weakness, nausea, difficulty controlling urine and bowel movements. Stool is formed and mixed with water. Manson Passey 6/13. Formed stool this morning, bristol stool scale #4. Feels nauseated with eating/drinking. No appetite. Appetite has been poor for several months. Daughter is at bedside and states patient has significantly declined since hospitalization for diverticulitis. Prior to this, she lived independently.    Prior endoscopic evaluation: EGD Apr 21, 2022: mildly torturous esophagus, 7 cm hiatal hernia, otherwise normal stomach and duodenum.  EGD June 2022: non-obstructing Schatzki ring s/p dilation, 6 cm hiatal hernia, normal stomach, normal duodenum Colonoscopy 2012-diverticulosis sigmoid. Internal hemorrhoids. Per Epic documentation. [facility], Dr. Manfred Shirts.     Assessment and Plan: * Heme positive stool -Holding aspirin -GI consult appreciated -CT abdomen pelvis shows no acute findings.   Sigmoid diverticulosis.  Moderate size hiatal hernia -6/14 capsule--sm amount blood in stomach due to EGD trauma, no AVMs or obvious source of blood loss -6/15--plan colonoscopy -Continue PPI -Trend CBC  Hyponatremia - Continue normal saline 75 mill per hour -This has been a gradual trend down from the end of May -Trend in the a.m.  Hypokalemia - Replaced in the ED -Recheck in the a.m. along with magnesium--1.8  Hypothyroidism - Continue Synthroid -check TSH  Anxiety - Continue Xanax and Prozac -PDMP reviewed--xanax 0.25 mg, #18 on 04/15/22 and #60 on 10/27/21  Physical deconditioning - Secondary to multiple hospitalizations -Came to [place] from rehab-[facility] -PT eval and treat>>SNF  Thrombocytosis - Acute phase reactant -Low suspicion for infection at this time, but will get procalcitonin to further evaluate -CT abdomen pelvis does not show signs of infection -No leukocytosis -Patient has been under significant stress with multiple hospitalizations and moving to different rehabs which likely was for this acute phase reaction  Protein-calorie malnutrition, moderate (HCC) - Secondary to poor p.o. intake -Patient currently n.p.o. for GI evaluation in the a.m., when she is able to tolerate diet encourage nutrient dense food options -Trend in the a.m.        Family Communication:   daughter updated 6/14 at bedside  Consultants:  GI  Code Status:  DNR  DVT Prophylaxis:  SCDs   Procedures: As Listed in Progress Note Above  Antibiotics: None       Subjective: Feels a little nausea.  Denies f/c, cp, sob, diarrhea, abd pain, dysuria  Objective: Vitals:   05/06/22 0441 05/06/22 0708 05/06/22 0957 05/06/22 1411  BP: (!) 126/57  128/63 (!) 118/54  Pulse: 77   81  Resp: 17   17  Temp: 97.7 F (36.5 C)   97.9 F (36.6 C)  TempSrc:    Oral  SpO2: 96% 96%  97%  Weight:      Height:        Intake/Output Summary (Last 24 hours) at 05/06/2022  1743 Last data filed at 05/06/2022 1400 Gross per 24 hour  Intake 480 ml  Output 1300 ml  Net -820 ml   Weight change: -2.311 kg Exam:  General:  Pt is alert, follows commands appropriately, not in acute distress HEENT: No icterus, No thrush, No neck mass, [place]/AT Cardiovascular: RRR, S1/S2, no rubs, no gallops Respiratory: CTA bilaterally, no wheezing, no crackles, no rhonchi Abdomen: Soft/+BS, non tender, non distended, no guarding Extremities: No edema, No lymphangitis, No petechiae, No rashes, no synovitis   Data Reviewed: I have personally reviewed following labs and imaging studies Basic Metabolic Panel: Recent Labs  Lab 05/04/22 1904 05/05/22 0423 05/06/22 0429  NA 127* 131* 135  K 3.2* 4.1 3.7  CL 94* 98 105  CO2 24 23 24   GLUCOSE 121* 177* 117*  BUN 11 10 9   CREATININE 0.63 0.58 0.61  CALCIUM 8.5* 8.7* 8.5*  MG  --  2.0 1.8   Liver Function Tests: Recent Labs  Lab 05/04/22 1904 05/05/22 0423  AST 39 37  ALT 33 34  ALKPHOS 132* 128*  BILITOT 0.8 0.7  PROT 6.5 6.6  ALBUMIN 2.3* 2.4*   Recent Labs  Lab 05/04/22 1904  LIPASE 32   No results for input(s): "AMMONIA" in the last 168 hours. Coagulation Profile: No results for input(s): "INR", "PROTIME" in the last 168 hours. CBC: Recent Labs  Lab 05/04/22 1904 05/05/22 0423 05/05/22 1313 05/05/22 1719 05/06/22 0509  WBC 10.9* 9.7 12.8* 12.2* 12.3*  NEUTROABS 7.4  --   --   --   --   HGB 8.7* 8.5* 9.1* 7.8* 7.3*  HCT 26.7* 26.7* 26.7* 24.5* 23.5*  MCV 85.9 87.0 83.4 86.9 90.0  PLT 532* 515* 581* 524* 498*   Cardiac Enzymes: No results for input(s): "CKTOTAL", "CKMB", "CKMBINDEX", "TROPONINI" in the last 168 hours. BNP: Invalid input(s): "POCBNP" CBG: No results for input(s): "GLUCAP" in the last 168 hours. HbA1C: No results for input(s): "HGBA1C" in the last 72 hours. Urine analysis:    Component Value Date/Time   COLORURINE YELLOW 05/05/2022 0126   APPEARANCEUR CLEAR 05/05/2022 0126    APPEARANCEUR Hazy (A) 10/04/2019 1315   LABSPEC 1.008 05/05/2022 0126   PHURINE 7.0 05/05/2022 0126   GLUCOSEU NEGATIVE 05/05/2022 0126   HGBUR MODERATE (A) 05/05/2022 0126   BILIRUBINUR NEGATIVE 05/05/2022 0126   BILIRUBINUR Negative 10/04/2019 1315   KETONESUR NEGATIVE 05/05/2022 0126   PROTEINUR NEGATIVE 05/05/2022 0126   NITRITE NEGATIVE 05/05/2022 0126   LEUKOCYTESUR SMALL (A) 05/05/2022 0126   Sepsis Labs: @LABRCNTIP (procalcitonin:4,lacticidven:4) ) Recent Results (from the past 240 hour(s))  Urine Culture     Status: Abnormal   Collection Time: 05/05/22  1:26 AM   Specimen: Urine, Clean Catch  Result Value Ref Range Status   Specimen Description   Final    URINE, CLEAN CATCH Performed at [facility], [address] [place], [facility] [address]    Special Requests   Final    NONE Performed at [facility], [address], [place], [facility] [address]    Culture MULTIPLE SPECIES PRESENT, SUGGEST RECOLLECTION (A)  Final   Report Status 05/06/2022 FINAL  Final  Scheduled Meds:  amLODipine  10 mg Oral Daily   feeding supplement  1 Container Oral TID BM   FLUoxetine  10 mg Oral Daily   levothyroxine  50 mcg Oral Q0600   lisinopril  20 mg Oral BID   metoprolol succinate  25 mg Oral BID   mometasone-formoterol  2 puff Inhalation BID   ondansetron (ZOFRAN) IV  4 mg Intravenous Q6H   pantoprazole (PROTONIX) IV  40 mg Intravenous BID   simvastatin  5 mg Oral QHS   Continuous Infusions:  sodium chloride 75 mL/hr at 05/05/22 2146    Procedures/Studies: CT Abdomen Pelvis W Contrast  Result Date: 05/05/2022 CLINICAL DATA:  Abdominal pain, acute, nonlocalized EXAM: CT ABDOMEN AND PELVIS WITH CONTRAST TECHNIQUE: Multidetector CT imaging of the abdomen and pelvis was performed using the standard protocol following bolus administration of intravenous contrast. RADIATION DOSE REDUCTION: This exam was performed according to the departmental dose-optimization program which includes  automated exposure control, adjustment of the mA and/or kV according to patient size and/or use of iterative reconstruction technique. CONTRAST:  75mL OMNIPAQUE IOHEXOL 300 MG/ML  SOLN COMPARISON:  04/06/2022 FINDINGS: Lower chest: Moderate-sized hiatal hernia.  No acute abnormality. Hepatobiliary: No focal liver abnormality is seen. Status post cholecystectomy. No biliary dilatation. Pancreas: No focal abnormality or ductal dilatation. Spleen: No focal abnormality.  Normal size. Adrenals/Urinary Tract: No adrenal abnormality. No focal renal abnormality. No stones or hydronephrosis. Urinary bladder is unremarkable. Stomach/Bowel: Few scattered sigmoid diverticula. No active diverticulitis. Stomach and small bowel decompressed, unremarkable. Vascular/Lymphatic: Scattered aortic calcifications. No evidence of aneurysm or adenopathy. Reproductive: Prior hysterectomy.  No adnexal masses. Other: No free fluid or free air. Musculoskeletal: No acute bony abnormality. IMPRESSION: No acute findings in the abdomen or pelvis. Sigmoid diverticulosis. Moderate-sized hiatal hernia. Electronically Signed   By: Charlett NoseKevin  Dover M.D.   On: 05/05/2022 01:56   CT ABDOMEN PELVIS WO CONTRAST  Result Date: 04/06/2022 CLINICAL DATA:  Left lower quadrant pain EXAM: CT ABDOMEN AND PELVIS WITHOUT CONTRAST TECHNIQUE: Multidetector CT imaging of the abdomen and pelvis was performed following the standard protocol without IV contrast. RADIATION DOSE REDUCTION: This exam was performed according to the departmental dose-optimization program which includes automated exposure control, adjustment of the mA and/or kV according to patient size and/or use of iterative reconstruction technique. COMPARISON:  CT abdomen and pelvis 03/18/2021 FINDINGS: Lower chest: Chronic changes in the lower lungs.  Cardiomegaly. Hepatobiliary: Liver is normal in size and contour with no suspicious mass identified. Gallbladder is surgically absent. No biliary ductal  dilatation identified. Pancreas: Unremarkable. No pancreatic ductal dilatation or surrounding inflammatory changes. Spleen: Normal in size without focal abnormality. Adrenals/Urinary Tract: Adrenal glands appear normal. No nephrolithiasis or hydronephrosis identified bilaterally. Urinary bladder is normal. Stomach/Bowel: Moderate-sized hiatal hernia. No bowel obstruction, free air or pneumatosis. Colonic diverticulosis. There is mild inflammatory fat stranding surrounding the distal sigmoid colon consistent with acute diverticulitis. No associated abscess. Appendix not visualized. Vascular/Lymphatic: Aortic atherosclerosis. No enlarged abdominal or pelvic lymph nodes. Reproductive: Status post hysterectomy. No adnexal masses. Other: No ascites. Musculoskeletal: Degenerative changes in the spine. No suspicious bony lesions identified. IMPRESSION: 1. Colonic diverticulosis with mild acute diverticulitis at the distal sigmoid colon. 2. Moderate-sized hiatal hernia. Electronically Signed   By: Jannifer Hickelaney  Williams M.D.   On: 04/06/2022 19:02    Catarina Hartshornavid Demetrias Goodbar, DO  [facility]  If 7PM-7AM, please contact night-coverage [email or web address] Password [email or web address] 05/06/2022, 5:43 PM   LOS: 2 days

## 2022-05-06 NOTE — Procedures (Signed)
  Small Bowel Givens Capsule Study Procedure date:  05/05/22  PCP:  Dr. Patrecia Pace, Delsa Sale, MD  Indication for procedure:  AVINA EBERLE is an 88 year old female with history of sigmoid diverticulitis in May 2023 [facility]) s/p treatment with Cipro and Flagyl, presenting to [facility] [place] with concerns for GI bleeding with dark stool and declining Hgb s/p EGD on 04/21/22 with mildly torturous esophagus, 7 cm hiatal hernia, otherwise normal stomach and duodenum. Patient presenting from nursing facility yesterday with persistent nausea, fatigue, weakness, frequent stools with Hgb 8.7 on admission and heme positive stool but no overt GI bleeding. Patient underwent EGD on 05/05/22 without source of bleeding, capsule endoscopy deployed into duodenum, thereafter.   Patient data:  Wt: 74.8Kg Ht: 62 inches  Findings: Capsule was completed to the ceum.  Small amount of blood noted in stomach secondary to trauma from recent EGD. No mass, lesions, AVMs or obvious sources of bleeding seen.   First Duodenal image: deployed endoscopically First Ileo-Cecal Valve image: 03:45:48 First Cecal image: 03:45:50 Small Bowel Passage time:  3h 79m  Summary & Recommendations: 88 year old female was admitted to the hospital after being transferred from the [facility] due to recurrent fatigue and black stools, who is presenting with recurrent drop in hemoglobin with heme positive stool. EGD performed 05/05/22 without findings of source of bleeding. Last colonoscopy was >10 years ago, source of bleeding has not yet been identified, recommend continue to trend h&h, monitor for overt bleeding, continue on PPI BID, avoid NSAIDs and proceed with colonoscopy for further evaluation of Nimrat, this will likely need to be done on outpatient basis given recent episode of diverticulitis in May.   Damica Gravlin L. Jeanmarie Hubert, MSN, APRN, AGNP-C Adult-Gerontology Nurse Practitioner [facility]

## 2022-05-06 NOTE — Progress Notes (Signed)
Initial Nutrition Assessment  DOCUMENTATION CODES:      INTERVENTION:  Boost Breeze po TID   Recommend least restrictive diet that is safe for patient  NUTRITION DIAGNOSIS:   Inadequate oral intake related to altered GI function (GIB) as evidenced by  (NPO/Clear liquids).   GOAL:  Patient will meet greater than or equal to 90% of their needs   MONITOR:  Diet advancement, Supplement acceptance, PO intake, Weight trends, Labs  REASON FOR ASSESSMENT:   Malnutrition Screening Tool    ASSESSMENT: Patient is a 88 yo female from [facility] with COPD, HTN, GERD, Arthritis, HOH. Presents with GIB. EGD yesterday -4 cm hiatal hernia (grade IV).   Patient diet advanced to clears. Affirms that she is starting to feel hungry and asked when she could get some regular food. Patient reports dairy and banana allergy. Will update nutrition services. Able to feed herself.   Stable weight per history between 75-77 kg the past 13 months. Patient says she is weak and hasn't walked since May.   Medications: reviewed. IVF-NS @ 75 ml/hr.      Latest Ref Rng & Units 05/06/2022    4:29 AM 05/05/2022    4:23 AM 05/04/2022    7:04 PM  BMP  Glucose 70 - 99 mg/dL 633  354  562   BUN 8 - 23 mg/dL 9  10  11    Creatinine 0.44 - 1.00 mg/dL  5.63  8.93   Sodium 135 - 145 mmol/L 135  131  127   Potassium 3.5 - 5.1 mmol/L 3.7  4.1  3.2   Chloride 98 - 111 mmol/L 105  98  94   CO2 22 - 32 mmol/L 24  23  24    Calcium 8.9 - 10.3 mg/dL 8.5  8.7  8.5       NUTRITION - FOCUSED PHYSICAL EXAM:  Flowsheet Row Most Recent Value  Orbital Region No depletion  Upper Arm Region No depletion  Thoracic and Lumbar Region No depletion  Buccal Region No depletion  Temple Region No depletion  Clavicle Bone Region No depletion  Clavicle and Acromion Bone Region Mild depletion  Scapular Bone Region No depletion  Dorsal Hand Moderate depletion  Patellar Region No depletion  Edema (RD Assessment) Mild   Hair Reviewed  Eyes Reviewed  Mouth Reviewed  Skin Reviewed  Nails Reviewed      Diet Order:   Diet Order             Diet clear liquid Room service appropriate? Yes; Fluid consistency: Thin  Diet effective now                   EDUCATION NEEDS:  Education needs have been addressed  Skin:  Skin Assessment: Reviewed RN Assessment  Last BM:  6/13 medium  Height:   Ht Readings from Last 1 Encounters:  05/05/22 5\' 2"  (1.575 m)    Weight:   Wt Readings from Last 1 Encounters:  05/05/22 74.8 kg    Ideal Body Weight:   50 kg   BMI:  Body mass index is 30.16 kg/m.  Estimated Nutritional Needs:   Kcal:  1500-1700  Protein:  75-85 gr  Fluid:  1600 ml daily  05/07/22 MS,RD,CSG,LDN Contact: 

## 2022-05-06 NOTE — Hospital Course (Signed)
88 y.o. year old female with history of sigmoid diverticulitis in May 2023, COPD, GERD,  HTN, hypothyroidism presenting with n/v/d and generalized weakness. She was then hospitalized at [facility] and transferred to [facility] for diarrhea, melena and anemia.   EGD completed at [facility] on 04/21/22 with mildly torturous esophagus, 7 cm hiatal hernia, otherwise normal stomach and duodenum. [facility] has [facility] a [facility] since that time.    Patient presented via EMS yesterday evening for complaints of nausea, frequent stools, fatigue, weakness. She denies any black stool; however, she was heme positive in the ED. Admitting Hgb 8.7 yesterday and today 8.5. Discharge Hgb from [facility] a week ago was 8.9.  Noted frequent stools at that time. Stool was like black oil in May 2023. She does not believe she has had any recent black stool. Main complaints of fatigue, weakness, nausea, difficulty controlling urine and bowel movements. Stool is formed and mixed with water. Manson Passey 6/13. Formed stool this morning, bristol stool scale #4. Feels nauseated with eating/drinking. No appetite. Appetite has been poor for several months. Daughter is at bedside and states patient has significantly declined since hospitalization for diverticulitis. Prior to this, she lived independently.    Prior endoscopic evaluation: EGD Apr 21, 2022: mildly torturous esophagus, 7 cm hiatal hernia, otherwise normal stomach and duodenum.  EGD June 2022: non-obstructing Schatzki ring s/p dilation, 6 cm hiatal hernia, normal stomach, normal duodenum Colonoscopy 2012-diverticulosis sigmoid. Internal hemorrhoids. Per Epic documentation. [facility], Dr. Manfred Shirts.

## 2022-05-06 NOTE — TOC Progression Note (Signed)
Transition of Care [facility]) - Progression Note    Patient Details  Name: Pamela Reed MRN: [id number] Date of Birth: [birth year]  Transition of Care [facility] CM/SW Contact  Karn Cassis, [place] Phone Number: 05/06/2022, 11:12 AM  Clinical Narrative:  Pt's daughter, Benetta Spar states she does not want pt to return to [facility]. Discussed other facilities and she is agreeable to initiate bed search in [place], [place], and [place]. Referral sent.      Expected Discharge Plan: Skilled Nursing Facility Barriers to Discharge: Continued Medical Work up  Expected Discharge Plan and Services Expected Discharge Plan: Skilled Nursing Facility     Post Acute Care Choice: Skilled Nursing Facility Living arrangements for the past 2 months: Single Family Home, Skilled Nursing Facility                                       Social Determinants of Health (SDOH) Interventions    Readmission Risk Interventions     No data to display

## 2022-05-07 ENCOUNTER — Inpatient Hospital Stay (HOSPITAL_COMMUNITY): Payer: Medicare HMO

## 2022-05-07 DIAGNOSIS — D508 Other iron deficiency anemias: Secondary | ICD-10-CM | POA: Diagnosis not present

## 2022-05-07 DIAGNOSIS — R195 Other fecal abnormalities: Secondary | ICD-10-CM | POA: Diagnosis not present

## 2022-05-07 DIAGNOSIS — E876 Hypokalemia: Secondary | ICD-10-CM | POA: Diagnosis not present

## 2022-05-07 DIAGNOSIS — E871 Hypo-osmolality and hyponatremia: Secondary | ICD-10-CM | POA: Diagnosis not present

## 2022-05-07 DIAGNOSIS — D509 Iron deficiency anemia, unspecified: Secondary | ICD-10-CM

## 2022-05-07 LAB — BASIC METABOLIC PANEL
Anion gap: 6 (ref 5–15)
BUN: 5 mg/dL — ABNORMAL LOW (ref 8–23)
CO2: 23 mmol/L (ref 22–32)
Calcium: 8.4 mg/dL — ABNORMAL LOW (ref 8.9–10.3)
Chloride: 106 mmol/L (ref 98–111)
Creatinine, Ser: 0.54 mg/dL (ref 0.44–1.00)
GFR, Estimated: >60 mL/min (ref >60)
Glucose, Bld: 86 mg/dL (ref 70–99)
Potassium: 3.5 mmol/L (ref 3.5–5.1)
Sodium: 135 mmol/L (ref 135–145)

## 2022-05-07 LAB — CBC
HCT: 25.6 % — ABNORMAL LOW (ref 36.0–46.0)
Hemoglobin: 7.9 g/dL — ABNORMAL LOW (ref 12.0–15.0)
MCH: 27.8 pg (ref 26.0–34.0)
MCHC: 30.9 g/dL (ref 30.0–36.0)
MCV: 90.1 fL (ref 80.0–100.0)
Platelets: 494 10*3/uL — ABNORMAL HIGH (ref 150–400)
RBC: 2.84 MIL/uL — ABNORMAL LOW (ref 3.87–5.11)
RDW: 15.8 % — ABNORMAL HIGH (ref 11.5–15.5)
WBC: 8.5 10*3/uL (ref 4.0–10.5)
nRBC: 0 % (ref 0.0–0.2)

## 2022-05-07 LAB — MAGNESIUM: Magnesium: 1.7 mg/dL (ref 1.7–2.4)

## 2022-05-07 MED ORDER — SACCHAROMYCES BOULARDII 250 MG PO CAPS
250.0000 mg | ORAL_CAPSULE | Freq: Two times a day (BID) | ORAL | Status: DC
  Administered 2022-05-07 – 2022-05-12 (×10): 250 mg via ORAL
  Filled 2022-05-07 (×10): qty 1

## 2022-05-07 MED ORDER — SODIUM CHLORIDE 0.9 % IV SOLN
250.0000 mg | Freq: Once | INTRAVENOUS | Status: AC
  Administered 2022-05-07: 250 mg via INTRAVENOUS
  Filled 2022-05-07: qty 20

## 2022-05-07 MED ORDER — MAGNESIUM SULFATE 2 GM/50ML IV SOLN
2.0000 g | Freq: Once | INTRAVENOUS | Status: AC
Start: 2022-05-07 — End: 2022-05-08
  Administered 2022-05-07: 2 g via INTRAVENOUS
  Filled 2022-05-07: qty 50

## 2022-05-07 MED ORDER — POTASSIUM CHLORIDE 10 MEQ/100ML IV SOLN
10.0000 meq | INTRAVENOUS | Status: AC
  Administered 2022-05-07 (×2): 10 meq via INTRAVENOUS
  Filled 2022-05-07 (×2): qty 100

## 2022-05-07 NOTE — Progress Notes (Signed)
PROGRESS NOTE  Pamela Reed WGN:[id number] DOB: May 17, 1931 DOA: 05/04/2022 PCP: Alan Mulder, MD  Brief History:   88 y.o. year old female with history of sigmoid diverticulitis in May 2023, COPD, GERD,  HTN, hypothyroidism presenting with n/v/d and generalized weakness. She was then hospitalized at [facility] and transferred to [facility] for diarrhea, melena and anemia.   EGD completed at [facility] on 04/21/22 with mildly torturous esophagus, 7 cm hiatal hernia, otherwise normal stomach and duodenum. She has been a resident at the [facility] since that time.    Patient presented via EMS yesterday evening for complaints of nausea, frequent stools, fatigue, weakness. She denies any black stool; however, she was heme positive in the ED. Admitting Hgb 8.7 yesterday and today 8.5. Discharge Hgb from [facility] a week ago was 8.9.  Noted frequent stools at that time. Stool was like black oil in May 2023. She does not believe she has had any recent black stool. Main complaints of fatigue, weakness, nausea, difficulty controlling urine and bowel movements. Stool is formed and mixed with water. Manson Passey 6/13. Formed stool this morning, bristol stool scale #4. Feels nauseated with eating/drinking. No appetite. Appetite has been poor for several months. Daughter is at bedside and states patient has significantly declined since hospitalization for diverticulitis. Prior to this, she lived independently.    Prior endoscopic evaluation: EGD Apr 21, 2022: mildly torturous esophagus, 7 cm hiatal hernia, otherwise normal stomach and duodenum.  EGD June 2022: non-obstructing Schatzki ring s/p dilation, 6 cm hiatal hernia, normal stomach, normal duodenum Colonoscopy 2012-diverticulosis sigmoid. Internal hemorrhoids. Per Epic documentation. [facility], Dr. Manfred Shirts.        Assessment and Plan: * Heme positive stool -Holding aspirin -GI consult appreciated -CT abdomen pelvis shows no acute  findings.  Sigmoid diverticulosis.  Moderate size hiatal hernia -6/14 capsule--sm amount blood in stomach due to EGD trauma, no AVMs or obvious source of blood loss -discussed with GI>>defer colonoscopy for now given recent diverticulitis and clinical stability -Continue PPI -Trend CBC  Hyponatremia - Continue normal saline 75 mill per hour>>saline lock -This has been a gradual trend down from the end of May due to volume depletion  Hypokalemia - Replaced in the ED -Recheck in the a.m. along with magnesium--1.8  Hypothyroidism - Continue Synthroid  Anxiety - Continue Xanax and Prozac -PDMP reviewed--xanax 0.25 mg, #18 on 04/15/22 and #60 on 10/27/21  Physical deconditioning - Secondary to multiple hospitalizations -Came to [place] from rehab-[facility] -PT eval and treat>>SNF  Iron deficiency anemia Iron saturation 12%, ferritin 429 nulecit x 1   Thrombocytosis - Acute phase reactant and iron deficiency -Low suspicion for infection at this time -CT abdomen pelvis does not show signs of infection -No leukocytosis     Family Communication:  daughter updated 6/15  Consultants:  GI  Code Status:  DNR  DVT Prophylaxis:  SCDs   Procedures: As Listed in Progress Note Above  Antibiotics: None     Subjective: Patient has some nausea, no emesis.  No diarrhea.  Denies f/c, cp, sob, cough, headache.  She has some intermittent abd pain.  No hematochezia or melena  Objective: Vitals:   05/06/22 2119 05/07/22 0503 05/07/22 0833 05/07/22 1238  BP: (!) 129/57 (!) 128/58  135/68  Pulse: 83 82  82  Resp: 18 18  20   Temp: 98 F (36.7 C) 98.1 F (36.7 C)  98.2 F (36.8 C)  TempSrc: Oral  Oral  SpO2: 93% 94% 96% 97%  Weight:      Height:        Intake/Output Summary (Last 24 hours) at 05/07/2022 1422 Last data filed at 05/07/2022 1238 Gross per 24 hour  Intake 480 ml  Output 710 ml  Net -230 ml   Weight change:  Exam:  General:  Pt is alert,  follows commands appropriately, not in acute distress HEENT: No icterus, No thrush, No neck mass, [place]/AT Cardiovascular: RRR, S1/S2, no rubs, no gallops Respiratory: bibasilar rales. No wheeze Abdomen: Soft/+BS, non tender, non distended, no guarding Extremities: No edema, No lymphangitis, No petechiae, No rashes, no synovitis   Data Reviewed: I have personally reviewed following labs and imaging studies Basic Metabolic Panel: Recent Labs  Lab 05/04/22 1904 05/05/22 0423 05/06/22 0429 05/07/22 0442  NA 127* 131* 135 135  K 3.2* 4.1 3.7 3.5  CL 94* 98 105 106  CO2 24 23 24 23   GLUCOSE 121* 177* 117* 86  BUN 11 10 9  5*  CREATININE 0.63 0.58 0.61 0.54  CALCIUM 8.5* 8.7* 8.5* 8.4*  MG  --  2.0 1.8 1.7   Liver Function Tests: Recent Labs  Lab 05/04/22 1904 05/05/22 0423  AST 39 37  ALT 33 34  ALKPHOS 132* 128*  BILITOT 0.8 0.7  PROT 6.5 6.6  ALBUMIN 2.3* 2.4*   Recent Labs  Lab 05/04/22 1904  LIPASE 32   No results for input(s): "AMMONIA" in the last 168 hours. Coagulation Profile: No results for input(s): "INR", "PROTIME" in the last 168 hours. CBC: Recent Labs  Lab 05/04/22 1904 05/05/22 0423 05/05/22 1313 05/05/22 1719 05/06/22 0509 05/07/22 0442  WBC 10.9* 9.7 12.8* 12.2* 12.3* 8.5  NEUTROABS 7.4  --   --   --   --   --   HGB 8.7* 8.5* 9.1* 7.8* 7.3* 7.9*  HCT 26.7* 26.7* 26.7* 24.5* 23.5* 25.6*  MCV 85.9 87.0 83.4 86.9 90.0 90.1  PLT 532* 515* 581* 524* 498* 494*   Cardiac Enzymes: No results for input(s): "CKTOTAL", "CKMB", "CKMBINDEX", "TROPONINI" in the last 168 hours. BNP: Invalid input(s): "POCBNP" CBG: No results for input(s): "GLUCAP" in the last 168 hours. HbA1C: No results for input(s): "HGBA1C" in the last 72 hours. Urine analysis:    Component Value Date/Time   COLORURINE YELLOW 05/05/2022 0126   APPEARANCEUR CLEAR 05/05/2022 0126   APPEARANCEUR Hazy (A) 10/04/2019 1315   LABSPEC 1.008 05/05/2022 0126   PHURINE 7.0 05/05/2022  0126   GLUCOSEU NEGATIVE 05/05/2022 0126   HGBUR MODERATE (A) 05/05/2022 0126   BILIRUBINUR NEGATIVE 05/05/2022 0126   BILIRUBINUR Negative 10/04/2019 1315   KETONESUR NEGATIVE 05/05/2022 0126   PROTEINUR NEGATIVE 05/05/2022 0126   NITRITE NEGATIVE 05/05/2022 0126   LEUKOCYTESUR SMALL (A) 05/05/2022 0126   Sepsis Labs: @LABRCNTIP (procalcitonin:4,lacticidven:4) ) Recent Results (from the past 240 hour(s))  Urine Culture     Status: Abnormal   Collection Time: 05/05/22  1:26 AM   Specimen: Urine, Clean Catch  Result Value Ref Range Status   Specimen Description   Final    URINE, CLEAN CATCH Performed at [facility], [address] [place], [facility] [address]    Special Requests   Final    NONE Performed at [facility], [address] [place], [facility] [address]    Culture MULTIPLE SPECIES PRESENT, SUGGEST RECOLLECTION (A)  Final   Report Status 05/06/2022 FINAL  Final     Scheduled Meds:  amLODipine  10 mg Oral Daily  feeding supplement  1 Container Oral TID BM   FLUoxetine  10 mg Oral Daily   levothyroxine  50 mcg Oral Q0600   lisinopril  20 mg Oral BID   metoprolol succinate  25 mg Oral BID   mometasone-formoterol  2 puff Inhalation BID   ondansetron (ZOFRAN) IV  4 mg Intravenous Q6H   pantoprazole (PROTONIX) IV  40 mg Intravenous BID   simvastatin  5 mg Oral QHS   Continuous Infusions:  ferric gluconate (FERRLECIT) IVPB     magnesium sulfate bolus IVPB     potassium chloride      Procedures/Studies: DG Abd 1 View  Result Date: 05/07/2022 CLINICAL DATA:  Constipation, abdominal pain EXAM: ABDOMEN - 1 VIEW COMPARISON:  None Available. FINDINGS: Included bowel gas pattern is unremarkable. No significant stool burden. IMPRESSION: Unremarkable bowel gas pattern. Electronically Signed   By: Guadlupe Spanish M.D.   On: 05/07/2022 13:35   CT Abdomen Pelvis W Contrast  Result Date: 05/05/2022 CLINICAL DATA:  Abdominal pain, acute, nonlocalized EXAM: CT ABDOMEN AND PELVIS  WITH CONTRAST TECHNIQUE: Multidetector CT imaging of the abdomen and pelvis was performed using the standard protocol following bolus administration of intravenous contrast. RADIATION DOSE REDUCTION: This exam was performed according to the departmental dose-optimization program which includes automated exposure control, adjustment of the mA and/or kV according to patient size and/or use of iterative reconstruction technique. CONTRAST:  16mL OMNIPAQUE IOHEXOL 300 MG/ML  SOLN COMPARISON:  04/06/2022 FINDINGS: Lower chest: Moderate-sized hiatal hernia.  No acute abnormality. Hepatobiliary: No focal liver abnormality is seen. Status post cholecystectomy. No biliary dilatation. Pancreas: No focal abnormality or ductal dilatation. Spleen: No focal abnormality.  Normal size. Adrenals/Urinary Tract: No adrenal abnormality. No focal renal abnormality. No stones or hydronephrosis. Urinary bladder is unremarkable. Stomach/Bowel: Few scattered sigmoid diverticula. No active diverticulitis. Stomach and small bowel decompressed, unremarkable. Vascular/Lymphatic: Scattered aortic calcifications. No evidence of aneurysm or adenopathy. Reproductive: Prior hysterectomy.  No adnexal masses. Other: No free fluid or free air. Musculoskeletal: No acute bony abnormality. IMPRESSION: No acute findings in the abdomen or pelvis. Sigmoid diverticulosis. Moderate-sized hiatal hernia. Electronically Signed   By: Charlett Nose M.D.   On: 05/05/2022 01:56    Catarina Hartshorn, DO  [facility]  If 7PM-7AM, please contact night-coverage [email or web address] Password [email or web address] 05/07/2022, 2:22 PM   LOS: 3 days

## 2022-05-07 NOTE — TOC Progression Note (Signed)
Transition of Care [facility]) - Progression Note    Patient Details  Name: ANTONIETA SLAVEN MRN: [id number] Date of Birth: 02/25/1931  Transition of Care [facility]) CM/SW Contact  Annice Needy, LCSW Phone Number: 05/07/2022, 11:28 AM  Clinical Narrative:    Bed offers provided to daughter, Ms. Eliot Ford. She will discuss with family and return contact to [facility].    Expected Discharge Plan: Skilled Nursing Facility Barriers to Discharge: Continued Medical Work up  Expected Discharge Plan and Services Expected Discharge Plan: Skilled Nursing Facility     Post Acute Care Choice: Skilled Nursing Facility Living arrangements for the past 2 months: Single Family Home, Skilled Nursing Facility                                       Social Determinants of Health (SDOH) Interventions    Readmission Risk Interventions     No data to display

## 2022-05-07 NOTE — Progress Notes (Addendum)
Gastroenterology Progress Note   Referring Provider: Dr. Audley Hose Primary Care Physician:  Alan Mulder, MD Primary Gastroenterologist:  established at [facility]  Patient ID: Pamela Reed; [id number]; 13-Oct-1931    Subjective   Flexi-seal was placed evening of 6/13 due to reports of loose stools and skin breakdown. No output since flexi-seal was placed. Nurse tech at bedside "stripping" tubing to help release mushy stool.   Nausea has improved but still present. LLQ discomfort has improved but still present. No vomiting. No overt GI bleeding. Feels like she has to have a BM but can't.   Daughter at bedside: does not want colonoscopy this admission. Patient is hesitant as well.    Objective   Vital signs in last 24 hours Temp:  [97.9 F (36.6 C)-98.1 F (36.7 C)] 98.1 F (36.7 C) (06/15 0503) Pulse Rate:  [81-83] 82 (06/15 0503) Resp:  [17-18] 18 (06/15 0503) BP: (118-129)/(54-58) 128/58 (06/15 0503) SpO2:  [93 %-97 %] 96 % (06/15 0833) Last BM Date : 05/05/22  Physical Exam General:   Alert and oriented, frail Head:  Normocephalic and atraumatic. Eyes:  No icterus, sclera clear. Conjuctiva pink.  Abdomen:  Bowel sounds present, soft, mild TTP LLQ , non-distended. No HSM or hernias noted. No rebound or guarding. No masses appreciated  Rectal: flexi-seal in place. No obvious fecal impaction. Difficult to assess with rectal tube present Extremities:  Without  edema. Neurologic:  Alert and  oriented x4   Intake/Output from previous day: 06/14 0701 - 06/15 0700 In: 480 [P.O.:480] Out: 1300 [Urine:1300] Intake/Output this shift: Total I/O In: 240 [P.O.:240] Out: 700 [Urine:700]  Lab Results  Recent Labs    05/05/22 1719 05/06/22 0509 05/07/22 0442  WBC 12.2* 12.3* 8.5  HGB 7.8* 7.3* 7.9*  HCT 24.5* 23.5* 25.6*  PLT 524* 498* 494*   BMET Recent Labs    05/05/22 0423 05/06/22 0429 05/07/22 0442  NA 131* 135 135  K 4.1 3.7 3.5  CL 98 105 106   CO2 23 24 23   GLUCOSE 177* 117* 86  BUN 10 9 5*  CREATININE 0.58 0.61 0.54  CALCIUM 8.7* 8.5* 8.4*   LFT Recent Labs    05/04/22 1904 05/05/22 0423  PROT 6.5 6.6  ALBUMIN 2.3* 2.4*  AST 39 37  ALT 33 34  ALKPHOS 132* 128*  BILITOT 0.8 0.7   Lab Results  Component Value Date   FERRITIN 429 (H) 05/05/2022   Lab Results  Component Value Date   IRON 25 (L) 05/05/2022   TIBC 212 (L) 05/05/2022   FERRITIN 429 (H) 05/05/2022      Studies/Results CT Abdomen Pelvis W Contrast  Result Date: 05/05/2022 CLINICAL DATA:  Abdominal pain, acute, nonlocalized EXAM: CT ABDOMEN AND PELVIS WITH CONTRAST TECHNIQUE: Multidetector CT imaging of the abdomen and pelvis was performed using the standard protocol following bolus administration of intravenous contrast. RADIATION DOSE REDUCTION: This exam was performed according to the departmental dose-optimization program which includes automated exposure control, adjustment of the mA and/or kV according to patient size and/or use of iterative reconstruction technique. CONTRAST:  60mL OMNIPAQUE IOHEXOL 300 MG/ML  SOLN COMPARISON:  04/06/2022 FINDINGS: Lower chest: Moderate-sized hiatal hernia.  No acute abnormality. Hepatobiliary: No focal liver abnormality is seen. Status post cholecystectomy. No biliary dilatation. Pancreas: No focal abnormality or ductal dilatation. Spleen: No focal abnormality.  Normal size. Adrenals/Urinary Tract: No adrenal abnormality. No focal renal abnormality. No stones or hydronephrosis. Urinary bladder is unremarkable. Stomach/Bowel: Few scattered sigmoid  diverticula. No active diverticulitis. Stomach and small bowel decompressed, unremarkable. Vascular/Lymphatic: Scattered aortic calcifications. No evidence of aneurysm or adenopathy. Reproductive: Prior hysterectomy.  No adnexal masses. Other: No free fluid or free air. Musculoskeletal: No acute bony abnormality. IMPRESSION: No acute findings in the abdomen or pelvis. Sigmoid  diverticulosis. Moderate-sized hiatal hernia. Electronically Signed   By: Charlett Nose M.D.   On: 05/05/2022 01:56    Assessment  88 y.o. female with a history of sigmoid diverticulitis Apr 06, 2022 s/p treatment with Cipro and Flagyl and discharged to SNF then readmitted to [facility] end of May 2023 with concerns for dark stool and drop in Hgb, s/p EGD without obvious source of bleeding, presenting this admission from the [facility] with reports of persistent nausea, fatigue, weakness, and frequent BMs. GI consulted due to heme positive stool and anemia.   Anemia: Hgb 8.7 on admission, which was similar to Hgb at [facility] at time of discharge; however, her Hgb was 11/12 range early May 2023 at [facility]. EGD with capsule placement performed this admission with findings of 4 cm hiatal hernia. Capsule with no source of bleeding lesion. Hgb today is 7.9, but she has had no overt GI bleeding. Colonoscopy can be undertaken as outpatient, as she is just now 4 weeks from episode of diverticulitis. Patient and daughter would like to pursue this as outpatient.   Frequent stools: reported multiple loose stools prior to admission; however, I personally saw formed stool at time of consultation. Nursing notes states frequent, watery stools thereafter but no GI pathogen panel was collected although ordered. Although she is at risk for Cdiff in light of recent antibiotic exposure, I doubt this is the case as she has had more consistency to stools. In fact, with flexi-seal in place, she had no output since yesterday. I would suspect if watery diarrhea, would be in tube or even coming out around the tube. DRE without obvious fecal impaction. Patient notes sensation of feeling full and needing to have BM. 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 Abdominal xray to assess stool burden. Can consider Imodium thereafter. Would avoid  dicyclomine due to age.   Nausea: improved since admission. Appropriate to advance diet. Unclear etiology at this point. Continue with PPI BID, Zofran prn.    Overall, she has had a rapid decline in functional status since first hospitalization in May 2023 for diverticulitis. I spoke with daughter at length today regarding patient's presentation. Consider getting palliative care involved. Daughter is open to this.    Plan / Recommendations  Abdominal xray Continue PPI BID Continue Zofran for supportive measures Would recommend outpatient colonoscopy Appropriate to advance diet to soft foods    LOS: 3 days    05/07/2022, 12:07 PM  Gelene Mink, PhD, ANP-BC [facility] Gastroenterology   Addendum at 1400: Abdominal xray without significant stool burden. 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 She does have a history of diarrhea in the past after review of outside notes at the [facility] (last seen June 2022). Recommend supportive measures and colonoscopy as outpatient.  Gelene Mink, PhD, ANP-BC [facility] Gastroenterology

## 2022-05-07 NOTE — Assessment & Plan Note (Addendum)
Iron saturation 12%, ferritin 429 nulecit x 1 on 6/15 nulecit 125 mg x 1 on 6/17 Hgb 8.8 on day of discharge --Hgb improving/stable --Hgb 8.4 on day of d/c

## 2022-05-08 ENCOUNTER — Encounter (HOSPITAL_COMMUNITY): Payer: Self-pay | Admitting: Gastroenterology

## 2022-05-08 ENCOUNTER — Telehealth: Payer: Self-pay | Admitting: Gastroenterology

## 2022-05-08 DIAGNOSIS — E871 Hypo-osmolality and hyponatremia: Secondary | ICD-10-CM | POA: Diagnosis not present

## 2022-05-08 DIAGNOSIS — D508 Other iron deficiency anemias: Secondary | ICD-10-CM | POA: Diagnosis not present

## 2022-05-08 DIAGNOSIS — R5381 Other malaise: Secondary | ICD-10-CM | POA: Diagnosis not present

## 2022-05-08 DIAGNOSIS — R195 Other fecal abnormalities: Secondary | ICD-10-CM | POA: Diagnosis not present

## 2022-05-08 LAB — CBC
HCT: 27.3 % — ABNORMAL LOW (ref 36.0–46.0)
Hemoglobin: 8.3 g/dL — ABNORMAL LOW (ref 12.0–15.0)
MCH: 27.5 pg (ref 26.0–34.0)
MCHC: 30.4 g/dL (ref 30.0–36.0)
MCV: 90.4 fL (ref 80.0–100.0)
Platelets: 492 10*3/uL — ABNORMAL HIGH (ref 150–400)
RBC: 3.02 MIL/uL — ABNORMAL LOW (ref 3.87–5.11)
RDW: 16 % — ABNORMAL HIGH (ref 11.5–15.5)
WBC: 8.7 10*3/uL (ref 4.0–10.5)
nRBC: 0 % (ref 0.0–0.2)

## 2022-05-08 LAB — BASIC METABOLIC PANEL
Anion gap: 5 (ref 5–15)
BUN: 5 mg/dL — ABNORMAL LOW (ref 8–23)
CO2: 23 mmol/L (ref 22–32)
Calcium: 8.4 mg/dL — ABNORMAL LOW (ref 8.9–10.3)
Chloride: 108 mmol/L (ref 98–111)
Creatinine, Ser: 0.56 mg/dL (ref 0.44–1.00)
GFR, Estimated: >60 mL/min (ref >60)
Glucose, Bld: 101 mg/dL — ABNORMAL HIGH (ref 70–99)
Potassium: 3.5 mmol/L (ref 3.5–5.1)
Sodium: 136 mmol/L (ref 135–145)

## 2022-05-08 LAB — MAGNESIUM: Magnesium: 2.1 mg/dL (ref 1.7–2.4)

## 2022-05-08 NOTE — Progress Notes (Signed)
Gastroenterology Progress Note   Referring Provider: No ref. provider found Primary Care Physician:  Alan Mulder, MD Primary Gastroenterologist:  Dr. Clydene Pugh [name])  Patient ID: Pamela Reed; [id number]; [birth year]    Subjective   Patient has been resting well today states her diarrhea has been much improved.  She denies any abdominal pain, nausea, or vomiting with her lunch today.   Objective   Vital signs in last 24 hours Temp:  [98.1 F (36.7 C)-98.4 F (36.9 C)] 98.4 F (36.9 C) (06/16 1400) Pulse Rate:  [72-84] 77 (06/16 1400) Resp:  [19-20] 20 (06/16 1400) BP: (132-137)/(57-60) 132/60 (06/16 1400) SpO2:  [91 %-97 %] 95 % (06/16 1400) Last BM Date : 05/07/22  Physical Exam General:   Alert and oriented, pleasant Head:  Normocephalic and atraumatic. Eyes:  No icterus, sclera clear. Conjuctiva pink.  Mouth:  Without lesions, mucosa pink and moist.  Neck:  Supple, without thyromegaly or masses.  Heart:  S1, S2 present, no murmurs noted.  Lungs: Clear to auscultation bilaterally, without wheezing, rales, or rhonchi.  Abdomen:  Bowel sounds present, soft, non-tender, non-distended. No HSM or hernias noted. No rebound or guarding. No masses appreciated  Msk:  Symmetrical without gross deformities. Normal posture. Pulses:  Normal pulses noted. Extremities:  Without clubbing or edema. Neurologic:  Alert and  oriented x4;  grossly normal neurologically. Skin:  Warm and dry, intact without significant lesions.  Cervical Nodes:  No significant cervical adenopathy. Psych:  Alert and cooperative. Normal mood and affect.  Intake/Output from previous day: 06/15 0701 - 06/16 0700 In: 4484.9 [P.O.:630; I.V.:3673; IV Piggyback:182] Out: 1160 [Urine:1150; Stool:10] Intake/Output this shift: Total I/O In: 270 [P.O.:270] Out: 200 [Urine:200]  Lab Results  Recent Labs    05/06/22 0509 05/07/22 0442 05/08/22 0412  WBC 12.3* 8.5 8.7  HGB 7.3* 7.9* 8.3*   HCT 23.5* 25.6* 27.3*  PLT 498* 494* 492*   BMET Recent Labs    05/06/22 0429 05/07/22 0442 05/08/22 0412  NA 135 135 136  K 3.7 3.5 3.5  CL 105 106 108  CO2 24 23 23   GLUCOSE 117* 86 101*  BUN 9 5* 5*  CREATININE 0.61 0.54 0.56  CALCIUM 8.5* 8.4* 8.4*   LFT No results for input(s): "PROT", "ALBUMIN", "AST", "ALT", "ALKPHOS", "BILITOT", "BILIDIR", "IBILI" in the last 72 hours. PT/INR No results for input(s): "LABPROT", "INR" in the last 72 hours. Hepatitis Panel No results for input(s): "HEPBSAG", "HCVAB", "HEPAIGM", "HEPBIGM" in the last 72 hours.   Studies/Results DG Abd 1 View  Result Date: 05/07/2022 CLINICAL DATA:  Constipation, abdominal pain EXAM: ABDOMEN - 1 VIEW COMPARISON:  None Available. FINDINGS: Included bowel gas pattern is unremarkable. No significant stool burden. IMPRESSION: Unremarkable bowel gas pattern. Electronically Signed   By: 05/09/2022 M.D.   On: 05/07/2022 13:35   CT Abdomen Pelvis W Contrast  Result Date: 05/05/2022 CLINICAL DATA:  Abdominal pain, acute, nonlocalized EXAM: CT ABDOMEN AND PELVIS WITH CONTRAST TECHNIQUE: Multidetector CT imaging of the abdomen and pelvis was performed using the standard protocol following bolus administration of intravenous contrast. RADIATION DOSE REDUCTION: This exam was performed according to the departmental dose-optimization program which includes automated exposure control, adjustment of the mA and/or kV according to patient size and/or use of iterative reconstruction technique. CONTRAST:  31mL OMNIPAQUE IOHEXOL 300 MG/ML  SOLN COMPARISON:  04/06/2022 FINDINGS: Lower chest: Moderate-sized hiatal hernia.  No acute abnormality. Hepatobiliary: No focal liver abnormality is seen. Status post cholecystectomy. No  biliary dilatation. Pancreas: No focal abnormality or ductal dilatation. Spleen: No focal abnormality.  Normal size. Adrenals/Urinary Tract: No adrenal abnormality. No focal renal abnormality. No stones or  hydronephrosis. Urinary bladder is unremarkable. Stomach/Bowel: Few scattered sigmoid diverticula. No active diverticulitis. Stomach and small bowel decompressed, unremarkable. Vascular/Lymphatic: Scattered aortic calcifications. No evidence of aneurysm or adenopathy. Reproductive: Prior hysterectomy.  No adnexal masses. Other: No free fluid or free air. Musculoskeletal: No acute bony abnormality. IMPRESSION: No acute findings in the abdomen or pelvis. Sigmoid diverticulosis. Moderate-sized hiatal hernia. Electronically Signed   By: Charlett Nose M.D.   On: 05/05/2022 01:56    Assessment  88 y.o. female with a history of sigmoid diverticulitis on 04/06/2022 s/p treatment with Cipro and Flagyl and discharged to SNF then readmitted to [facility] the end of May 2023 with concerns for dark stool and drop in hemoglobin, s/p EGD with out obvious source of bleeding, presenting this admission from [facility] with reports of persistent nausea, fatigue, weakness, and frequent BMs.  GI consulted due to heme positive stool and anemia.  Anemia: Hemoglobin 8.7 on admission, similar to hemoglobin at [facility] at time of discharge.  Hemoglobin was in the 11/12 range in early May 2023.  EGD with capsule placement performed this admission with findings of 4 cm hiatal hernia, capsule without any source of bleeding lesion.  Hemoglobin today 8.3 up from 7.9.  We will need to pursue colonoscopy as outpatient to give plenty of time to recover from diverticulitis, patient is in agreement with this plan.  Frequent stools: Multiple loose stools reported prior to admission.  Lewie Loron, NP saw patient at time of original consultation.  Nursing stated she had frequent watery stools thereafter but GI pathogen panel was not collected.  She is at risk for C. difficile given recent antibiotics pressure however it was reported also by Lewie Loron, NP that when seen yesterday she had more consistency in her stools.  She completed DRE without obvious fecal  impaction.  Abdominal x-ray without evidence of stool burden.  Patient states that she has had less frequent bowel movements over the last 24 hours.  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  Not a candidate for dicyclomine due to her age.  Continue to recommend Imodium as needed and daily probiotic.  Nausea: Patient denied any with her meals today.  Etiology was unclear but likely due to recent infection and antibiotics.  Continue PPI twice daily and Zofran as needed.  Patient will likely be discharged back to SNF for short-term rehab.  Recommendation per physical therapy.  Plan / Recommendations  Continue PPI twice daily Continue Zofran as needed Continue supportive measures Continue current diet Follow-up with primary gastroenterologist [name]) for follow-up colonoscopy    LOS: 4 days    05/08/2022, 3:10 PM   Brooke Bonito, MSN, FNP-BC, AGACNP-BC [facility]

## 2022-05-08 NOTE — Progress Notes (Signed)
Physical Therapy Treatment Patient Details Name: Pamela Reed MRN: [id number] DOB: June 04, 1931 Today's Date: 05/08/2022   History of Present Illness Pamela Reed is a 88 y.o. female with medical history significant of with history of COPD, GERD, hypertension, hard of hearing with hearing aid, hypothyroidism, mixed urinary incontinence, and recent hospitalizations for diarrhea/GI bleed the, and more presents the ED with a chief complaint of generalized weakness.  Patient reports that this has been a long course.  She reports that in May she had been admitted to [facility] for nausea vomiting and diarrhea.  They gave her antibiotics and medication for nausea and sent her home with home health.  Apparently she did not have help 1 night when she needed to get up to go to the bathroom and she fell and could not stand back up.  She went to [facility] in [place] at that time.  She was having diarrhea and melena at that time.  She describes her bowel movements is looking like black oil.  She thinks she was again given some antibiotics.  An upper GI was done at the end of May.  She was again discharged to rehab.  Patient reports that she has only gotten worse.  She has become so weak that she can barely handle any of her ADLs.  She reports she is having diarrhea 4-5 times per day.  She is not sure anymore if that is melena or hematochezia or just liquid, as she has not been looking at it.  She reports she has nausea and vomiting every time she eats.  She also has left lower quadrant pain that feels like a cramping.  Patient also complains of early satiety, and subjective fevers.  She says that she has shortness of breath but she describes it as being so fatigued that she feels like she has to take a break, especially when chewing-which seems to be her main exertion for the day.  Patient reports that she has dysuria but it has improved from previous hospitalization.  She has no other complaints at this time.     PT Comments    Patient with slow, labored mobility requiring assist due to weakness. She demonstrates fair sitting balance and tolerance at EOB. Patient transfers to standing 3x with assist and use of RW. She is limited to a few very small, foot sliding lateral steps at EOB. Patient assisted to sidelying at end of session due to buttock pain. Patient will benefit from continued skilled physical therapy in hospital and recommended venue below to increase strength, balance, endurance for safe ADLs and gait.   Recommendations for follow up therapy are one component of a multi-disciplinary discharge planning process, led by the attending physician.  Recommendations may be updated based on patient status, additional functional criteria and insurance authorization.  Follow Up Recommendations  Skilled nursing-short term rehab (<3 hours/day)     Assistance Recommended at Discharge Frequent or constant Supervision/Assistance  Patient can return home with the following A lot of help with walking and/or transfers;A lot of help with bathing/dressing/bathroom;Assist for transportation;Assistance with cooking/housework;Help with stairs or ramp for entrance;Direct supervision/assist for financial management   Equipment Recommendations       Recommendations for Other Services       Precautions / Restrictions Precautions Precautions: Fall Restrictions Weight Bearing Restrictions: No     Mobility  Bed Mobility Overal bed mobility: Needs Assistance Bed Mobility: Supine to Sit, Sit to Sidelying     Supine to sit: Mod assist  Sit to sidelying: Min assist, Mod assist General bed mobility comments: assist for trunk with supine to sit; assist for trunk and LEs with sit to sidelying, intermittent cueing throughout    Transfers Overall transfer level: Needs assistance Equipment used: Rolling walker (2 wheels) Transfers: Sit to/from Stand Sit to Stand: Mod assist           General transfer  comment: STS with RW x3 with cueing and mod assist for weakness    Ambulation/Gait Ambulation/Gait assistance: Mod assist Gait Distance (Feet): 1 Feet Assistive device: Rolling walker (2 wheels)         General Gait Details: limited to a few small side steps sliding feet on ground   Stairs             Wheelchair Mobility    Modified Rankin (Stroke Patients Only)       Balance Overall balance assessment: Needs assistance, History of Falls Sitting-balance support: Feet supported, Single extremity supported Sitting balance-Leahy Scale: Fair Sitting balance - Comments: seated at edge of bed   Standing balance support: Single extremity supported, During functional activity, Reliant on assistive device for balance Standing balance-Leahy Scale: Poor Standing balance comment: BUE support on RW & mod assist                            Cognition Arousal/Alertness: Awake/alert Behavior During Therapy: WFL for tasks assessed/performed Overall Cognitive Status: Within Functional Limits for tasks assessed                                          Exercises      General Comments        Pertinent Vitals/Pain Pain Assessment Pain Score: 10-Worst pain ever Pain Location: buttocks from raw skin Pain Descriptors / Indicators: Crying, Grimacing, Moaning Pain Intervention(s): Limited activity within patient's tolerance, Monitored during session, Repositioned    Home Living                          Prior Function            PT Goals (current goals can now be found in the care plan section) Acute Rehab PT Goals Patient Stated Goal: get better PT Goal Formulation: With patient/family Time For Goal Achievement: 05/19/22 Potential to Achieve Goals: Fair Progress towards PT goals: Progressing toward goals    Frequency    Min 2X/week      PT Plan Current plan remains appropriate    Co-evaluation              AM-PAC  PT "6 Clicks" Mobility   Outcome Measure  Help needed turning from your back to your side while in a flat bed without using bedrails?: A Little Help needed moving from lying on your back to sitting on the side of a flat bed without using bedrails?: A Lot Help needed moving to and from a bed to a chair (including a wheelchair)?: Total Help needed standing up from a chair using your arms (e.g., wheelchair or bedside chair)?: Total Help needed to walk in hospital room?: Total Help needed climbing 3-5 steps with a railing? : Total 6 Click Score: 9    End of Session   Activity Tolerance: Patient limited by fatigue;Patient limited by pain Patient left: in bed;with call bell/phone within reach;with  family/visitor present Nurse Communication: Mobility status PT Visit Diagnosis: Unsteadiness on feet (R26.81);Difficulty in walking, not elsewhere classified (R26.2);Muscle weakness (generalized) (M62.81);History of falling (Z91.81)     Time: 3299-2426 PT Time Calculation (min) (ACUTE ONLY): 22 min  Charges:  $Therapeutic Activity: 8-22 mins                    12:05 PM, 05/08/22 Wyman Songster PT, DPT Physical Therapist at [facility]

## 2022-05-08 NOTE — Anesthesia Postprocedure Evaluation (Signed)
Anesthesia Post Note  Patient: JEWELL HAUGHT  Procedure(s) Performed: ESOPHAGOGASTRODUODENOSCOPY (EGD) WITH PROPOFOL GIVENS CAPSULE STUDY  Patient location during evaluation: Phase II Anesthesia Type: General Level of consciousness: awake Pain management: pain level controlled Vital Signs Assessment: post-procedure vital signs reviewed and stable Respiratory status: spontaneous breathing and respiratory function stable Cardiovascular status: blood pressure returned to baseline and stable Postop Assessment: no headache and no apparent nausea or vomiting Anesthetic complications: no Comments: Late entry   No notable events documented.   Last Vitals:  Vitals:   05/08/22 0421 05/08/22 0730  BP: (!) 135/57   Pulse: 72   Resp: 19   Temp: 36.7 C   SpO2: 97% 96%    Last Pain:  Vitals:   05/08/22 0715  TempSrc:   PainSc: 0-No pain                 Windell Norfolk

## 2022-05-08 NOTE — Progress Notes (Signed)
PROGRESS NOTE  Pamela Reed MLY:[id number] DOB: 05/29/1931 DOA: 05/04/2022 PCP: Alan Mulder, MD  Brief History:   88 y.o. year old female with history of sigmoid diverticulitis in May 2023, COPD, GERD,  HTN, hypothyroidism presenting with n/v/d and generalized weakness. She was then hospitalized at [facility] and transferred to [facility] for diarrhea, melena and anemia.   EGD completed at [facility] on 04/21/22 with mildly torturous esophagus, 7 cm hiatal hernia, otherwise normal stomach and duodenum. She has been a resident at the [facility] since that time.    Patient presented via EMS yesterday evening for complaints of nausea, frequent stools, fatigue, weakness. She denies any black stool; however, she was heme positive in the ED. Admitting Hgb 8.7 yesterday and today 8.5. Discharge Hgb from [facility] a week ago was 8.9.  Noted frequent stools at that time. Stool was like black oil in May 2023. She does not believe she has had any recent black stool. Main complaints of fatigue, weakness, nausea, difficulty controlling urine and bowel movements. Stool is formed and mixed with water. Manson Passey 6/13. Formed stool this morning, bristol stool scale #4. Feels nauseated with eating/drinking. No appetite. Appetite has been poor for several months. Daughter is at bedside and states patient has significantly declined since hospitalization for diverticulitis. Prior to this, she lived independently.    Prior endoscopic evaluation: EGD Apr 21, 2022: mildly torturous esophagus, 7 cm hiatal hernia, otherwise normal stomach and duodenum.  EGD June 2022: non-obstructing Schatzki ring s/p dilation, 6 cm hiatal hernia, normal stomach, normal duodenum Colonoscopy 2012-diverticulosis sigmoid. Internal hemorrhoids. Per Epic documentation. [facility], Dr. Manfred Shirts.       Assessment and Plan: * Heme positive stool -Holding aspirin -GI consult appreciated -CT abdomen pelvis shows no acute  findings.  Sigmoid diverticulosis.  Moderate size hiatal hernia -6/14 capsule--sm amount blood in stomach due to EGD trauma, no AVMs or obvious source of blood loss -discussed with GI>>defer colonoscopy for now given recent diverticulitis and clinical stability -Continue PPI -Trend CBC>>Hgb stable 6/15--nulecit 250 mg x 1 -6/15 KUB>>no significant stool burden  Hyponatremia - Continue normal saline 75 mill per hour>>saline lock -This has been a gradual trend down from the end of May due to volume depletion -now improved with IVF   Hypokalemia - Replaced -Recheck in the a.m. along with magnesium--2.1  Hypothyroidism - Continue Synthroid  Anxiety - Continue Xanax and Prozac -PDMP reviewed--xanax 0.25 mg, #18 on 04/15/22 and #60 on 10/27/21  Physical deconditioning - Secondary to multiple hospitalizations -Came to [place] from rehab-[facility] -PT eval and treat>>SNF  Iron deficiency anemia Iron saturation 12%, ferritin 429 nulecit x 1 on 6/15   Thrombocytosis - Acute phase reactant and iron deficiency -Low suspicion for infection at this time -CT abdomen pelvis does not show signs of infection -No leukocytosis   Family Communication:  daughter updated 6/16   Consultants:  GI   Code Status:  DNR   DVT Prophylaxis:  SCDs     Procedures: As Listed in Progress Note Above   Antibiotics: None       Subjective: Patient denies fevers, chills, headache, chest pain, dyspnea, nausea, vomiting, diarrhea, abdominal pain, dysuria, hematuria, hematochezia, and melena.   Objective: Vitals:   05/07/22 2125 05/08/22 0421 05/08/22 0730 05/08/22 1400  BP: (!) 137/57 (!) 135/57  132/60  Pulse: 84 72  77  Resp: 19 19  20   Temp: 98.1 F (36.7 C) 98.1  F (36.7 C)  98.4 F (36.9 C)  TempSrc:    Oral  SpO2: 91% 97% 96% 95%  Weight:      Height:        Intake/Output Summary (Last 24 hours) at 05/08/2022 1737 Last data filed at 05/08/2022 1300 Gross per 24  hour  Intake 4124.94 ml  Output 650 ml  Net 3474.94 ml   Weight change:  Exam:  General:  Pt is alert, follows commands appropriately, not in acute distress HEENT: No icterus, No thrush, No neck mass, /AT Cardiovascular: RRR, S1/S2, no rubs, no gallops Respiratory: fine bibasilar rales. No wheeze Abdomen: Soft/+BS, non tender, non distended, no guarding Extremities: No edema, No lymphangitis, No petechiae, No rashes, no synovitis   Data Reviewed: I have personally reviewed following labs and imaging studies Basic Metabolic Panel: Recent Labs  Lab 05/04/22 1904 05/05/22 0423 05/06/22 0429 05/07/22 0442 05/08/22 0412  NA 127* 131* 135 135 136  K 3.2* 4.1 3.7 3.5 3.5  CL 94* 98 105 106 108  CO2 24 23 24 23 23   GLUCOSE 121* 177* 117* 86 101*  BUN 11 10 9  5* 5*  CREATININE 0.63 0.58 0.61 0.54 0.56  CALCIUM 8.5* 8.7* 8.5* 8.4* 8.4*  MG  --  2.0 1.8 1.7 2.1   Liver Function Tests: Recent Labs  Lab 05/04/22 1904 05/05/22 0423  AST 39 37  ALT 33 34  ALKPHOS 132* 128*  BILITOT 0.8 0.7  PROT 6.5 6.6  ALBUMIN 2.3* 2.4*   Recent Labs  Lab 05/04/22 1904  LIPASE 32   No results for input(s): "AMMONIA" in the last 168 hours. Coagulation Profile: No results for input(s): "INR", "PROTIME" in the last 168 hours. CBC: Recent Labs  Lab 05/04/22 1904 05/05/22 0423 05/05/22 1313 05/05/22 1719 05/06/22 0509 05/07/22 0442 05/08/22 0412  WBC 10.9*   < > 12.8* 12.2* 12.3* 8.5 8.7  NEUTROABS 7.4  --   --   --   --   --   --   HGB 8.7*   < > 9.1* 7.8* 7.3* 7.9* 8.3*  HCT 26.7*   < > 26.7* 24.5* 23.5* 25.6* 27.3*  MCV 85.9   < > 83.4 86.9 90.0 90.1 90.4  PLT 532*   < > 581* 524* 498* 494* 492*   < > = values in this interval not displayed.   Cardiac Enzymes: No results for input(s): "CKTOTAL", "CKMB", "CKMBINDEX", "TROPONINI" in the last 168 hours. BNP: Invalid input(s): "POCBNP" CBG: No results for input(s): "GLUCAP" in the last 168 hours. HbA1C: No results for  input(s): "HGBA1C" in the last 72 hours. Urine analysis:    Component Value Date/Time   COLORURINE YELLOW 05/05/2022 0126   APPEARANCEUR CLEAR 05/05/2022 0126   APPEARANCEUR Hazy (A) 10/04/2019 1315   LABSPEC 1.008 05/05/2022 0126   PHURINE 7.0 05/05/2022 0126   GLUCOSEU NEGATIVE 05/05/2022 0126   HGBUR MODERATE (A) 05/05/2022 0126   BILIRUBINUR NEGATIVE 05/05/2022 0126   BILIRUBINUR Negative 10/04/2019 1315   KETONESUR NEGATIVE 05/05/2022 0126   PROTEINUR NEGATIVE 05/05/2022 0126   NITRITE NEGATIVE 05/05/2022 0126   LEUKOCYTESUR SMALL (A) 05/05/2022 0126   Sepsis Labs: @LABRCNTIP (procalcitonin:4,lacticidven:4) ) Recent Results (from the past 240 hour(s))  Urine Culture     Status: Abnormal   Collection Time: 05/05/22  1:26 AM   Specimen: Urine, Clean Catch  Result Value Ref Range Status   Specimen Description   Final    URINE, CLEAN CATCH Performed at [facility], [address], [place], [place] [address]    Special Requests   Final    NONE Performed at [facility], [address], [place], [place] [address]    Culture MULTIPLE SPECIES PRESENT, SUGGEST RECOLLECTION (A)  Final   Report Status 05/06/2022 FINAL  Final     Scheduled Meds:  amLODipine  10 mg Oral Daily   feeding supplement  1 Container Oral TID BM   FLUoxetine  10 mg Oral Daily   levothyroxine  50 mcg Oral Q0600   lisinopril  20 mg Oral BID   metoprolol succinate  25 mg Oral BID   mometasone-formoterol  2 puff Inhalation BID   ondansetron (ZOFRAN) IV  4 mg Intravenous Q6H   pantoprazole (PROTONIX) IV  40 mg Intravenous BID   saccharomyces boulardii  250 mg Oral BID   simvastatin  5 mg Oral QHS   Continuous Infusions:  Procedures/Studies: DG Abd 1 View  Result Date: 05/07/2022 CLINICAL DATA:  Constipation, abdominal pain EXAM: ABDOMEN - 1 VIEW COMPARISON:  None Available. FINDINGS: Included bowel gas pattern is unremarkable. No significant stool burden. IMPRESSION: Unremarkable bowel gas pattern.  Electronically Signed   By: Guadlupe Spanish M.D.   On: 05/07/2022 13:35   CT Abdomen Pelvis W Contrast  Result Date: 05/05/2022 CLINICAL DATA:  Abdominal pain, acute, nonlocalized EXAM: CT ABDOMEN AND PELVIS WITH CONTRAST TECHNIQUE: Multidetector CT imaging of the abdomen and pelvis was performed using the standard protocol following bolus administration of intravenous contrast. RADIATION DOSE REDUCTION: This exam was performed according to the departmental dose-optimization program which includes automated exposure control, adjustment of the mA and/or kV according to patient size and/or use of iterative reconstruction technique. CONTRAST:  49mL OMNIPAQUE IOHEXOL 300 MG/ML  SOLN COMPARISON:  04/06/2022 FINDINGS: Lower chest: Moderate-sized hiatal hernia.  No acute abnormality. Hepatobiliary: No focal liver abnormality is seen. Status post cholecystectomy. No biliary dilatation. Pancreas: No focal abnormality or ductal dilatation. Spleen: No focal abnormality.  Normal size. Adrenals/Urinary Tract: No adrenal abnormality. No focal renal abnormality. No stones or hydronephrosis. Urinary bladder is unremarkable. Stomach/Bowel: Few scattered sigmoid diverticula. No active diverticulitis. Stomach and small bowel decompressed, unremarkable. Vascular/Lymphatic: Scattered aortic calcifications. No evidence of aneurysm or adenopathy. Reproductive: Prior hysterectomy.  No adnexal masses. Other: No free fluid or free air. Musculoskeletal: No acute bony abnormality. IMPRESSION: No acute findings in the abdomen or pelvis. Sigmoid diverticulosis. Moderate-sized hiatal hernia. Electronically Signed   By: Charlett Nose M.D.   On: 05/05/2022 01:56    Catarina Hartshorn, DO  [facility]  If 7PM-7AM, please contact night-coverage [email or web address] Password [facility] 05/08/2022, 5:37 PM   LOS: 4 days

## 2022-05-08 NOTE — Telephone Encounter (Signed)
Error

## 2022-05-08 NOTE — Care Management Important Message (Signed)
Important Message  Patient Details  Name: Pamela Reed MRN: [id number] Date of Birth: January 20, 1931   Medicare Important Message Given:  Yes (spoke with daughter Francetta Found at [phone] to review letter, copy will be mailed due to contact precautions)     Corey Harold 05/08/2022, 11:34 AM

## 2022-05-09 DIAGNOSIS — R195 Other fecal abnormalities: Secondary | ICD-10-CM | POA: Diagnosis not present

## 2022-05-09 DIAGNOSIS — E871 Hypo-osmolality and hyponatremia: Secondary | ICD-10-CM | POA: Diagnosis not present

## 2022-05-09 DIAGNOSIS — D508 Other iron deficiency anemias: Secondary | ICD-10-CM | POA: Diagnosis not present

## 2022-05-09 DIAGNOSIS — R5381 Other malaise: Secondary | ICD-10-CM | POA: Diagnosis not present

## 2022-05-09 LAB — CBC
HCT: 28 % — ABNORMAL LOW (ref 36.0–46.0)
Hemoglobin: 8.8 g/dL — ABNORMAL LOW (ref 12.0–15.0)
MCH: 27.6 pg (ref 26.0–34.0)
MCHC: 31.4 g/dL (ref 30.0–36.0)
MCV: 87.8 fL (ref 80.0–100.0)
Platelets: 471 10*3/uL — ABNORMAL HIGH (ref 150–400)
RBC: 3.19 MIL/uL — ABNORMAL LOW (ref 3.87–5.11)
RDW: 15.9 % — ABNORMAL HIGH (ref 11.5–15.5)
WBC: 9.4 10*3/uL (ref 4.0–10.5)
nRBC: 0 % (ref 0.0–0.2)

## 2022-05-09 MED ORDER — SODIUM CHLORIDE 0.9 % IV SOLN
125.0000 mg | Freq: Once | INTRAVENOUS | Status: AC
  Administered 2022-05-09: 125 mg via INTRAVENOUS
  Filled 2022-05-09: qty 10

## 2022-05-09 MED ORDER — FERROUS SULFATE 325 (65 FE) MG PO TABS
325.0000 mg | ORAL_TABLET | Freq: Every day | ORAL | Status: DC
  Administered 2022-05-10 – 2022-05-12 (×3): 325 mg via ORAL
  Filled 2022-05-09 (×3): qty 1

## 2022-05-09 MED ORDER — PANTOPRAZOLE SODIUM 40 MG PO TBEC: 40.0000 mg | DELAYED_RELEASE_TABLET | Freq: Two times a day (BID) | ORAL | Status: DC

## 2022-05-09 MED ORDER — PANTOPRAZOLE SODIUM 40 MG PO TBEC
40.0000 mg | DELAYED_RELEASE_TABLET | Freq: Two times a day (BID) | ORAL | Status: DC
  Administered 2022-05-09 – 2022-05-12 (×6): 40 mg via ORAL
  Filled 2022-05-09 (×6): qty 1

## 2022-05-09 MED ORDER — ALPRAZOLAM 0.25 MG PO TABS: 0.2500 mg | ORAL_TABLET | Freq: Two times a day (BID) | ORAL | 0 refills | Status: DC | PRN

## 2022-05-09 MED ORDER — FERROUS SULFATE 325 (65 FE) MG PO TABS: 325.0000 mg | ORAL_TABLET | Freq: Every day | ORAL | 3 refills | Status: AC

## 2022-05-09 NOTE — Progress Notes (Signed)
Nursing Progress Note   Patient had a slight bit of nausea after taking a few bites of her dinner. Provided scheduled zofran. Family at bedside, very supportive. Patient readjusted in bed.   Vitals:   05/09/22 0741 05/09/22 1417  BP:  135/61  Pulse:  92  Resp:  20  Temp:  98.6 F (37 C)  SpO2: 96% 97%    Luther Bradley RN, BSN 05/09/2022 6:43 PM

## 2022-05-09 NOTE — TOC Progression Note (Addendum)
Transition of Care [facility]) - Progression Note    Patient Details  Name: [name] MRN: [id number] Date of Birth: 06-03-1931  Transition of Care [facility]) CM/SW Contact  Lorri Frederick, LCSW Phone Number: 05/09/2022, 11:06 AM  Clinical Narrative:   CSW spoke with Eunice Blase at [facility].  She cannot see this pt in her insurance dashboard.  She has a call in to [facility] to check the status of auth.    1330: TC Debbie, she has not received any communication from [facility].  She does not have auth at this time.    Expected Discharge Plan: Skilled Nursing Facility Barriers to Discharge: Continued Medical Work up  Expected Discharge Plan and Services Expected Discharge Plan: Skilled Nursing Facility     Post Acute Care Choice: Skilled Nursing Facility Living arrangements for the past 2 months: Single Family Home, Skilled Nursing Facility Expected Discharge Date: 05/09/22                                     Social Determinants of Health (SDOH) Interventions    Readmission Risk Interventions     No data to display

## 2022-05-09 NOTE — Discharge Summary (Addendum)
Physician Discharge Summary   Patient: Pamela Reed MRN: [id number] DOB: [birth year]  Admit date:     05/04/2022  Discharge date: 05/09/22  Discharge Physician: Onalee Hua Anallely Rosell   PCP: Alan Mulder, MD   Recommendations at discharge:   Please follow up with primary care provider within 1-2 weeks  Please repeat BMP and CBC in one week     Hospital Course:  88 y.o. year old female with history of sigmoid diverticulitis in May 2023, COPD, GERD,  HTN, hypothyroidism presenting with n/v/d and generalized weakness. She was then hospitalized at [facility] and transferred to [facility] for diarrhea, melena and anemia.   EGD completed at [facility] on 04/21/22 with mildly torturous esophagus, 7 cm hiatal hernia, otherwise normal stomach and duodenum. She has been a resident at the [facility] since that time.    Patient presented via EMS yesterday evening for complaints of nausea, frequent stools, fatigue, weakness. She denies any black stool; however, she was heme positive in the ED. Admitting Hgb 8.7 yesterday and today 8.5. Discharge Hgb from [facility] a week ago was 8.9.  Noted frequent stools at that time. Stool was like black oil in May 2023. She does not believe she has had any recent black stool. Main complaints of fatigue, weakness, nausea, difficulty controlling urine and bowel movements. Stool is formed and mixed with water. Manson Passey 6/13. Formed stool this morning, bristol stool scale #4. Feels nauseated with eating/drinking. No appetite. Appetite has been poor for several months. Daughter is at bedside and states patient has significantly declined since hospitalization for diverticulitis. Prior to this, she lived independently.    Prior endoscopic evaluation: EGD Apr 21, 2022: mildly torturous esophagus, 7 cm hiatal hernia, otherwise normal stomach and duodenum.  EGD June 2022: non-obstructing Schatzki ring s/p dilation, 6 cm hiatal hernia, normal stomach, normal duodenum Colonoscopy  2012-diverticulosis sigmoid. Internal hemorrhoids. Per Epic documentation.  [facility] Dr[facility] Manfred Shirts.     Assessment and Plan: * Heme positive stool -Holding aspirin>>restart after d/c -GI consult appreciated -CT abdomen pelvis shows no acute findings.  Sigmoid diverticulosis.  Moderate size hiatal hernia -6/14 capsule--sm amount blood in stomach due to EGD trauma, no AVMs or obvious source of blood loss -discussed with GI>>defer colonoscopy for now given recent diverticulitis and clinical stability -Continue PPI -Trend CBC>>Hgb stable and improving 6/15--nulecit 250 mg x 1;  nulecit 125 mg x 1 on 6/17 -6/15 KUB>>no significant stool burden  Hyponatremia - Continue normal saline 75 mill per hour>>saline lock -This has been a gradual trend down from the end of May due to volume depletion -now improved with IVF and improved po intake - Na 136 on day of d/c   Hypokalemia - Replaced -Recheck in the a.m. along with magnesium--2.1  Hypothyroidism - Continue Synthroid  Anxiety - Continue Xanax and Prozac -PDMP reviewed--xanax 0.25 mg, #18 on 04/15/22 and #60 on 10/27/21  Physical deconditioning - Secondary to multiple hospitalizations -Came to [place] from rehab-[facility] -PT eval and treat>>SNF  Iron deficiency anemia Iron saturation 12%, ferritin 429 nulecit x 1 on 6/15 nulecit 125 mg x 1 on 6/17 Hgb 8.8 on day of discharge --Hgb improving last 3 days prior to d/c   Thrombocytosis - Acute phase reactant and iron deficiency -Low suspicion for infection at this time -CT abdomen pelvis does not show signs of infection -No leukocytosis -improving with Fe replacement         Consultants: GI Procedures performed: capsule study  Disposition: Skilled nursing facility Diet  recommendation:  Cardiac diet DISCHARGE MEDICATION: Allergies as of 05/09/2022       Reactions   Contrast Media [iodinated Contrast Media] Shortness Of Breath   Iodine  Shortness Of Breath   Sulfasalazine Itching, Swelling   Dye Fdc Red [red Dye]    Lactose Intolerance (gi)    GI upset   Penicillins    Childhood event-unknown reaction   Shellfish Allergy Nausea Only, Swelling   Throat swelling   Sulfa Antibiotics Itching, Swelling   Venlafaxine Swelling   Throat   Codeine Palpitations   Ropinirole Nausea Only        Medication List     TAKE these medications    acetaminophen 500 MG tablet Commonly known as: TYLENOL Take 500 mg by mouth every 4 (four) hours as needed for mild pain, moderate pain, headache or fever.   albuterol 108 (90 Base) MCG/ACT inhaler Commonly known as: VENTOLIN HFA Inhale 2 puffs into the lungs every 6 (six) hours as needed for wheezing or shortness of breath.   ALPRAZolam 0.25 MG tablet Commonly known as: XANAX Take 1 tablet (0.25 mg total) by mouth 2 (two) times daily as needed.   amLODipine 10 MG tablet Commonly known as: NORVASC Take 10 mg by mouth daily.   aspirin 81 MG tablet Take 81 mg by mouth daily.   bimatoprost 0.03 % ophthalmic solution Commonly known as: LATISSE Place 1 drop into both eyes daily as needed (glaucoma). Place one drop on applicator and apply evenly along the skin of the upper eyelid at base of eyelashes once daily at bedtime; repeat procedure for second eye (use a clean applicator).   Calcium Carb-Cholecalciferol 600-10 MG-MCG Tabs Take 1 tablet by mouth daily.   calcium carbonate 500 MG chewable tablet Commonly known as: TUMS - dosed in mg elemental calcium Chew 1 tablet by mouth daily.   conjugated estrogens vaginal cream Commonly known as: PREMARIN Place 1 applicator vaginally every other day.   ferrous sulfate 325 (65 FE) MG tablet Take 1 tablet (325 mg total) by mouth daily with breakfast. Start taking on: May 10, 2022 What changed: when to take this   FLUoxetine 10 MG tablet Commonly known as: PROZAC Take 10 mg by mouth daily.   fluticasone 50 MCG/ACT nasal  spray Commonly known as: FLONASE Place 2 sprays into both nostrils daily.   furosemide 20 MG tablet Commonly known as: LASIX Take 1 tablet (20 mg total) by mouth daily. Please hold until diarrhea completely resolved What changed: additional instructions   levothyroxine 25 MCG tablet Commonly known as: SYNTHROID Take 50 mcg by mouth daily before breakfast.   lisinopril 20 MG tablet Commonly known as: ZESTRIL Take 20 mg by mouth 2 (two) times daily.   metoprolol succinate 25 MG 24 hr tablet Commonly known as: TOPROL-XL Take 25 mg by mouth 2 (two) times daily.   multivitamin with minerals Tabs tablet Take 1 tablet by mouth daily.   omeprazole 20 MG tablet Commonly known as: PRILOSEC OTC Take 20 mg by mouth daily as needed (heartburn).   ondansetron 4 MG disintegrating tablet Commonly known as: ZOFRAN-ODT Take 4 mg by mouth every 8 (eight) hours as needed for vomiting or nausea.   pantoprazole 40 MG tablet Commonly known as: PROTONIX Take 1 tablet (40 mg total) by mouth 2 (two) times daily.   Rhopressa 0.02 % Soln Generic drug: Netarsudil Dimesylate Place 1 drop into both eyes at bedtime.   simvastatin 5 MG tablet Commonly known as: ZOCOR Take  5 mg by mouth at bedtime.   Symbicort 80-4.5 MCG/ACT inhaler Generic drug: budesonide-formoterol Inhale 2 puffs into the lungs daily at 6 (six) AM.   Vitamin B-Complex Tabs Take 1 tablet by mouth daily.   Vyzulta 0.024 % Soln Generic drug: Latanoprostene Bunod Place 1 drop into both eyes at bedtime.        Contact information for after-discharge care     Destination     [facility] Preferred SNF .   Service: Skilled Nursing Contact information: [address] [place] [address] [phone]                    Discharge Exam: Ceasar Mons Weights   05/04/22 1843 05/05/22 0141 05/05/22 1025  Weight: 77.1 kg 74.8 kg 74.8 kg   HEENT:  Eleanor/AT, No thrush, no icterus CV:  RRR,  no rub, no S3, no S4 Lung:  fine bibasilar crackles. No wheeze Abd:  soft/+BS, NT Ext:  No edema, no lymphangitis, no synovitis, no rash   Condition at discharge: stable  The results of significant diagnostics from this hospitalization (including imaging, microbiology, ancillary and laboratory) are listed below for reference.   Imaging Studies: DG Abd 1 View  Result Date: 05/07/2022 CLINICAL DATA:  Constipation, abdominal pain EXAM: ABDOMEN - 1 VIEW COMPARISON:  None Available. FINDINGS: Included bowel gas pattern is unremarkable. No significant stool burden. IMPRESSION: Unremarkable bowel gas pattern. Electronically Signed   By: Guadlupe Spanish M.D.   On: 05/07/2022 13:35   CT Abdomen Pelvis W Contrast  Result Date: 05/05/2022 CLINICAL DATA:  Abdominal pain, acute, nonlocalized EXAM: CT ABDOMEN AND PELVIS WITH CONTRAST TECHNIQUE: Multidetector CT imaging of the abdomen and pelvis was performed using the standard protocol following bolus administration of intravenous contrast. RADIATION DOSE REDUCTION: This exam was performed according to the departmental dose-optimization program which includes automated exposure control, adjustment of the mA and/or kV according to patient size and/or use of iterative reconstruction technique. CONTRAST:  15mL OMNIPAQUE IOHEXOL 300 MG/ML  SOLN COMPARISON:  04/06/2022 FINDINGS: Lower chest: Moderate-sized hiatal hernia.  No acute abnormality. Hepatobiliary: No focal liver abnormality is seen. Status post cholecystectomy. No biliary dilatation. Pancreas: No focal abnormality or ductal dilatation. Spleen: No focal abnormality.  Normal size. Adrenals/Urinary Tract: No adrenal abnormality. No focal renal abnormality. No stones or hydronephrosis. Urinary bladder is unremarkable. Stomach/Bowel: Few scattered sigmoid diverticula. No active diverticulitis. Stomach and small bowel decompressed, unremarkable. Vascular/Lymphatic: Scattered aortic calcifications. No evidence of  aneurysm or adenopathy. Reproductive: Prior hysterectomy.  No adnexal masses. Other: No free fluid or free air. Musculoskeletal: No acute bony abnormality. IMPRESSION: No acute findings in the abdomen or pelvis. Sigmoid diverticulosis. Moderate-sized hiatal hernia. Electronically Signed   By: Charlett Nose M.D.   On: 05/05/2022 01:56    Microbiology: Results for orders placed or performed during the hospital encounter of 05/04/22  Urine Culture     Status: Abnormal   Collection Time: 05/05/22  1:26 AM   Specimen: Urine, Clean Catch  Result Value Ref Range Status   Specimen Description   Final    URINE, CLEAN CATCH Performed at [facility], [address] [place], [place] [address]    Special Requests   Final    NONE Performed at [facility], [address], [place], [place] [address]    Culture MULTIPLE SPECIES PRESENT, SUGGEST RECOLLECTION (A)  Final   Report Status 05/06/2022 FINAL  Final    Labs: CBC: Recent Labs  Lab 05/04/22 1904  05/05/22 0423 05/05/22 1719 05/06/22 0509 05/07/22 0442 05/08/22 0412 05/09/22 0625  WBC 10.9*   < > 12.2* 12.3* 8.5 8.7 9.4  NEUTROABS 7.4  --   --   --   --   --   --   HGB 8.7*   < > 7.8* 7.3* 7.9* 8.3* 8.8*  HCT 26.7*   < > 24.5* 23.5* 25.6* 27.3* 28.0*  MCV 85.9   < > 86.9 90.0 90.1 90.4 87.8  PLT 532*   < > 524* 498* 494* 492* 471*   < > = values in this interval not displayed.   Basic Metabolic Panel: Recent Labs  Lab 05/04/22 1904 05/05/22 0423 05/06/22 0429 05/07/22 0442 05/08/22 0412  NA 127* 131* 135 135 136  K 3.2* 4.1 3.7 3.5 3.5  CL 94* 98 105 106 108  CO2 24 23 24 23 23   GLUCOSE 121* 177* 117* 86 101*  BUN 11 10 9  5* 5*  CREATININE 0.63 0.58 0.61 0.54 0.56  CALCIUM 8.5* 8.7* 8.5* 8.4* 8.4*  MG  --  2.0 1.8 1.7 2.1   Liver Function Tests: Recent Labs  Lab 05/04/22 1904 05/05/22 0423  AST 39 37  ALT 33 34  ALKPHOS 132* 128*  BILITOT 0.8 0.7  PROT 6.5 6.6  ALBUMIN 2.3* 2.4*   CBG: No results for input(s):  "GLUCAP" in the last 168 hours.  Discharge time spent: greater than 30 minutes.  Signed: 07/04/22, MD [facility] 05/09/2022

## 2022-05-09 NOTE — Progress Notes (Signed)
Patient repositioned. Purewick in place. Patient has zinc ointment to groin areas of redness, improved from yesterday. Family at bedside.

## 2022-05-10 DIAGNOSIS — E871 Hypo-osmolality and hyponatremia: Secondary | ICD-10-CM | POA: Diagnosis not present

## 2022-05-10 DIAGNOSIS — D508 Other iron deficiency anemias: Secondary | ICD-10-CM | POA: Diagnosis not present

## 2022-05-10 DIAGNOSIS — R195 Other fecal abnormalities: Secondary | ICD-10-CM | POA: Diagnosis not present

## 2022-05-10 DIAGNOSIS — E876 Hypokalemia: Secondary | ICD-10-CM | POA: Diagnosis not present

## 2022-05-10 NOTE — Progress Notes (Signed)
Patient has had no pain today. She states that she still feels nauseated. She is not eating well.  She is refusing breeze supplements.  Family is very active in her care. They are anticipating discharge to rehab facility tomorrow.

## 2022-05-10 NOTE — Progress Notes (Signed)
Patient took medications without any complications and went to sleep. She only woke when this nurse entered to give medications.

## 2022-05-10 NOTE — TOC Progression Note (Signed)
Transition of Care [facility]) - Progression Note    Patient Details  Name: Pamela Reed MRN: [id number] Date of Birth: 1931-02-17  Transition of Care [facility]) CM/SW Contact  Larrie Kass, LCSW Phone Number: 05/10/2022, 11:06 AM  Clinical Narrative:    CSW spoke with Debbie from [facility] ,she reported she is unable to see Auth as the hospital submitted auth. CSW does not have access to [facility] system . TOC will follow up on Monday.    Expected Discharge Plan: Skilled Nursing Facility Barriers to Discharge: Continued Medical Work up  Expected Discharge Plan and Services Expected Discharge Plan: Skilled Nursing Facility     Post Acute Care Choice: Skilled Nursing Facility Living arrangements for the past 2 months: Single Family Home, Skilled Nursing Facility Expected Discharge Date: 05/09/22                                     Social Determinants of Health (SDOH) Interventions    Readmission Risk Interventions     No data to display

## 2022-05-10 NOTE — Progress Notes (Signed)
PROGRESS NOTE  Pamela Reed:[id number] DOB: [birth year] DOA: 05/04/2022 PCP: Alan Mulder, MD  Brief History:   88 y.o. year old female with history of sigmoid diverticulitis in May 2023, COPD, GERD,  HTN, hypothyroidism presenting with n/v/d and generalized weakness. She was then hospitalized at [facility] and transferred to [facility] for diarrhea, melena and anemia.   EGD completed at [facility] on 04/21/22 with mildly torturous esophagus, 7 cm hiatal hernia, otherwise normal stomach and duodenum. She has been a resident at the [facility] since that time.    Patient presented via EMS yesterday evening for complaints of nausea, frequent stools, fatigue, weakness. She denies any black stool; however, she was heme positive in the ED. Admitting Hgb 8.7 yesterday and today 8.5. Discharge Hgb from [facility] a week ago was 8.9.  Noted frequent stools at that time. Stool was like black oil in May 2023. She does not believe she has had any recent black stool. Main complaints of fatigue, weakness, nausea, difficulty controlling urine and bowel movements. Stool is formed and mixed with water. Manson Passey 6/13. Formed stool this morning, bristol stool scale #4. Feels nauseated with eating/drinking. No appetite. Appetite has been poor for several months. Daughter is at bedside and states patient has significantly declined since hospitalization for diverticulitis. Prior to this, she lived independently.    Prior endoscopic evaluation: EGD Apr 21, 2022: mildly torturous esophagus, 7 cm hiatal hernia, otherwise normal stomach and duodenum.  EGD June 2022: non-obstructing Schatzki ring s/p dilation, 6 cm hiatal hernia, normal stomach, normal duodenum Colonoscopy 2012-diverticulosis sigmoid. Internal hemorrhoids. Per Epic documentation. [facility], Dr. Manfred Shirts.        Assessment and Plan: * Heme positive stool -Holding aspirin>>restart after d/c -GI consult appreciated -CT abdomen pelvis shows no  acute findings.  Sigmoid diverticulosis.  Moderate size hiatal hernia -6/14 capsule--sm amount blood in stomach due to EGD trauma, no AVMs or obvious source of blood loss -discussed with GI>>defer colonoscopy for now given recent diverticulitis and clinical stability -Continue PPI -Trend CBC>>Hgb stable and improving 6/15--nulecit 250 mg x 1;  nulecit 125 mg x 1 on 6/17 -6/15 KUB>>no significant stool burden  Hyponatremia - Continue normal saline 75 mill per hour>>saline lock -This has been a gradual trend down from the end of May due to volume depletion -now improved with IVF and improved po intake - Na 136 on day of d/c   Hypokalemia - Replaced  Hypothyroidism - Continue Synthroid  Anxiety - Continue Xanax and Prozac -PDMP reviewed--xanax 0.25 mg, #18 on 04/15/22 and #60 on 10/27/21  Physical deconditioning - Secondary to multiple hospitalizations -Came to [place] from rehab-[facility] -PT eval and treat>>SNF  Iron deficiency anemia Iron saturation 12%, ferritin 429 nulecit x 1 on 6/15 nulecit 125 mg x 1 on 6/17 Hgb 8.8 on day of discharge --Hgb improving last 3 days prior to d/c   Thrombocytosis - Acute phase reactant and iron deficiency -Low suspicion for infection at this time -CT abdomen pelvis does not show signs of infection -No leukocytosis -improving with Fe replacement     Family Communication:   Family updated 6/18  Consultants:  GI  Code Status:  DNR  DVT Prophylaxis:  Remy Ds   Procedures: As Listed in Progress Note Above  Antibiotics: None      Subjective: She complains of nausea without cp, sob, vomiting.  She has intermittent abd discomfort.  No diarrhea  Objective: Vitals:   05/09/22 1957 05/09/22 2029 05/10/22 0743 05/10/22  0830  BP:  (!) 141/68  (!) 143/65  Pulse: (!) 104 95  95  Resp: 16   18  Temp:      TempSrc:      SpO2: 94%  94%   Weight:      Height:        Intake/Output Summary (Last 24 hours) at  05/10/2022 1623 Last data filed at 05/10/2022 1300 Gross per 24 hour  Intake 480 ml  Output 400 ml  Net 80 ml   Weight change:  Exam:  General:  Pt is alert, follows commands appropriately, not in acute distress HEENT: No icterus, No thrush, No neck mass, [place]/AT Cardiovascular: RRR, S1/S2, no rubs, no gallops Respiratory: CTA bilaterally, no wheezing, no crackles, no rhonchi Abdomen: Soft/+BS, non tender, non distended, no guarding Extremities: trace LEedema, No lymphangitis, No petechiae, No rashes, no synovitis   Data Reviewed: I have personally reviewed following labs and imaging studies Basic Metabolic Panel: Recent Labs  Lab 05/04/22 1904 05/05/22 0423 05/06/22 0429 05/07/22 0442 05/08/22 0412  NA 127* 131* 135 135 136  K 3.2* 4.1 3.7 3.5 3.5  CL 94* 98 105 106 108  CO2 24 23 24 23 23   GLUCOSE 121* 177* 117* 86 101*  BUN 11 10 9  5* 5*  CREATININE 0.63 0.58 0.61 0.54 0.56  CALCIUM 8.5* 8.7* 8.5* 8.4* 8.4*  MG  --  2.0 1.8 1.7 2.1   Liver Function Tests: Recent Labs  Lab 05/04/22 1904 05/05/22 0423  AST 39 37  ALT 33 34  ALKPHOS 132* 128*  BILITOT 0.8 0.7  PROT 6.5 6.6  ALBUMIN 2.3* 2.4*   Recent Labs  Lab 05/04/22 1904  LIPASE 32   No results for input(s): "AMMONIA" in the last 168 hours. Coagulation Profile: No results for input(s): "INR", "PROTIME" in the last 168 hours. CBC: Recent Labs  Lab 05/04/22 1904 05/05/22 0423 05/05/22 1719 05/06/22 0509 05/07/22 0442 05/08/22 0412 05/09/22 0625  WBC 10.9*   < > 12.2* 12.3* 8.5 8.7 9.4  NEUTROABS 7.4  --   --   --   --   --   --   HGB 8.7*   < > 7.8* 7.3* 7.9* 8.3* 8.8*  HCT 26.7*   < > 24.5* 23.5* 25.6* 27.3* 28.0*  MCV 85.9   < > 86.9 90.0 90.1 90.4 87.8  PLT 532*   < > 524* 498* 494* 492* 471*   < > = values in this interval not displayed.   Cardiac Enzymes: No results for input(s): "CKTOTAL", "CKMB", "CKMBINDEX", "TROPONINI" in the last 168 hours. BNP: Invalid input(s): "POCBNP" CBG: No  results for input(s): "GLUCAP" in the last 168 hours. HbA1C: No results for input(s): "HGBA1C" in the last 72 hours. Urine analysis:    Component Value Date/Time   COLORURINE YELLOW 05/05/2022 0126   APPEARANCEUR CLEAR 05/05/2022 0126   APPEARANCEUR Hazy (A) 10/04/2019 1315   LABSPEC 1.008 05/05/2022 0126   PHURINE 7.0 05/05/2022 0126   GLUCOSEU NEGATIVE 05/05/2022 0126   HGBUR MODERATE (A) 05/05/2022 0126   BILIRUBINUR NEGATIVE 05/05/2022 0126   BILIRUBINUR Negative 10/04/2019 1315   KETONESUR NEGATIVE 05/05/2022 0126   PROTEINUR NEGATIVE 05/05/2022 0126   NITRITE NEGATIVE 05/05/2022 0126   LEUKOCYTESUR SMALL (A) 05/05/2022 0126   Sepsis Labs: @LABRCNTIP (procalcitonin:4,lacticidven:4) ) Recent Results (from the past 240 hour(s))  Urine Culture     Status: Abnormal   Collection Time: 05/05/22  1:26 AM   Specimen: Urine, Clean Catch  Result Value  Ref Range Status   Specimen Description   Final    URINE, CLEAN CATCH Performed at [facility], [address] [place], [place] [address]    Special Requests   Final    NONE Performed at [facility], [address], [place], [place] [address]    Culture MULTIPLE SPECIES PRESENT, SUGGEST RECOLLECTION (A)  Final   Report Status 05/06/2022 FINAL  Final     Scheduled Meds:  amLODipine  10 mg Oral Daily   feeding supplement  1 Container Oral TID BM   ferrous sulfate  325 mg Oral Q breakfast   FLUoxetine  10 mg Oral Daily   levothyroxine  50 mcg Oral Q0600   lisinopril  20 mg Oral BID   metoprolol succinate  25 mg Oral BID   mometasone-formoterol  2 puff Inhalation BID   ondansetron (ZOFRAN) IV  4 mg Intravenous Q6H   pantoprazole  40 mg Oral BID   saccharomyces boulardii  250 mg Oral BID   simvastatin  5 mg Oral QHS   Continuous Infusions:  Procedures/Studies: DG Abd 1 View  Result Date: 05/07/2022 CLINICAL DATA:  Constipation, abdominal pain EXAM: ABDOMEN - 1 VIEW COMPARISON:  None Available. FINDINGS: Included bowel  gas pattern is unremarkable. No significant stool burden. IMPRESSION: Unremarkable bowel gas pattern. Electronically Signed   By: Guadlupe Spanish M.D.   On: 05/07/2022 13:35   CT Abdomen Pelvis W Contrast  Result Date: 05/05/2022 CLINICAL DATA:  Abdominal pain, acute, nonlocalized EXAM: CT ABDOMEN AND PELVIS WITH CONTRAST TECHNIQUE: Multidetector CT imaging of the abdomen and pelvis was performed using the standard protocol following bolus administration of intravenous contrast. RADIATION DOSE REDUCTION: This exam was performed according to the departmental dose-optimization program which includes automated exposure control, adjustment of the mA and/or kV according to patient size and/or use of iterative reconstruction technique. CONTRAST:  49mL OMNIPAQUE IOHEXOL 300 MG/ML  SOLN COMPARISON:  04/06/2022 FINDINGS: Lower chest: Moderate-sized hiatal hernia.  No acute abnormality. Hepatobiliary: No focal liver abnormality is seen. Status post cholecystectomy. No biliary dilatation. Pancreas: No focal abnormality or ductal dilatation. Spleen: No focal abnormality.  Normal size. Adrenals/Urinary Tract: No adrenal abnormality. No focal renal abnormality. No stones or hydronephrosis. Urinary bladder is unremarkable. Stomach/Bowel: Few scattered sigmoid diverticula. No active diverticulitis. Stomach and small bowel decompressed, unremarkable. Vascular/Lymphatic: Scattered aortic calcifications. No evidence of aneurysm or adenopathy. Reproductive: Prior hysterectomy.  No adnexal masses. Other: No free fluid or free air. Musculoskeletal: No acute bony abnormality. IMPRESSION: No acute findings in the abdomen or pelvis. Sigmoid diverticulosis. Moderate-sized hiatal hernia. Electronically Signed   By: Charlett Nose M.D.   On: 05/05/2022 01:56    Catarina Hartshorn, DO  [facility]  If 7PM-7AM, please contact night-coverage [email or web address] Password [email or web address] 05/10/2022, 4:23 PM   LOS: 6 days

## 2022-05-11 DIAGNOSIS — D649 Anemia, unspecified: Secondary | ICD-10-CM

## 2022-05-11 DIAGNOSIS — R195 Other fecal abnormalities: Secondary | ICD-10-CM | POA: Diagnosis not present

## 2022-05-11 DIAGNOSIS — E876 Hypokalemia: Secondary | ICD-10-CM | POA: Diagnosis not present

## 2022-05-11 DIAGNOSIS — E871 Hypo-osmolality and hyponatremia: Secondary | ICD-10-CM | POA: Diagnosis not present

## 2022-05-11 LAB — CBC
HCT: 26.3 % — ABNORMAL LOW (ref 36.0–46.0)
Hemoglobin: 8.4 g/dL — ABNORMAL LOW (ref 12.0–15.0)
MCH: 27.7 pg (ref 26.0–34.0)
MCHC: 31.9 g/dL (ref 30.0–36.0)
MCV: 86.8 fL (ref 80.0–100.0)
Platelets: 407 10*3/uL — ABNORMAL HIGH (ref 150–400)
RBC: 3.03 MIL/uL — ABNORMAL LOW (ref 3.87–5.11)
RDW: 16.3 % — ABNORMAL HIGH (ref 11.5–15.5)
WBC: 12.3 10*3/uL — ABNORMAL HIGH (ref 4.0–10.5)
nRBC: 0.2 % (ref 0.0–0.2)

## 2022-05-11 NOTE — Progress Notes (Signed)
   05/11/22 2054  Assess: MEWS Score  BP (!) 123/51  MAP (mmHg) 72  Pulse Rate 81  Resp 18  SpO2 94 %  O2 Device Room Air  Assess: MEWS Score  MEWS Temp 0  MEWS Systolic 0  MEWS Pulse 0  MEWS RR 0  MEWS LOC 0  MEWS Score 0  MEWS Score Color Green  Notify: Provider  Provider Name/Title Adefeso  Date Provider Notified 05/11/22  Time Provider Notified 2110  Method of Notification  (amion)  Notification Reason Other (Comment) (dbp below 60 due to get metoprolol and lisinopril)  Provider response Other (Comment) (said to hold should only be daily meds)  Date of Provider Response 05/11/22  Time of Provider Response 2135  Assess: SIRS CRITERIA  SIRS Temperature  0  SIRS Pulse 0  SIRS Respirations  0  SIRS WBC 0  SIRS Score Sum  0

## 2022-05-11 NOTE — Progress Notes (Signed)
Physical Therapy Treatment Patient Details Name: Pamela Reed MRN: [id number] DOB: 1931/09/02 Today's Date: 05/11/2022   History of Present Illness Pamela Reed is a 88 y.o. female with medical history significant of with history of COPD, GERD, hypertension, hard of hearing with hearing aid, hypothyroidism, mixed urinary incontinence, and recent hospitalizations for diarrhea/GI bleed the, and more presents the ED with a chief complaint of generalized weakness.  Patient reports that this has been a long course.  She reports that in May she had been admitted to [facility] for nausea vomiting and diarrhea.  They gave her antibiotics and medication for nausea and sent her home with home health.  Apparently she did not have help 1 night when she needed to get up to go to the bathroom and she fell and could not stand back up.  She went to [facility] in [place] at that time.  She was having diarrhea and melena at that time.  She describes her bowel movements is looking like black oil.  She thinks she was again given some antibiotics.  An upper GI was done at the end of May.  She was again discharged to rehab.  Patient reports that she has only gotten worse.  She has become so weak that she can barely handle any of her ADLs.  She reports she is having diarrhea 4-5 times per day.  She is not sure anymore if that is melena or hematochezia or just liquid, as she has not been looking at it.  She reports she has nausea and vomiting every time she eats.  She also has left lower quadrant pain that feels like a cramping.  Patient also complains of early satiety, and subjective fevers.  She says that she has shortness of breath but she describes it as being so fatigued that she feels like she has to take a break, especially when chewing-which seems to be her main exertion for the day.  Patient reports that she has dysuria but it has improved from previous hospitalization.  She has no other complaints at this time.     PT Comments    Patient presents with splint on left wrist and agreeable for therapy.  Patient unable to prop up onto elbows during supine to sitting secondary to weakness and limited use of LUE, able to lean on left platform walker, but limited to a couple of side steps before having to sit due to generalized weakness and poor standing balance.  Patient unable to transfer to chair using left platform walker due to poor standing balance and required stand pivot with left knee blocked to transfer to chair.  Patient tolerated sitting up in chair after therapy - nursing staff notified.  Patient will benefit from continued skilled physical therapy in hospital and recommended venue below to increase strength, balance, endurance for safe ADLs and gait.    Recommendations for follow up therapy are one component of a multi-disciplinary discharge planning process, led by the attending physician.  Recommendations may be updated based on patient status, additional functional criteria and insurance authorization.  Follow Up Recommendations  Skilled nursing-short term rehab (<3 hours/day)     Assistance Recommended at Discharge Intermittent Supervision/Assistance  Patient can return home with the following A lot of help with walking and/or transfers;A lot of help with bathing/dressing/bathroom;Assist for transportation;Assistance with cooking/housework;Help with stairs or ramp for entrance   Equipment Recommendations  None recommended by PT    Recommendations for Other Services  Precautions / Restrictions Precautions Precautions: Fall Restrictions Weight Bearing Restrictions: No     Mobility  Bed Mobility Overal bed mobility: Needs Assistance Bed Mobility: Supine to Sit     Supine to sit: Mod assist, HOB elevated, Max assist     General bed mobility comments: slow labored movement with poor carryover for propping up on elbows due to weakness    Transfers Overall transfer level: Needs  assistance Equipment used: Left platform walker Transfers: Sit to/from Stand, Bed to chair/wheelchair/BSC Sit to Stand: Mod assist   Step pivot transfers: Mod assist            Ambulation/Gait Ambulation/Gait assistance: Mod assist, Max assist Gait Distance (Feet): 2 Feet Assistive device: Left platform walker Gait Pattern/deviations: Decreased step length - right, Decreased step length - left, Decreased stride length Gait velocity: slow     General Gait Details: limited to a couple fo slow labored side steps due to BLE weakness with buckling of knees   Stairs             Wheelchair Mobility    Modified Rankin (Stroke Patients Only)       Balance Overall balance assessment: Needs assistance Sitting-balance support: Feet supported, No upper extremity supported Sitting balance-Leahy Scale: Fair Sitting balance - Comments: seated at EOB   Standing balance support: Reliant on assistive device for balance, During functional activity, Bilateral upper extremity supported Standing balance-Leahy Scale: Poor Standing balance comment: using Left platform walker                            Cognition Arousal/Alertness: Awake/alert Behavior During Therapy: WFL for tasks assessed/performed Overall Cognitive Status: Within Functional Limits for tasks assessed                                          Exercises General Exercises - Lower Extremity Long Arc Quad: Seated, AROM, Strengthening, Both, 10 reps Hip Flexion/Marching: Seated, AROM, Strengthening, Both, 10 reps Toe Raises: Seated, AROM, Strengthening, Both, 10 reps Heel Raises: Seated, AROM, Strengthening, Both, 10 reps    General Comments        Pertinent Vitals/Pain Pain Assessment Pain Assessment: No/denies pain    Home Living                          Prior Function            PT Goals (current goals can now be found in the care plan section) Acute Rehab PT  Goals Patient Stated Goal: get better PT Goal Formulation: With patient/family Time For Goal Achievement: 05/19/22 Potential to Achieve Goals: Good Progress towards PT goals: Progressing toward goals    Frequency    Min 3X/week      PT Plan Current plan remains appropriate    Co-evaluation              AM-PAC PT "6 Clicks" Mobility   Outcome Measure  Help needed turning from your back to your side while in a flat bed without using bedrails?: A Lot Help needed moving from lying on your back to sitting on the side of a flat bed without using bedrails?: A Lot Help needed moving to and from a bed to a chair (including a wheelchair)?: A Lot Help needed standing up from a chair using your  arms (e.g., wheelchair or bedside chair)?: A Lot Help needed to walk in hospital room?: A Lot Help needed climbing 3-5 steps with a railing? : Total 6 Click Score: 11    End of Session   Activity Tolerance: Patient tolerated treatment well;Patient limited by fatigue Patient left: in chair;with call bell/phone within reach;with family/visitor present Nurse Communication: Mobility status PT Visit Diagnosis: Unsteadiness on feet (R26.81);Difficulty in walking, not elsewhere classified (R26.2);Muscle weakness (generalized) (M62.81);History of falling (Z91.81)     Time: [id number] PT Time Calculation (min) (ACUTE ONLY): 27 min  Charges:  $Therapeutic Exercise: 8-22 mins $Therapeutic Activity: 8-22 mins                     3:40 PM, 05/11/22 Lonell Grandchild, MPT Physical Therapist with [facility] 336 [phone] office [phone] mobile phone

## 2022-05-11 NOTE — Care Management Important Message (Signed)
Important Message  Patient Details  Name: Pamela Reed MRN: [id number] Date of Birth: Sep 09, 1931   Medicare Important Message Given:  Yes (copy given to family member in room)     Corey Harold 05/11/2022, 11:44 AM

## 2022-05-11 NOTE — TOC Transition Note (Addendum)
Transition of Care [facility]) - CM/SW Discharge Note   Patient Details  Name: Pamela Reed MRN: [id number] Date of Birth: 12-11-1930  [facility]) CM/SW Contact:  Karn Cassis, LCSW Phone Number: 05/11/2022, 2:45 PM   Clinical Narrative:  Authorization received ([id number]) for [facility]. Pt's daughter, [place] notified and requests transport via [facility]. D/C summary sent to SNF. Will provide RN with contact information to call report to SNF.   Update: Per EMS, several transports scheduled ahead of pt and only one truck available. Discussed wheelchair transport with pt's daughter and she isn't comfortable with that, especially [place] of [place]. She states pt was just moved to chair for first time since she was admitted and pt feels she needs EMS. RN and SNF updated.   Update: Facility not willing to accept pt until tomorrow as transport may be late arriving. Pt's daughter, RN, EMS, and MD updated.   Final next level of care: Skilled Nursing Facility Barriers to Discharge: Barriers Resolved   Patient Goals and CMS Choice Patient states their goals for this hospitalization and ongoing recovery are:: return to rehab then home   Choice offered to / list presented to : Patient, Adult Children  Discharge Placement              Patient chooses bed at: [facility] [place] Patient to be transferred to facility by: [facility] Name of family member notified: [place]- daughter Patient and family notified of of transfer: 05/11/22  Discharge Plan and Services     Post Acute Care Choice: Skilled Nursing Facility                               Social Determinants of Health (SDOH) Interventions     Readmission Risk Interventions     No data to display

## 2022-05-11 NOTE — Progress Notes (Signed)
Patient took medications and slept. She only awoke when this nurse entered the room to give medications. No complaint of pain. Call bell within reach.

## 2022-05-11 NOTE — Discharge Summary (Signed)
Physician Discharge Summary   Patient: Pamela Reed MRN: [id number] DOB: 29-Mar-1931  Admit date:     05/04/2022  Discharge date: 05/11/22  Discharge Physician: Onalee Hua Michelena Culmer   PCP: Alan Mulder, MD   Recommendations at discharge:   Please follow up with primary care provider within 1-2 weeks  Please repeat BMP and CBC in one week     Hospital Course:  88 y.o. year old female with history of sigmoid diverticulitis in May 2023, COPD, GERD,  HTN, hypothyroidism presenting with n/v/d and generalized weakness. She was then hospitalized at [facility] and transferred to [facility] for diarrhea, melena and anemia.   EGD completed at [facility] on 04/21/22 with mildly torturous esophagus, 7 cm hiatal hernia, otherwise normal stomach and duodenum. She has been a resident at the [facility] since that time.    Patient presented via EMS yesterday evening for complaints of nausea, frequent stools, fatigue, weakness. She denies any black stool; however, she was heme positive in the ED. Admitting Hgb 8.7 yesterday and today 8.5. Discharge Hgb from [facility] a week ago was 8.9.  Noted frequent stools at that time. Stool was like black oil in May 2023. She does not believe she has had any recent black stool. Main complaints of fatigue, weakness, nausea, difficulty controlling urine and bowel movements. Stool is formed and mixed with water. Manson Passey 6/13. Formed stool this morning, bristol stool scale #4. Feels nauseated with eating/drinking. No appetite. Appetite has been poor for several months. Daughter is at bedside and states patient has significantly declined since hospitalization for diverticulitis. Prior to this, she lived independently.    Prior endoscopic evaluation: EGD Apr 21, 2022: mildly torturous esophagus, 7 cm hiatal hernia, otherwise normal stomach and duodenum.  EGD June 2022: non-obstructing Schatzki ring s/p dilation, 6 cm hiatal hernia, normal stomach, normal duodenum Colonoscopy  2012-diverticulosis sigmoid. Internal hemorrhoids. Per Epic documentation. [facility], Dr. Manfred Shirts.     Assessment and Plan: * Heme positive stool -Holding aspirin>>restart after d/c -GI consult appreciated -CT abdomen pelvis shows no acute findings.  Sigmoid diverticulosis.  Moderate size hiatal hernia -6/14 capsule--sm amount blood in stomach due to EGD trauma, no AVMs or obvious source of blood loss -discussed with GI>>defer colonoscopy for now given recent diverticulitis and clinical stability -Continue PPI -Trend CBC>>Hgb stable and improving 6/15--nulecit 250 mg x 1;  nulecit 125 mg x 1 on 6/17 -6/15 KUB>>no significant stool burden Hgb 8.4 on day of d/c Diet advanced and patient tolerated well  Hyponatremia - Continue normal saline 75 mill per hour>>saline lock -This has been a gradual trend down from the end of May due to volume depletion -now improved with IVF and improved po intake - Na 136 on day of d/c   Hypokalemia - Replaced  Hypothyroidism - Continue Synthroid  Anxiety - Continue Xanax and Prozac -PDMP reviewed--xanax 0.25 mg, #18 on 04/15/22 and #60 on 10/27/21  Physical deconditioning - Secondary to multiple hospitalizations -Came to [place] from rehab-[facility] -PT eval and treat>>SNF  Iron deficiency anemia Iron saturation 12%, ferritin 429 nulecit x 1 on 6/15 nulecit 125 mg x 1 on 6/17 Hgb 8.8 on day of discharge --Hgb improving/stable --Hgb 8.4 on day of d/c   Thrombocytosis - Acute phase reactant and iron deficiency -Low suspicion for infection at this time -CT abdomen pelvis does not show signs of infection -No leukocytosis -improving with Fe replacement         Consultants: GI Procedures performed: EGD  Disposition:  Skilled nursing facility Diet recommendation:  Soft diet  DISCHARGE MEDICATION: Allergies as of 05/11/2022       Reactions   Contrast Media [iodinated Contrast Media] Shortness Of Breath    Iodine Shortness Of Breath   Sulfasalazine Itching, Swelling   Dye Fdc Red [red Dye]    Lactose Intolerance (gi)    GI upset   Penicillins    Childhood event-unknown reaction   Shellfish Allergy Nausea Only, Swelling   Throat swelling   Sulfa Antibiotics Itching, Swelling   Venlafaxine Swelling   Throat   Codeine Palpitations   Ropinirole Nausea Only        Medication List     STOP taking these medications    omeprazole 20 MG tablet Commonly known as: PRILOSEC OTC       TAKE these medications    acetaminophen 500 MG tablet Commonly known as: TYLENOL Take 500 mg by mouth every 4 (four) hours as needed for mild pain, moderate pain, headache or fever.   albuterol 108 (90 Base) MCG/ACT inhaler Commonly known as: VENTOLIN HFA Inhale 2 puffs into the lungs every 6 (six) hours as needed for wheezing or shortness of breath.   ALPRAZolam 0.25 MG tablet Commonly known as: XANAX Take 1 tablet (0.25 mg total) by mouth 2 (two) times daily as needed.   amLODipine 10 MG tablet Commonly known as: NORVASC Take 10 mg by mouth daily.   aspirin 81 MG tablet Take 81 mg by mouth daily.   bimatoprost 0.03 % ophthalmic solution Commonly known as: LATISSE Place 1 drop into both eyes daily as needed (glaucoma). Place one drop on applicator and apply evenly along the skin of the upper eyelid at base of eyelashes once daily at bedtime; repeat procedure for second eye (use a clean applicator).   Calcium Carb-Cholecalciferol 600-10 MG-MCG Tabs Take 1 tablet by mouth daily.   calcium carbonate 500 MG chewable tablet Commonly known as: TUMS - dosed in mg elemental calcium Chew 1 tablet by mouth daily.   conjugated estrogens vaginal cream Commonly known as: PREMARIN Place 1 applicator vaginally every other day.   ferrous sulfate 325 (65 FE) MG tablet Take 1 tablet (325 mg total) by mouth daily with breakfast. What changed: when to take this   FLUoxetine 10 MG tablet Commonly  known as: PROZAC Take 10 mg by mouth daily.   fluticasone 50 MCG/ACT nasal spray Commonly known as: FLONASE Place 2 sprays into both nostrils daily.   furosemide 20 MG tablet Commonly known as: LASIX Take 1 tablet (20 mg total) by mouth daily. Please hold until diarrhea completely resolved What changed: additional instructions   levothyroxine 25 MCG tablet Commonly known as: SYNTHROID Take 50 mcg by mouth daily before breakfast.   lisinopril 20 MG tablet Commonly known as: ZESTRIL Take 20 mg by mouth 2 (two) times daily.   metoprolol succinate 25 MG 24 hr tablet Commonly known as: TOPROL-XL Take 25 mg by mouth 2 (two) times daily.   multivitamin with minerals Tabs tablet Take 1 tablet by mouth daily.   ondansetron 4 MG disintegrating tablet Commonly known as: ZOFRAN-ODT Take 4 mg by mouth every 8 (eight) hours as needed for vomiting or nausea.   pantoprazole 40 MG tablet Commonly known as: PROTONIX Take 1 tablet (40 mg total) by mouth 2 (two) times daily.   Rhopressa 0.02 % Soln Generic drug: Netarsudil Dimesylate Place 1 drop into both eyes at bedtime.   simvastatin 5 MG tablet Commonly known as: ZOCOR  Take 5 mg by mouth at bedtime.   Symbicort 80-4.5 MCG/ACT inhaler Generic drug: budesonide-formoterol Inhale 2 puffs into the lungs daily at 6 (six) AM.   Vitamin B-Complex Tabs Take 1 tablet by mouth daily.   Vyzulta 0.024 % Soln Generic drug: Latanoprostene Bunod Place 1 drop into both eyes at bedtime.        Contact information for after-discharge care     Destination     [facility] .   Service: Skilled Nursing Contact information: [address] [place] [address] [phone]                    Discharge Exam: Ceasar Mons Weights   05/04/22 1843 05/05/22 0141 05/05/22 1025  Weight: 77.1 kg 74.8 kg 74.8 kg   HEENT:  [place]/AT, No thrush, no icterus CV:  RRR, no rub, no S3, no S4 Lung:  CTA,  no wheeze, no rhonchi Abd:  soft/+BS, NT Ext:  trace LE edema, no lymphangitis, no synovitis, no rash   Condition at discharge: stable  The results of significant diagnostics from this hospitalization (including imaging, microbiology, ancillary and laboratory) are listed below for reference.   Imaging Studies: DG Abd 1 View  Result Date: 05/07/2022 CLINICAL DATA:  Constipation, abdominal pain EXAM: ABDOMEN - 1 VIEW COMPARISON:  None Available. FINDINGS: Included bowel gas pattern is unremarkable. No significant stool burden. IMPRESSION: Unremarkable bowel gas pattern. Electronically Signed   By: Guadlupe Spanish M.D.   On: 05/07/2022 13:35   CT Abdomen Pelvis W Contrast  Result Date: 05/05/2022 CLINICAL DATA:  Abdominal pain, acute, nonlocalized EXAM: CT ABDOMEN AND PELVIS WITH CONTRAST TECHNIQUE: Multidetector CT imaging of the abdomen and pelvis was performed using the standard protocol following bolus administration of intravenous contrast. RADIATION DOSE REDUCTION: This exam was performed according to the departmental dose-optimization program which includes automated exposure control, adjustment of the mA and/or kV according to patient size and/or use of iterative reconstruction technique. CONTRAST:  52mL OMNIPAQUE IOHEXOL 300 MG/ML  SOLN COMPARISON:  04/06/2022 FINDINGS: Lower chest: Moderate-sized hiatal hernia.  No acute abnormality. Hepatobiliary: No focal liver abnormality is seen. Status post cholecystectomy. No biliary dilatation. Pancreas: No focal abnormality or ductal dilatation. Spleen: No focal abnormality.  Normal size. Adrenals/Urinary Tract: No adrenal abnormality. No focal renal abnormality. No stones or hydronephrosis. Urinary bladder is unremarkable. Stomach/Bowel: Few scattered sigmoid diverticula. No active diverticulitis. Stomach and small bowel decompressed, unremarkable. Vascular/Lymphatic: Scattered aortic calcifications. No evidence of aneurysm or adenopathy. Reproductive:  Prior hysterectomy.  No adnexal masses. Other: No free fluid or free air. Musculoskeletal: No acute bony abnormality. IMPRESSION: No acute findings in the abdomen or pelvis. Sigmoid diverticulosis. Moderate-sized hiatal hernia. Electronically Signed   By: Charlett Nose M.D.   On: 05/05/2022 01:56    Microbiology: Results for orders placed or performed during the hospital encounter of 05/04/22  Urine Culture     Status: Abnormal   Collection Time: 05/05/22  1:26 AM   Specimen: Urine, Clean Catch  Result Value Ref Range Status   Specimen Description   Final    URINE, CLEAN CATCH Performed at [facility], [address], [place], [place] [address]    Special Requests   Final    NONE Performed at [facility], [address] [place], [place] [address]    Culture MULTIPLE SPECIES PRESENT, SUGGEST RECOLLECTION (A)  Final   Report Status 05/06/2022 FINAL  Final    Labs: CBC: Recent Labs  Lab  05/04/22 1904 05/05/22 0423 05/06/22 0509 05/07/22 0442 05/08/22 0412 05/09/22 0625 05/11/22 0418  WBC 10.9*   < > 12.3* 8.5 8.7 9.4 12.3*  NEUTROABS 7.4  --   --   --   --   --   --   HGB 8.7*   < > 7.3* 7.9* 8.3* 8.8* 8.4*  HCT 26.7*   < > 23.5* 25.6* 27.3* 28.0* 26.3*  MCV 85.9   < > 90.0 90.1 90.4 87.8 86.8  PLT 532*   < > 498* 494* 492* 471* 407*   < > = values in this interval not displayed.   Basic Metabolic Panel: Recent Labs  Lab 05/04/22 1904 05/05/22 0423 05/06/22 0429 05/07/22 0442 05/08/22 0412  NA 127* 131* 135 135 136  K 3.2* 4.1 3.7 3.5 3.5  CL 94* 98 105 106 108  CO2 24 23 24 23 23   GLUCOSE 121* 177* 117* 86 101*  BUN 11 10 9  5* 5*  CREATININE 0.63 0.58 0.61 0.54 0.56  CALCIUM 8.5* 8.7* 8.5* 8.4* 8.4*  MG  --  2.0 1.8 1.7 2.1   Liver Function Tests: Recent Labs  Lab 05/04/22 1904 05/05/22 0423  AST 39 37  ALT 33 34  ALKPHOS 132* 128*  BILITOT 0.8 0.7  PROT 6.5 6.6  ALBUMIN 2.3* 2.4*   CBG: No results for input(s): "GLUCAP" in the last 168  hours.  Discharge time spent: greater than 30 minutes.  Signed: 07/04/22, MD [facility] 05/11/2022

## 2022-05-11 NOTE — TOC Progression Note (Signed)
Transition of Care [facility]) - Progression Note    Patient Details  Name: AVAMAE DEHAAN MRN: [id number] Date of Birth: Jun 29, 1931  [facility] CM/SW Contact  Karn Cassis, [place] Phone Number: 05/11/2022, 11:52 AM  Clinical Narrative:  LCSW called pt's daughter, Benetta Spar to discuss d/c today to [facility] as Berkley Harvey received. [place] states she notified TOC on Friday that she wanted [facility] and she has spoken with admissions at [facility] and they are willing to consider pt. Discussed with admission at facility and they can accept pt. CMA switching authorization from [facility] to [employer]. Daughter updated.       Expected Discharge Plan: Skilled Nursing Facility Barriers to Discharge: Continued Medical Work up  Expected Discharge Plan and Services Expected Discharge Plan: Skilled Nursing Facility     Post Acute Care Choice: Skilled Nursing Facility Living arrangements for the past 2 months: Single Family Home, Skilled Nursing Facility Expected Discharge Date: 05/09/22                                     Social Determinants of Health (SDOH) Interventions    Readmission Risk Interventions     No data to display

## 2022-05-11 NOTE — TOC Progression Note (Signed)
LATE ENTRY FOR 05/08/22 Transition of Care [facility]) - Progression Note    Patient Details  Name: SENYA HINZMAN MRN: [id number] Date of Birth: 11-Aug-1931  Transition of Care [facility]) CM/SW Contact  Annice Needy, LCSW Phone Number: 05/11/2022, 4:01 PM  Clinical Narrative:    Spoke with daughter, Benetta Spar, inquired about bed offers provided yesterday. [place] requested that referral be made to [facility] in [place]. Referral made.  Referral made and phone call made to [facility]. Spoke with Admissions and advised that they would look at referral.  Contacted [facility] again and spoke with Admissions. Advised that they noticed that patient had used some of her days recently. Discussed that patient had been to both [facility] and [facility] for rehab, however the family wanted a different facility. Admissions advised that they would have to decline patient due to days used and when she was previously with the family was not pleased.  Spoke with [place] and advised of denial and discussed that a decision needed to be made so the insurance company could be notified as to which facility she was going to.  [place] stated that she was being rushed to make a decision. Advised [place] that she had been provided all the bed offers yesterday so she had more than 24 hours to make a decision. [place] stated that she was thinking that [facility] would accept patient and that is why she had not considered the other facilities. Discussed that [facility] was not even provided in the facilities on yesterday as the referral had just been made and declined on today. [place] agreed to accept [facility] offer. Debbie at [facility] notified of bed acceptance and expected admission for 05/09/22.  Expected Discharge Plan: Skilled Nursing Facility Barriers to Discharge: Barriers Resolved  Expected Discharge Plan and Services Expected Discharge Plan: Skilled Nursing Facility     Post Acute Care Choice: Skilled Nursing Facility Living  arrangements for the past 2 months: Single Family Home, Skilled Nursing Facility Expected Discharge Date: 05/09/22                                     Social Determinants of Health (SDOH) Interventions    Readmission Risk Interventions     No data to display

## 2022-05-12 DIAGNOSIS — E876 Hypokalemia: Secondary | ICD-10-CM | POA: Diagnosis not present

## 2022-05-12 DIAGNOSIS — R2681 Unsteadiness on feet: Secondary | ICD-10-CM | POA: Diagnosis not present

## 2022-05-12 DIAGNOSIS — M25532 Pain in left wrist: Secondary | ICD-10-CM | POA: Diagnosis not present

## 2022-05-12 DIAGNOSIS — F32A Depression, unspecified: Secondary | ICD-10-CM | POA: Diagnosis not present

## 2022-05-12 DIAGNOSIS — Z7401 Bed confinement status: Secondary | ICD-10-CM | POA: Diagnosis not present

## 2022-05-12 DIAGNOSIS — E039 Hypothyroidism, unspecified: Secondary | ICD-10-CM | POA: Diagnosis not present

## 2022-05-12 DIAGNOSIS — I10 Essential (primary) hypertension: Secondary | ICD-10-CM | POA: Diagnosis not present

## 2022-05-12 DIAGNOSIS — E162 Hypoglycemia, unspecified: Secondary | ICD-10-CM | POA: Diagnosis not present

## 2022-05-12 DIAGNOSIS — E785 Hyperlipidemia, unspecified: Secondary | ICD-10-CM | POA: Diagnosis not present

## 2022-05-12 DIAGNOSIS — D75839 Thrombocytosis, unspecified: Secondary | ICD-10-CM | POA: Diagnosis not present

## 2022-05-12 DIAGNOSIS — E569 Vitamin deficiency, unspecified: Secondary | ICD-10-CM | POA: Diagnosis not present

## 2022-05-12 DIAGNOSIS — D649 Anemia, unspecified: Secondary | ICD-10-CM | POA: Diagnosis not present

## 2022-05-12 DIAGNOSIS — Z741 Need for assistance with personal care: Secondary | ICD-10-CM | POA: Diagnosis not present

## 2022-05-12 DIAGNOSIS — E871 Hypo-osmolality and hyponatremia: Secondary | ICD-10-CM | POA: Diagnosis not present

## 2022-05-12 DIAGNOSIS — E161 Other hypoglycemia: Secondary | ICD-10-CM | POA: Diagnosis not present

## 2022-05-12 DIAGNOSIS — R41841 Cognitive communication deficit: Secondary | ICD-10-CM | POA: Diagnosis not present

## 2022-05-12 DIAGNOSIS — F419 Anxiety disorder, unspecified: Secondary | ICD-10-CM | POA: Diagnosis not present

## 2022-05-12 DIAGNOSIS — R531 Weakness: Secondary | ICD-10-CM | POA: Diagnosis not present

## 2022-05-12 DIAGNOSIS — J45998 Other asthma: Secondary | ICD-10-CM | POA: Diagnosis not present

## 2022-05-12 DIAGNOSIS — D508 Other iron deficiency anemias: Secondary | ICD-10-CM | POA: Diagnosis not present

## 2022-05-12 DIAGNOSIS — M6281 Muscle weakness (generalized): Secondary | ICD-10-CM | POA: Diagnosis not present

## 2022-05-12 DIAGNOSIS — Z743 Need for continuous supervision: Secondary | ICD-10-CM | POA: Diagnosis not present

## 2022-05-12 DIAGNOSIS — T68XXXA Hypothermia, initial encounter: Secondary | ICD-10-CM | POA: Diagnosis not present

## 2022-05-12 DIAGNOSIS — M19032 Primary osteoarthritis, left wrist: Secondary | ICD-10-CM | POA: Diagnosis not present

## 2022-05-12 DIAGNOSIS — N390 Urinary tract infection, site not specified: Secondary | ICD-10-CM | POA: Diagnosis not present

## 2022-05-12 DIAGNOSIS — K5792 Diverticulitis of intestine, part unspecified, without perforation or abscess without bleeding: Secondary | ICD-10-CM | POA: Diagnosis not present

## 2022-05-12 DIAGNOSIS — R195 Other fecal abnormalities: Secondary | ICD-10-CM | POA: Diagnosis not present

## 2022-05-12 DIAGNOSIS — M6259 Muscle wasting and atrophy, not elsewhere classified, multiple sites: Secondary | ICD-10-CM | POA: Diagnosis not present

## 2022-05-12 DIAGNOSIS — K219 Gastro-esophageal reflux disease without esophagitis: Secondary | ICD-10-CM | POA: Diagnosis not present

## 2022-05-12 DIAGNOSIS — R1312 Dysphagia, oropharyngeal phase: Secondary | ICD-10-CM | POA: Diagnosis not present

## 2022-05-12 MED ORDER — LISINOPRIL 10 MG PO TABS: 20.0000 mg | ORAL_TABLET | Freq: Every day | ORAL | Status: DC

## 2022-05-12 MED ORDER — AMLODIPINE BESYLATE 5 MG PO TABS: 5.0000 mg | ORAL_TABLET | Freq: Every day | ORAL | Status: DC

## 2022-05-12 MED ORDER — METOPROLOL SUCCINATE ER 25 MG PO TB24: 75.0000 mg | ORAL_TABLET | Freq: Every day | ORAL | Status: DC

## 2022-05-12 MED ORDER — LISINOPRIL 20 MG PO TABS: 20.0000 mg | ORAL_TABLET | Freq: Every day | ORAL | Status: DC

## 2022-05-12 MED ORDER — METOPROLOL SUCCINATE ER 50 MG PO TB24: 75.0000 mg | ORAL_TABLET | Freq: Every day | ORAL | Status: DC

## 2022-05-12 NOTE — Discharge Summary (Signed)
Physician Discharge Summary   Patient: Pamela Reed MRN: [id number] DOB: [birth year]  Admit date:     05/04/2022  Discharge date: 05/12/22  Discharge Physician: Onalee Hua Candida Vetter   PCP: Alan Mulder, MD   Recommendations at discharge:   Please follow up with primary care provider within 1-2 weeks  Please repeat BMP and CBC in one week     Hospital Course:  88 y.o. year old female with history of sigmoid diverticulitis in May 2023, COPD, GERD,  HTN, hypothyroidism presenting with n/v/d and generalized weakness. She was then hospitalized at [facility] and transferred to [facility] for diarrhea, melena and anemia.   EGD completed at [facility] on 04/21/22 with mildly torturous esophagus, 7 cm hiatal hernia, otherwise normal stomach and duodenum. [facility] a [facility] since that time.    Patient presented via EMS yesterday evening for complaints of nausea, frequent stools, fatigue, weakness. She denies any black stool; however, she was heme positive in the ED. Admitting Hgb 8.7 yesterday and today 8.5. Discharge Hgb from [facility] a week ago was 8.9.  Noted frequent stools at that time. Stool was like black oil in May 2023. She does not believe she has had any recent black stool. Main complaints of fatigue, weakness, nausea, difficulty controlling urine and bowel movements. Stool is formed and mixed with water. Manson Passey 6/13. Formed stool this morning, bristol stool scale #4. Feels nauseated with eating/drinking. No appetite. Appetite has been poor for several months. Daughter is at bedside and states patient has significantly declined since hospitalization for diverticulitis. Prior to this, she lived independently.    Prior endoscopic evaluation: EGD Apr 21, 2022: mildly torturous esophagus, 7 cm hiatal hernia, otherwise normal stomach and duodenum.  EGD June 2022: non-obstructing Schatzki ring s/p dilation, 6 cm hiatal hernia, normal stomach, normal duodenum Colonoscopy  2012-diverticulosis sigmoid. Internal hemorrhoids. Per Epic documentation. [facility], Dr. Manfred Shirts.     Assessment and Plan: * Heme positive stool -Holding aspirin>>restart after d/c -GI consult appreciated -CT abdomen pelvis shows no acute findings.  Sigmoid diverticulosis.  Moderate size hiatal hernia -6/14 capsule--sm amount blood in stomach due to EGD trauma, no AVMs or obvious source of blood loss -discussed with GI>>defer colonoscopy for now given recent diverticulitis and clinical stability -Continue PPI -Trend CBC>>Hgb stable and improving 6/15--nulecit 250 mg x 1;  nulecit 125 mg x 1 on 6/17 -6/15 KUB>>no significant stool burden Hgb 8.4 on day of d/c Diet advanced and patient tolerated well  Hyponatremia - Continue normal saline 75 mill per hour>>saline lock -This has been a gradual trend down from the end of May due to volume depletion -now improved with IVF and improved po intake - Na 136 on day of d/c   Hypokalemia - Replaced  Hypothyroidism - Continue Synthroid  Anxiety - Continue Xanax and Prozac -PDMP reviewed--xanax 0.25 mg, #18 on 04/15/22 and #60 on 10/27/21  Physical deconditioning - Secondary to multiple hospitalizations -Came to [place] from rehab[facility] -PT eval and treat>>SNF  Iron deficiency anemia Iron saturation 12%, ferritin 429 nulecit x 1 on 6/15 nulecit 125 mg x 1 on 6/17 Hgb 8.8 on day of discharge --Hgb improving/stable --Hgb 8.4 on day of d/c   Thrombocytosis - Acute phase reactant and iron deficiency -Low suspicion for infection at this time -CT abdomen pelvis does not show signs of infection -No leukocytosis -improving with Fe replacement         Consultants: GI  Procedures performed: capsule study  Disposition: SNF Diet recommendation:  Cardiac diet DISCHARGE MEDICATION: Allergies as of 05/12/2022       Reactions   Contrast Media [iodinated Contrast Media] Shortness Of Breath   Iodine  Shortness Of Breath   Sulfasalazine Itching, Swelling   Dye Fdc Red [red Dye]    Lactose Intolerance (gi)    GI upset   Penicillins    Childhood event-unknown reaction   Shellfish Allergy Nausea Only, Swelling   Throat swelling   Sulfa Antibiotics Itching, Swelling   Venlafaxine Swelling   Throat   Codeine Palpitations   Ropinirole Nausea Only        Medication List     STOP taking these medications    omeprazole 20 MG tablet Commonly known as: PRILOSEC OTC       TAKE these medications    acetaminophen 500 MG tablet Commonly known as: TYLENOL Take 500 mg by mouth every 4 (four) hours as needed for mild pain, moderate pain, headache or fever.   albuterol 108 (90 Base) MCG/ACT inhaler Commonly known as: VENTOLIN HFA Inhale 2 puffs into the lungs every 6 (six) hours as needed for wheezing or shortness of breath.   ALPRAZolam 0.25 MG tablet Commonly known as: XANAX Take 1 tablet (0.25 mg total) by mouth 2 (two) times daily as needed.   amLODipine 5 MG tablet Commonly known as: NORVASC Take 1 tablet (5 mg total) by mouth daily. Start taking on: May 13, 2022 What changed:  medication strength how much to take   aspirin 81 MG tablet Take 81 mg by mouth daily.   bimatoprost 0.03 % ophthalmic solution Commonly known as: LATISSE Place 1 drop into both eyes daily as needed (glaucoma). Place one drop on applicator and apply evenly along the skin of the upper eyelid at base of eyelashes once daily at bedtime; repeat procedure for second eye (use a clean applicator).   Calcium Carb-Cholecalciferol 600-10 MG-MCG Tabs Take 1 tablet by mouth daily.   calcium carbonate 500 MG chewable tablet Commonly known as: TUMS - dosed in mg elemental calcium Chew 1 tablet by mouth daily.   conjugated estrogens vaginal cream Commonly known as: PREMARIN Place 1 applicator vaginally every other day.   ferrous sulfate 325 (65 FE) MG tablet Take 1 tablet (325 mg total) by mouth  daily with breakfast. What changed: when to take this   FLUoxetine 10 MG tablet Commonly known as: PROZAC Take 10 mg by mouth daily.   fluticasone 50 MCG/ACT nasal spray Commonly known as: FLONASE Place 2 sprays into both nostrils daily.   furosemide 20 MG tablet Commonly known as: LASIX Take 1 tablet (20 mg total) by mouth daily. Please hold until diarrhea completely resolved What changed: additional instructions   levothyroxine 25 MCG tablet Commonly known as: SYNTHROID Take 50 mcg by mouth daily before breakfast.   lisinopril 20 MG tablet Commonly known as: ZESTRIL Take 1 tablet (20 mg total) by mouth daily. Start taking on: May 13, 2022 What changed: when to take this   metoprolol succinate 25 MG 24 hr tablet Commonly known as: TOPROL-XL Take 3 tablets (75 mg total) by mouth daily. Start taking on: May 13, 2022 What changed:  how much to take when to take this   multivitamin with minerals Tabs tablet Take 1 tablet by mouth daily.   ondansetron 4 MG disintegrating tablet Commonly known as: ZOFRAN-ODT Take 4 mg by mouth every 8 (eight) hours as needed for vomiting or nausea.   pantoprazole 40 MG  tablet Commonly known as: PROTONIX Take 1 tablet (40 mg total) by mouth 2 (two) times daily.   Rhopressa 0.02 % Soln Generic drug: Netarsudil Dimesylate Place 1 drop into both eyes at bedtime.   simvastatin 5 MG tablet Commonly known as: ZOCOR Take 5 mg by mouth at bedtime.   Symbicort 80-4.5 MCG/ACT inhaler Generic drug: budesonide-formoterol Inhale 2 puffs into the lungs daily at 6 (six) AM.   Vitamin B-Complex Tabs Take 1 tablet by mouth daily.   Vyzulta 0.024 % Soln Generic drug: Latanoprostene Bunod Place 1 drop into both eyes at bedtime.        Contact information for after-discharge care     Destination     [facility] Preferred SNF .   Service: Skilled Nursing Contact information: [address] [place]  [place] [address] [phone]                    Discharge Exam: Ceasar Mons Weights   05/04/22 1843 05/05/22 0141 05/05/22 1025  Weight: 77.1 kg 74.8 kg 74.8 kg   HEENT:  [place]/AT, No thrush, no icterus CV:  RRR, no rub, no S3, no S4 Lung:  fine bibasilar rales. No wheeze Abd:  soft/+BS, NT Ext:  No edema, no lymphangitis, no synovitis, no rash   Condition at discharge: stable  The results of significant diagnostics from this hospitalization (including imaging, microbiology, ancillary and laboratory) are listed below for reference.   Imaging Studies: DG Abd 1 View  Result Date: 05/07/2022 CLINICAL DATA:  Constipation, abdominal pain EXAM: ABDOMEN - 1 VIEW COMPARISON:  None Available. FINDINGS: Included bowel gas pattern is unremarkable. No significant stool burden. IMPRESSION: Unremarkable bowel gas pattern. Electronically Signed   By: Guadlupe Spanish M.D.   On: 05/07/2022 13:35   CT Abdomen Pelvis W Contrast  Result Date: 05/05/2022 CLINICAL DATA:  Abdominal pain, acute, nonlocalized EXAM: CT ABDOMEN AND PELVIS WITH CONTRAST TECHNIQUE: Multidetector CT imaging of the abdomen and pelvis was performed using the standard protocol following bolus administration of intravenous contrast. RADIATION DOSE REDUCTION: This exam was performed according to the departmental dose-optimization program which includes automated exposure control, adjustment of the mA and/or kV according to patient size and/or use of iterative reconstruction technique. CONTRAST:  53mL OMNIPAQUE IOHEXOL 300 MG/ML  SOLN COMPARISON:  04/06/2022 FINDINGS: Lower chest: Moderate-sized hiatal hernia.  No acute abnormality. Hepatobiliary: No focal liver abnormality is seen. Status post cholecystectomy. No biliary dilatation. Pancreas: No focal abnormality or ductal dilatation. Spleen: No focal abnormality.  Normal size. Adrenals/Urinary Tract: No adrenal abnormality. No focal renal abnormality. No stones or hydronephrosis. Urinary  bladder is unremarkable. Stomach/Bowel: Few scattered sigmoid diverticula. No active diverticulitis. Stomach and small bowel decompressed, unremarkable. Vascular/Lymphatic: Scattered aortic calcifications. No evidence of aneurysm or adenopathy. Reproductive: Prior hysterectomy.  No adnexal masses. Other: No free fluid or free air. Musculoskeletal: No acute bony abnormality. IMPRESSION: No acute findings in the abdomen or pelvis. Sigmoid diverticulosis. Moderate-sized hiatal hernia. Electronically Signed   By: Charlett Nose M.D.   On: 05/05/2022 01:56    Microbiology: Results for orders placed or performed during the hospital encounter of 05/04/22  Urine Culture     Status: Abnormal   Collection Time: 05/05/22  1:26 AM   Specimen: Urine, Clean Catch  Result Value Ref Range Status   Specimen Description   Final    URINE, CLEAN CATCH Performed at [facility], [address], [place], [place] [address]    Special Requests  Final    NONE Performed at [facility], [address] [place], [place] [address]    Culture MULTIPLE SPECIES PRESENT, SUGGEST RECOLLECTION (A)  Final   Report Status 05/06/2022 FINAL  Final    Labs: CBC: Recent Labs  Lab 05/06/22 0509 05/07/22 0442 05/08/22 0412 05/09/22 0625 05/11/22 0418  WBC 12.3* 8.5 8.7 9.4 12.3*  HGB 7.3* 7.9* 8.3* 8.8* 8.4*  HCT 23.5* 25.6* 27.3* 28.0* 26.3*  MCV 90.0 90.1 90.4 87.8 86.8  PLT 498* 494* 492* 471* 407*   Basic Metabolic Panel: Recent Labs  Lab 05/06/22 0429 05/07/22 0442 05/08/22 0412  NA 135 135 136  K 3.7 3.5 3.5  CL 105 106 108  CO2 24 23 23   GLUCOSE 117* 86 101*  BUN 9 5* 5*  CREATININE 0.61 0.54 0.56  CALCIUM 8.5* 8.4* 8.4*  MG 1.8 1.7 2.1   Liver Function Tests: No results for input(s): "AST", "ALT", "ALKPHOS", "BILITOT", "PROT", "ALBUMIN" in the last 168 hours. CBG: No results for input(s): "GLUCAP" in the last 168 hours.  Discharge time spent: greater than 30 minutes.  Signed: ,  MD [facility] 05/12/2022

## 2022-05-12 NOTE — Plan of Care (Signed)
Pt alert and oriented x 4, Pt continues to have poor intake. Boost breeze offered and placed at bedside this shift. Pt refused. Zofran continues every 6 hours for nausea. Redness noted to folds and peri.  Problem: Education: Goal: Knowledge of General Education information will improve Description: Including pain rating scale, medication(s)/side effects and non-pharmacologic comfort measures Outcome: Progressing   Problem: Health Behavior/Discharge Planning: Goal: Ability to manage health-related needs will improve Outcome: Progressing   Problem: Clinical Measurements: Goal: Ability to maintain clinical measurements within normal limits will improve Outcome: Progressing Goal: Will remain free from infection Outcome: Progressing Goal: Diagnostic test results will improve Outcome: Progressing Goal: Respiratory complications will improve Outcome: Progressing Goal: Cardiovascular complication will be avoided Outcome: Progressing   Problem: Coping: Goal: Level of anxiety will decrease Outcome: Progressing   Problem: Pain Managment: Goal: General experience of comfort will improve Outcome: Progressing

## 2022-05-12 NOTE — Assessment & Plan Note (Signed)
Continue amlodipine>>change to 5 mg daily Continue metoprolol succinate>>change to 75 mg q AM Continue lisinopril>>change to once daily

## 2022-05-12 NOTE — Progress Notes (Signed)
Nsg Discharge Note  Admit Date:  05/04/2022 Discharge date: 05/12/2022   [facility] to be D/C'd Skilled nursing facility per MD order.  AVS completed.  Copy for chart, and copy for patient signed, and dated. Patient/caregiver able to verbalize understanding.  Discharge Medication: Allergies as of 05/12/2022       Reactions   Contrast Media [iodinated Contrast Media] Shortness Of Breath   Iodine Shortness Of Breath   Sulfasalazine Itching, Swelling   Dye Fdc Red [red Dye]    Lactose Intolerance (gi)    GI upset   Penicillins    Childhood event-unknown reaction   Shellfish Allergy Nausea Only, Swelling   Throat swelling   Sulfa Antibiotics Itching, Swelling   Venlafaxine Swelling   Throat   Codeine Palpitations   Ropinirole Nausea Only        Medication List     STOP taking these medications    omeprazole 20 MG tablet Commonly known as: PRILOSEC OTC       TAKE these medications    acetaminophen 500 MG tablet Commonly known as: TYLENOL Take 500 mg by mouth every 4 (four) hours as needed for mild pain, moderate pain, headache or fever.   albuterol 108 (90 Base) MCG/ACT inhaler Commonly known as: VENTOLIN HFA Inhale 2 puffs into the lungs every 6 (six) hours as needed for wheezing or shortness of breath.   ALPRAZolam 0.25 MG tablet Commonly known as: XANAX Take 1 tablet (0.25 mg total) by mouth 2 (two) times daily as needed.   amLODipine 5 MG tablet Commonly known as: NORVASC Take 1 tablet (5 mg total) by mouth daily. Start taking on: May 13, 2022 What changed:  medication strength how much to take   aspirin 81 MG tablet Take 81 mg by mouth daily.   bimatoprost 0.03 % ophthalmic solution Commonly known as: LATISSE Place 1 drop into both eyes daily as needed (glaucoma). Place one drop on applicator and apply evenly along the skin of the upper eyelid at base of eyelashes once daily at bedtime; repeat procedure for second eye (use a clean  applicator).   Calcium Carb-Cholecalciferol 600-10 MG-MCG Tabs Take 1 tablet by mouth daily.   calcium carbonate 500 MG chewable tablet Commonly known as: TUMS - dosed in mg elemental calcium Chew 1 tablet by mouth daily.   conjugated estrogens vaginal cream Commonly known as: PREMARIN Place 1 applicator vaginally every other day.   ferrous sulfate 325 (65 FE) MG tablet Take 1 tablet (325 mg total) by mouth daily with breakfast. What changed: when to take this   FLUoxetine 10 MG tablet Commonly known as: PROZAC Take 10 mg by mouth daily.   fluticasone 50 MCG/ACT nasal spray Commonly known as: FLONASE Place 2 sprays into both nostrils daily.   furosemide 20 MG tablet Commonly known as: LASIX Take 1 tablet (20 mg total) by mouth daily. Please hold until diarrhea completely resolved What changed: additional instructions   levothyroxine 25 MCG tablet Commonly known as: SYNTHROID Take 50 mcg by mouth daily before breakfast.   lisinopril 20 MG tablet Commonly known as: ZESTRIL Take 1 tablet (20 mg total) by mouth daily. Start taking on: May 13, 2022 What changed: when to take this   metoprolol succinate 25 MG 24 hr tablet Commonly known as: TOPROL-XL Take 3 tablets (75 mg total) by mouth daily. Start taking on: May 13, 2022 What changed:  how much to take when to take this   multivitamin with minerals Tabs tablet  Take 1 tablet by mouth daily.   ondansetron 4 MG disintegrating tablet Commonly known as: ZOFRAN-ODT Take 4 mg by mouth every 8 (eight) hours as needed for vomiting or nausea.   pantoprazole 40 MG tablet Commonly known as: PROTONIX Take 1 tablet (40 mg total) by mouth 2 (two) times daily.   Rhopressa 0.02 % Soln Generic drug: Netarsudil Dimesylate Place 1 drop into both eyes at bedtime.   simvastatin 5 MG tablet Commonly known as: ZOCOR Take 5 mg by mouth at bedtime.   Symbicort 80-4.5 MCG/ACT inhaler Generic drug: budesonide-formoterol Inhale  2 puffs into the lungs daily at 6 (six) AM.   Vitamin B-Complex Tabs Take 1 tablet by mouth daily.   Vyzulta 0.024 % Soln Generic drug: Latanoprostene Bunod Place 1 drop into both eyes at bedtime.        Discharge Assessment: Vitals:   05/12/22 0839 05/12/22 0840  BP: (!) 139/47 (!) 139/47  Pulse:  91  Resp:    Temp:    SpO2:     Skin clean, dry and intact without evidence of skin break down, no evidence of skin tears noted. IV catheter discontinued intact. Site without signs and symptoms of complications - no redness or edema noted at insertion site, patient denies c/o pain - only slight tenderness at site.  Dressing with slight pressure applied.  D/c Instructions-Education: Discharge instructions given to patient/family with verbalized understanding. D/c education completed with patient/family including follow up instructions, medication list, d/c activities limitations if indicated, with other d/c instructions as indicated by MD - patient able to verbalize understanding, all questions fully answered. Patient instructed to return to ED, call 911, or call MD for any changes in condition.  Patient escorted via WC, and D/C home via private auto.  Brandy Hale, LPN 03/24/7740 28:78 AM

## 2022-05-12 NOTE — Progress Notes (Signed)
Patient discharged to [facility] in [place], report called and given to  [employer] LPN. Transported by EMS of [facility] to awaiting facility.Family at bedside.

## 2022-05-13 DIAGNOSIS — R195 Other fecal abnormalities: Secondary | ICD-10-CM | POA: Diagnosis not present

## 2022-05-13 DIAGNOSIS — E871 Hypo-osmolality and hyponatremia: Secondary | ICD-10-CM | POA: Diagnosis not present

## 2022-05-13 DIAGNOSIS — E876 Hypokalemia: Secondary | ICD-10-CM | POA: Diagnosis not present

## 2022-05-13 DIAGNOSIS — M6281 Muscle weakness (generalized): Secondary | ICD-10-CM | POA: Diagnosis not present

## 2022-05-16 DIAGNOSIS — E871 Hypo-osmolality and hyponatremia: Secondary | ICD-10-CM | POA: Diagnosis not present

## 2022-05-16 DIAGNOSIS — E876 Hypokalemia: Secondary | ICD-10-CM | POA: Diagnosis not present

## 2022-05-16 DIAGNOSIS — K5792 Diverticulitis of intestine, part unspecified, without perforation or abscess without bleeding: Secondary | ICD-10-CM | POA: Diagnosis not present

## 2022-05-21 DIAGNOSIS — K5792 Diverticulitis of intestine, part unspecified, without perforation or abscess without bleeding: Secondary | ICD-10-CM | POA: Diagnosis not present

## 2022-05-27 DIAGNOSIS — K5792 Diverticulitis of intestine, part unspecified, without perforation or abscess without bleeding: Secondary | ICD-10-CM | POA: Diagnosis not present

## 2022-05-27 DIAGNOSIS — E876 Hypokalemia: Secondary | ICD-10-CM | POA: Diagnosis not present

## 2022-05-27 DIAGNOSIS — E871 Hypo-osmolality and hyponatremia: Secondary | ICD-10-CM | POA: Diagnosis not present

## 2022-05-27 DIAGNOSIS — I10 Essential (primary) hypertension: Secondary | ICD-10-CM | POA: Diagnosis not present

## 2022-05-28 DIAGNOSIS — I10 Essential (primary) hypertension: Secondary | ICD-10-CM | POA: Diagnosis not present

## 2022-05-28 DIAGNOSIS — K5792 Diverticulitis of intestine, part unspecified, without perforation or abscess without bleeding: Secondary | ICD-10-CM | POA: Diagnosis not present

## 2022-05-28 DIAGNOSIS — K219 Gastro-esophageal reflux disease without esophagitis: Secondary | ICD-10-CM | POA: Diagnosis not present

## 2022-05-28 DIAGNOSIS — M6281 Muscle weakness (generalized): Secondary | ICD-10-CM | POA: Diagnosis not present

## 2022-06-02 DIAGNOSIS — K5792 Diverticulitis of intestine, part unspecified, without perforation or abscess without bleeding: Secondary | ICD-10-CM | POA: Diagnosis not present

## 2022-06-02 DIAGNOSIS — I10 Essential (primary) hypertension: Secondary | ICD-10-CM | POA: Diagnosis not present

## 2022-06-02 DIAGNOSIS — E871 Hypo-osmolality and hyponatremia: Secondary | ICD-10-CM | POA: Diagnosis not present

## 2022-06-02 DIAGNOSIS — F419 Anxiety disorder, unspecified: Secondary | ICD-10-CM | POA: Diagnosis not present

## 2022-06-04 DIAGNOSIS — E871 Hypo-osmolality and hyponatremia: Secondary | ICD-10-CM | POA: Diagnosis not present

## 2022-06-04 DIAGNOSIS — E876 Hypokalemia: Secondary | ICD-10-CM | POA: Diagnosis not present

## 2022-06-04 DIAGNOSIS — I10 Essential (primary) hypertension: Secondary | ICD-10-CM | POA: Diagnosis not present

## 2022-06-10 DIAGNOSIS — R2681 Unsteadiness on feet: Secondary | ICD-10-CM | POA: Diagnosis not present

## 2022-06-10 DIAGNOSIS — I10 Essential (primary) hypertension: Secondary | ICD-10-CM | POA: Diagnosis not present

## 2022-06-10 DIAGNOSIS — F419 Anxiety disorder, unspecified: Secondary | ICD-10-CM | POA: Diagnosis not present

## 2022-06-10 DIAGNOSIS — J45998 Other asthma: Secondary | ICD-10-CM | POA: Diagnosis not present

## 2022-06-10 DIAGNOSIS — Z741 Need for assistance with personal care: Secondary | ICD-10-CM | POA: Diagnosis not present

## 2022-06-10 DIAGNOSIS — E569 Vitamin deficiency, unspecified: Secondary | ICD-10-CM | POA: Diagnosis not present

## 2022-06-10 DIAGNOSIS — E785 Hyperlipidemia, unspecified: Secondary | ICD-10-CM | POA: Diagnosis not present

## 2022-06-10 DIAGNOSIS — F32A Depression, unspecified: Secondary | ICD-10-CM | POA: Diagnosis not present

## 2022-06-10 DIAGNOSIS — E039 Hypothyroidism, unspecified: Secondary | ICD-10-CM | POA: Diagnosis not present

## 2022-06-10 DIAGNOSIS — M6259 Muscle wasting and atrophy, not elsewhere classified, multiple sites: Secondary | ICD-10-CM | POA: Diagnosis not present

## 2022-06-12 DIAGNOSIS — F419 Anxiety disorder, unspecified: Secondary | ICD-10-CM | POA: Diagnosis not present

## 2022-06-12 DIAGNOSIS — E039 Hypothyroidism, unspecified: Secondary | ICD-10-CM | POA: Diagnosis not present

## 2022-06-12 DIAGNOSIS — R2681 Unsteadiness on feet: Secondary | ICD-10-CM | POA: Diagnosis not present

## 2022-06-12 DIAGNOSIS — Z741 Need for assistance with personal care: Secondary | ICD-10-CM | POA: Diagnosis not present

## 2022-06-12 DIAGNOSIS — E785 Hyperlipidemia, unspecified: Secondary | ICD-10-CM | POA: Diagnosis not present

## 2022-06-12 DIAGNOSIS — F32A Depression, unspecified: Secondary | ICD-10-CM | POA: Diagnosis not present

## 2022-06-12 DIAGNOSIS — I10 Essential (primary) hypertension: Secondary | ICD-10-CM | POA: Diagnosis not present

## 2022-06-12 DIAGNOSIS — M6259 Muscle wasting and atrophy, not elsewhere classified, multiple sites: Secondary | ICD-10-CM | POA: Diagnosis not present

## 2022-06-12 DIAGNOSIS — E569 Vitamin deficiency, unspecified: Secondary | ICD-10-CM | POA: Diagnosis not present

## 2022-06-12 DIAGNOSIS — J45998 Other asthma: Secondary | ICD-10-CM | POA: Diagnosis not present

## 2022-06-15 DIAGNOSIS — E569 Vitamin deficiency, unspecified: Secondary | ICD-10-CM | POA: Diagnosis not present

## 2022-06-15 DIAGNOSIS — J45998 Other asthma: Secondary | ICD-10-CM | POA: Diagnosis not present

## 2022-06-15 DIAGNOSIS — M6259 Muscle wasting and atrophy, not elsewhere classified, multiple sites: Secondary | ICD-10-CM | POA: Diagnosis not present

## 2022-06-15 DIAGNOSIS — I10 Essential (primary) hypertension: Secondary | ICD-10-CM | POA: Diagnosis not present

## 2022-06-15 DIAGNOSIS — F419 Anxiety disorder, unspecified: Secondary | ICD-10-CM | POA: Diagnosis not present

## 2022-06-15 DIAGNOSIS — E039 Hypothyroidism, unspecified: Secondary | ICD-10-CM | POA: Diagnosis not present

## 2022-06-15 DIAGNOSIS — K5792 Diverticulitis of intestine, part unspecified, without perforation or abscess without bleeding: Secondary | ICD-10-CM | POA: Diagnosis not present

## 2022-06-15 DIAGNOSIS — F32A Depression, unspecified: Secondary | ICD-10-CM | POA: Diagnosis not present

## 2022-06-15 DIAGNOSIS — E785 Hyperlipidemia, unspecified: Secondary | ICD-10-CM | POA: Diagnosis not present

## 2022-06-15 DIAGNOSIS — R2681 Unsteadiness on feet: Secondary | ICD-10-CM | POA: Diagnosis not present

## 2022-06-15 DIAGNOSIS — E871 Hypo-osmolality and hyponatremia: Secondary | ICD-10-CM | POA: Diagnosis not present

## 2022-06-15 DIAGNOSIS — Z741 Need for assistance with personal care: Secondary | ICD-10-CM | POA: Diagnosis not present

## 2022-06-17 DIAGNOSIS — F32A Depression, unspecified: Secondary | ICD-10-CM | POA: Diagnosis not present

## 2022-06-17 DIAGNOSIS — E039 Hypothyroidism, unspecified: Secondary | ICD-10-CM | POA: Diagnosis not present

## 2022-06-17 DIAGNOSIS — E569 Vitamin deficiency, unspecified: Secondary | ICD-10-CM | POA: Diagnosis not present

## 2022-06-17 DIAGNOSIS — K5792 Diverticulitis of intestine, part unspecified, without perforation or abscess without bleeding: Secondary | ICD-10-CM | POA: Diagnosis not present

## 2022-06-17 DIAGNOSIS — M6259 Muscle wasting and atrophy, not elsewhere classified, multiple sites: Secondary | ICD-10-CM | POA: Diagnosis not present

## 2022-06-17 DIAGNOSIS — F419 Anxiety disorder, unspecified: Secondary | ICD-10-CM | POA: Diagnosis not present

## 2022-06-17 DIAGNOSIS — I10 Essential (primary) hypertension: Secondary | ICD-10-CM | POA: Diagnosis not present

## 2022-06-17 DIAGNOSIS — K219 Gastro-esophageal reflux disease without esophagitis: Secondary | ICD-10-CM | POA: Diagnosis not present

## 2022-06-17 DIAGNOSIS — M6281 Muscle weakness (generalized): Secondary | ICD-10-CM | POA: Diagnosis not present

## 2022-06-17 DIAGNOSIS — E785 Hyperlipidemia, unspecified: Secondary | ICD-10-CM | POA: Diagnosis not present

## 2022-06-17 DIAGNOSIS — Z741 Need for assistance with personal care: Secondary | ICD-10-CM | POA: Diagnosis not present

## 2022-06-17 DIAGNOSIS — R2681 Unsteadiness on feet: Secondary | ICD-10-CM | POA: Diagnosis not present

## 2022-06-17 DIAGNOSIS — J45998 Other asthma: Secondary | ICD-10-CM | POA: Diagnosis not present

## 2022-06-19 DIAGNOSIS — R2681 Unsteadiness on feet: Secondary | ICD-10-CM | POA: Diagnosis not present

## 2022-06-19 DIAGNOSIS — E785 Hyperlipidemia, unspecified: Secondary | ICD-10-CM | POA: Diagnosis not present

## 2022-06-19 DIAGNOSIS — F419 Anxiety disorder, unspecified: Secondary | ICD-10-CM | POA: Diagnosis not present

## 2022-06-19 DIAGNOSIS — F32A Depression, unspecified: Secondary | ICD-10-CM | POA: Diagnosis not present

## 2022-06-19 DIAGNOSIS — E569 Vitamin deficiency, unspecified: Secondary | ICD-10-CM | POA: Diagnosis not present

## 2022-06-19 DIAGNOSIS — J45998 Other asthma: Secondary | ICD-10-CM | POA: Diagnosis not present

## 2022-06-19 DIAGNOSIS — I10 Essential (primary) hypertension: Secondary | ICD-10-CM | POA: Diagnosis not present

## 2022-06-19 DIAGNOSIS — Z741 Need for assistance with personal care: Secondary | ICD-10-CM | POA: Diagnosis not present

## 2022-06-19 DIAGNOSIS — E039 Hypothyroidism, unspecified: Secondary | ICD-10-CM | POA: Diagnosis not present

## 2022-06-19 DIAGNOSIS — M6259 Muscle wasting and atrophy, not elsewhere classified, multiple sites: Secondary | ICD-10-CM | POA: Diagnosis not present

## 2022-06-22 DIAGNOSIS — E569 Vitamin deficiency, unspecified: Secondary | ICD-10-CM | POA: Diagnosis not present

## 2022-06-22 DIAGNOSIS — Z741 Need for assistance with personal care: Secondary | ICD-10-CM | POA: Diagnosis not present

## 2022-06-22 DIAGNOSIS — E785 Hyperlipidemia, unspecified: Secondary | ICD-10-CM | POA: Diagnosis not present

## 2022-06-22 DIAGNOSIS — F419 Anxiety disorder, unspecified: Secondary | ICD-10-CM | POA: Diagnosis not present

## 2022-06-22 DIAGNOSIS — M6259 Muscle wasting and atrophy, not elsewhere classified, multiple sites: Secondary | ICD-10-CM | POA: Diagnosis not present

## 2022-06-22 DIAGNOSIS — J45998 Other asthma: Secondary | ICD-10-CM | POA: Diagnosis not present

## 2022-06-22 DIAGNOSIS — E039 Hypothyroidism, unspecified: Secondary | ICD-10-CM | POA: Diagnosis not present

## 2022-06-22 DIAGNOSIS — I10 Essential (primary) hypertension: Secondary | ICD-10-CM | POA: Diagnosis not present

## 2022-06-22 DIAGNOSIS — R21 Rash and other nonspecific skin eruption: Secondary | ICD-10-CM | POA: Diagnosis not present

## 2022-06-22 DIAGNOSIS — R2681 Unsteadiness on feet: Secondary | ICD-10-CM | POA: Diagnosis not present

## 2022-06-22 DIAGNOSIS — F32A Depression, unspecified: Secondary | ICD-10-CM | POA: Diagnosis not present

## 2022-06-24 DIAGNOSIS — I10 Essential (primary) hypertension: Secondary | ICD-10-CM | POA: Diagnosis not present

## 2022-06-24 DIAGNOSIS — K5792 Diverticulitis of intestine, part unspecified, without perforation or abscess without bleeding: Secondary | ICD-10-CM | POA: Diagnosis not present

## 2022-06-24 DIAGNOSIS — E039 Hypothyroidism, unspecified: Secondary | ICD-10-CM | POA: Diagnosis not present

## 2022-06-24 DIAGNOSIS — E569 Vitamin deficiency, unspecified: Secondary | ICD-10-CM | POA: Diagnosis not present

## 2022-06-24 DIAGNOSIS — M6259 Muscle wasting and atrophy, not elsewhere classified, multiple sites: Secondary | ICD-10-CM | POA: Diagnosis not present

## 2022-06-24 DIAGNOSIS — R2681 Unsteadiness on feet: Secondary | ICD-10-CM | POA: Diagnosis not present

## 2022-06-24 DIAGNOSIS — F419 Anxiety disorder, unspecified: Secondary | ICD-10-CM | POA: Diagnosis not present

## 2022-06-24 DIAGNOSIS — J45998 Other asthma: Secondary | ICD-10-CM | POA: Diagnosis not present

## 2022-06-24 DIAGNOSIS — E876 Hypokalemia: Secondary | ICD-10-CM | POA: Diagnosis not present

## 2022-06-24 DIAGNOSIS — Z741 Need for assistance with personal care: Secondary | ICD-10-CM | POA: Diagnosis not present

## 2022-06-24 DIAGNOSIS — E785 Hyperlipidemia, unspecified: Secondary | ICD-10-CM | POA: Diagnosis not present

## 2022-06-24 DIAGNOSIS — F32A Depression, unspecified: Secondary | ICD-10-CM | POA: Diagnosis not present

## 2022-06-26 DIAGNOSIS — F32A Depression, unspecified: Secondary | ICD-10-CM | POA: Diagnosis not present

## 2022-06-26 DIAGNOSIS — K5792 Diverticulitis of intestine, part unspecified, without perforation or abscess without bleeding: Secondary | ICD-10-CM | POA: Diagnosis not present

## 2022-06-26 DIAGNOSIS — Z741 Need for assistance with personal care: Secondary | ICD-10-CM | POA: Diagnosis not present

## 2022-06-26 DIAGNOSIS — F419 Anxiety disorder, unspecified: Secondary | ICD-10-CM | POA: Diagnosis not present

## 2022-06-26 DIAGNOSIS — E785 Hyperlipidemia, unspecified: Secondary | ICD-10-CM | POA: Diagnosis not present

## 2022-06-26 DIAGNOSIS — M6259 Muscle wasting and atrophy, not elsewhere classified, multiple sites: Secondary | ICD-10-CM | POA: Diagnosis not present

## 2022-06-26 DIAGNOSIS — J45998 Other asthma: Secondary | ICD-10-CM | POA: Diagnosis not present

## 2022-06-26 DIAGNOSIS — R2681 Unsteadiness on feet: Secondary | ICD-10-CM | POA: Diagnosis not present

## 2022-06-26 DIAGNOSIS — I10 Essential (primary) hypertension: Secondary | ICD-10-CM | POA: Diagnosis not present

## 2022-06-26 DIAGNOSIS — E039 Hypothyroidism, unspecified: Secondary | ICD-10-CM | POA: Diagnosis not present

## 2022-06-26 DIAGNOSIS — E569 Vitamin deficiency, unspecified: Secondary | ICD-10-CM | POA: Diagnosis not present

## 2022-06-26 DIAGNOSIS — E876 Hypokalemia: Secondary | ICD-10-CM | POA: Diagnosis not present

## 2022-06-29 DIAGNOSIS — F32A Depression, unspecified: Secondary | ICD-10-CM | POA: Diagnosis not present

## 2022-06-29 DIAGNOSIS — J45998 Other asthma: Secondary | ICD-10-CM | POA: Diagnosis not present

## 2022-06-29 DIAGNOSIS — F419 Anxiety disorder, unspecified: Secondary | ICD-10-CM | POA: Diagnosis not present

## 2022-06-29 DIAGNOSIS — E876 Hypokalemia: Secondary | ICD-10-CM | POA: Diagnosis not present

## 2022-06-29 DIAGNOSIS — M6259 Muscle wasting and atrophy, not elsewhere classified, multiple sites: Secondary | ICD-10-CM | POA: Diagnosis not present

## 2022-06-29 DIAGNOSIS — E785 Hyperlipidemia, unspecified: Secondary | ICD-10-CM | POA: Diagnosis not present

## 2022-06-29 DIAGNOSIS — E569 Vitamin deficiency, unspecified: Secondary | ICD-10-CM | POA: Diagnosis not present

## 2022-06-29 DIAGNOSIS — E039 Hypothyroidism, unspecified: Secondary | ICD-10-CM | POA: Diagnosis not present

## 2022-06-29 DIAGNOSIS — Z741 Need for assistance with personal care: Secondary | ICD-10-CM | POA: Diagnosis not present

## 2022-06-29 DIAGNOSIS — R2681 Unsteadiness on feet: Secondary | ICD-10-CM | POA: Diagnosis not present

## 2022-06-29 DIAGNOSIS — I10 Essential (primary) hypertension: Secondary | ICD-10-CM | POA: Diagnosis not present

## 2022-06-29 DIAGNOSIS — K5792 Diverticulitis of intestine, part unspecified, without perforation or abscess without bleeding: Secondary | ICD-10-CM | POA: Diagnosis not present

## 2022-07-01 DIAGNOSIS — E876 Hypokalemia: Secondary | ICD-10-CM | POA: Diagnosis not present

## 2022-07-01 DIAGNOSIS — J45998 Other asthma: Secondary | ICD-10-CM | POA: Diagnosis not present

## 2022-07-01 DIAGNOSIS — K5792 Diverticulitis of intestine, part unspecified, without perforation or abscess without bleeding: Secondary | ICD-10-CM | POA: Diagnosis not present

## 2022-07-01 DIAGNOSIS — R2681 Unsteadiness on feet: Secondary | ICD-10-CM | POA: Diagnosis not present

## 2022-07-01 DIAGNOSIS — E785 Hyperlipidemia, unspecified: Secondary | ICD-10-CM | POA: Diagnosis not present

## 2022-07-01 DIAGNOSIS — M6259 Muscle wasting and atrophy, not elsewhere classified, multiple sites: Secondary | ICD-10-CM | POA: Diagnosis not present

## 2022-07-01 DIAGNOSIS — Z741 Need for assistance with personal care: Secondary | ICD-10-CM | POA: Diagnosis not present

## 2022-07-01 DIAGNOSIS — F419 Anxiety disorder, unspecified: Secondary | ICD-10-CM | POA: Diagnosis not present

## 2022-07-01 DIAGNOSIS — I10 Essential (primary) hypertension: Secondary | ICD-10-CM | POA: Diagnosis not present

## 2022-07-01 DIAGNOSIS — F32A Depression, unspecified: Secondary | ICD-10-CM | POA: Diagnosis not present

## 2022-07-01 DIAGNOSIS — E039 Hypothyroidism, unspecified: Secondary | ICD-10-CM | POA: Diagnosis not present

## 2022-07-01 DIAGNOSIS — E569 Vitamin deficiency, unspecified: Secondary | ICD-10-CM | POA: Diagnosis not present

## 2022-07-03 DIAGNOSIS — E569 Vitamin deficiency, unspecified: Secondary | ICD-10-CM | POA: Diagnosis not present

## 2022-07-03 DIAGNOSIS — R2681 Unsteadiness on feet: Secondary | ICD-10-CM | POA: Diagnosis not present

## 2022-07-03 DIAGNOSIS — E785 Hyperlipidemia, unspecified: Secondary | ICD-10-CM | POA: Diagnosis not present

## 2022-07-03 DIAGNOSIS — K5792 Diverticulitis of intestine, part unspecified, without perforation or abscess without bleeding: Secondary | ICD-10-CM | POA: Diagnosis not present

## 2022-07-03 DIAGNOSIS — M6259 Muscle wasting and atrophy, not elsewhere classified, multiple sites: Secondary | ICD-10-CM | POA: Diagnosis not present

## 2022-07-03 DIAGNOSIS — I10 Essential (primary) hypertension: Secondary | ICD-10-CM | POA: Diagnosis not present

## 2022-07-03 DIAGNOSIS — E876 Hypokalemia: Secondary | ICD-10-CM | POA: Diagnosis not present

## 2022-07-03 DIAGNOSIS — J45998 Other asthma: Secondary | ICD-10-CM | POA: Diagnosis not present

## 2022-07-03 DIAGNOSIS — E039 Hypothyroidism, unspecified: Secondary | ICD-10-CM | POA: Diagnosis not present

## 2022-07-03 DIAGNOSIS — Z741 Need for assistance with personal care: Secondary | ICD-10-CM | POA: Diagnosis not present

## 2022-07-03 DIAGNOSIS — F419 Anxiety disorder, unspecified: Secondary | ICD-10-CM | POA: Diagnosis not present

## 2022-07-03 DIAGNOSIS — F32A Depression, unspecified: Secondary | ICD-10-CM | POA: Diagnosis not present

## 2022-07-06 DIAGNOSIS — M6259 Muscle wasting and atrophy, not elsewhere classified, multiple sites: Secondary | ICD-10-CM | POA: Diagnosis not present

## 2022-07-06 DIAGNOSIS — E785 Hyperlipidemia, unspecified: Secondary | ICD-10-CM | POA: Diagnosis not present

## 2022-07-06 DIAGNOSIS — R2681 Unsteadiness on feet: Secondary | ICD-10-CM | POA: Diagnosis not present

## 2022-07-06 DIAGNOSIS — Z741 Need for assistance with personal care: Secondary | ICD-10-CM | POA: Diagnosis not present

## 2022-07-06 DIAGNOSIS — E876 Hypokalemia: Secondary | ICD-10-CM | POA: Diagnosis not present

## 2022-07-06 DIAGNOSIS — E569 Vitamin deficiency, unspecified: Secondary | ICD-10-CM | POA: Diagnosis not present

## 2022-07-06 DIAGNOSIS — J45998 Other asthma: Secondary | ICD-10-CM | POA: Diagnosis not present

## 2022-07-06 DIAGNOSIS — I10 Essential (primary) hypertension: Secondary | ICD-10-CM | POA: Diagnosis not present

## 2022-07-06 DIAGNOSIS — F32A Depression, unspecified: Secondary | ICD-10-CM | POA: Diagnosis not present

## 2022-07-06 DIAGNOSIS — E039 Hypothyroidism, unspecified: Secondary | ICD-10-CM | POA: Diagnosis not present

## 2022-07-06 DIAGNOSIS — F419 Anxiety disorder, unspecified: Secondary | ICD-10-CM | POA: Diagnosis not present

## 2022-07-06 DIAGNOSIS — K5792 Diverticulitis of intestine, part unspecified, without perforation or abscess without bleeding: Secondary | ICD-10-CM | POA: Diagnosis not present

## 2022-07-08 DIAGNOSIS — J45998 Other asthma: Secondary | ICD-10-CM | POA: Diagnosis not present

## 2022-07-08 DIAGNOSIS — E876 Hypokalemia: Secondary | ICD-10-CM | POA: Diagnosis not present

## 2022-07-08 DIAGNOSIS — E569 Vitamin deficiency, unspecified: Secondary | ICD-10-CM | POA: Diagnosis not present

## 2022-07-08 DIAGNOSIS — Z741 Need for assistance with personal care: Secondary | ICD-10-CM | POA: Diagnosis not present

## 2022-07-08 DIAGNOSIS — M6259 Muscle wasting and atrophy, not elsewhere classified, multiple sites: Secondary | ICD-10-CM | POA: Diagnosis not present

## 2022-07-08 DIAGNOSIS — K5792 Diverticulitis of intestine, part unspecified, without perforation or abscess without bleeding: Secondary | ICD-10-CM | POA: Diagnosis not present

## 2022-07-08 DIAGNOSIS — F419 Anxiety disorder, unspecified: Secondary | ICD-10-CM | POA: Diagnosis not present

## 2022-07-08 DIAGNOSIS — R2681 Unsteadiness on feet: Secondary | ICD-10-CM | POA: Diagnosis not present

## 2022-07-08 DIAGNOSIS — E785 Hyperlipidemia, unspecified: Secondary | ICD-10-CM | POA: Diagnosis not present

## 2022-07-08 DIAGNOSIS — I10 Essential (primary) hypertension: Secondary | ICD-10-CM | POA: Diagnosis not present

## 2022-07-08 DIAGNOSIS — F32A Depression, unspecified: Secondary | ICD-10-CM | POA: Diagnosis not present

## 2022-07-08 DIAGNOSIS — E039 Hypothyroidism, unspecified: Secondary | ICD-10-CM | POA: Diagnosis not present

## 2022-07-10 DIAGNOSIS — E039 Hypothyroidism, unspecified: Secondary | ICD-10-CM | POA: Diagnosis not present

## 2022-07-10 DIAGNOSIS — F32A Depression, unspecified: Secondary | ICD-10-CM | POA: Diagnosis not present

## 2022-07-10 DIAGNOSIS — R2681 Unsteadiness on feet: Secondary | ICD-10-CM | POA: Diagnosis not present

## 2022-07-10 DIAGNOSIS — M6259 Muscle wasting and atrophy, not elsewhere classified, multiple sites: Secondary | ICD-10-CM | POA: Diagnosis not present

## 2022-07-10 DIAGNOSIS — I10 Essential (primary) hypertension: Secondary | ICD-10-CM | POA: Diagnosis not present

## 2022-07-10 DIAGNOSIS — K5792 Diverticulitis of intestine, part unspecified, without perforation or abscess without bleeding: Secondary | ICD-10-CM | POA: Diagnosis not present

## 2022-07-10 DIAGNOSIS — F419 Anxiety disorder, unspecified: Secondary | ICD-10-CM | POA: Diagnosis not present

## 2022-07-10 DIAGNOSIS — J45998 Other asthma: Secondary | ICD-10-CM | POA: Diagnosis not present

## 2022-07-10 DIAGNOSIS — Z741 Need for assistance with personal care: Secondary | ICD-10-CM | POA: Diagnosis not present

## 2022-07-10 DIAGNOSIS — E876 Hypokalemia: Secondary | ICD-10-CM | POA: Diagnosis not present

## 2022-07-10 DIAGNOSIS — E785 Hyperlipidemia, unspecified: Secondary | ICD-10-CM | POA: Diagnosis not present

## 2022-07-10 DIAGNOSIS — E569 Vitamin deficiency, unspecified: Secondary | ICD-10-CM | POA: Diagnosis not present

## 2022-07-13 DIAGNOSIS — M6259 Muscle wasting and atrophy, not elsewhere classified, multiple sites: Secondary | ICD-10-CM | POA: Diagnosis not present

## 2022-07-13 DIAGNOSIS — E785 Hyperlipidemia, unspecified: Secondary | ICD-10-CM | POA: Diagnosis not present

## 2022-07-13 DIAGNOSIS — J45998 Other asthma: Secondary | ICD-10-CM | POA: Diagnosis not present

## 2022-07-13 DIAGNOSIS — E569 Vitamin deficiency, unspecified: Secondary | ICD-10-CM | POA: Diagnosis not present

## 2022-07-13 DIAGNOSIS — I10 Essential (primary) hypertension: Secondary | ICD-10-CM | POA: Diagnosis not present

## 2022-07-13 DIAGNOSIS — R2681 Unsteadiness on feet: Secondary | ICD-10-CM | POA: Diagnosis not present

## 2022-07-13 DIAGNOSIS — E039 Hypothyroidism, unspecified: Secondary | ICD-10-CM | POA: Diagnosis not present

## 2022-07-13 DIAGNOSIS — F32A Depression, unspecified: Secondary | ICD-10-CM | POA: Diagnosis not present

## 2022-07-13 DIAGNOSIS — F419 Anxiety disorder, unspecified: Secondary | ICD-10-CM | POA: Diagnosis not present

## 2022-07-13 DIAGNOSIS — K5792 Diverticulitis of intestine, part unspecified, without perforation or abscess without bleeding: Secondary | ICD-10-CM | POA: Diagnosis not present

## 2022-07-13 DIAGNOSIS — E876 Hypokalemia: Secondary | ICD-10-CM | POA: Diagnosis not present

## 2022-07-13 DIAGNOSIS — Z741 Need for assistance with personal care: Secondary | ICD-10-CM | POA: Diagnosis not present

## 2022-07-15 DIAGNOSIS — R2681 Unsteadiness on feet: Secondary | ICD-10-CM | POA: Diagnosis not present

## 2022-07-15 DIAGNOSIS — F32A Depression, unspecified: Secondary | ICD-10-CM | POA: Diagnosis not present

## 2022-07-15 DIAGNOSIS — J45998 Other asthma: Secondary | ICD-10-CM | POA: Diagnosis not present

## 2022-07-15 DIAGNOSIS — I10 Essential (primary) hypertension: Secondary | ICD-10-CM | POA: Diagnosis not present

## 2022-07-15 DIAGNOSIS — E569 Vitamin deficiency, unspecified: Secondary | ICD-10-CM | POA: Diagnosis not present

## 2022-07-15 DIAGNOSIS — E785 Hyperlipidemia, unspecified: Secondary | ICD-10-CM | POA: Diagnosis not present

## 2022-07-15 DIAGNOSIS — E039 Hypothyroidism, unspecified: Secondary | ICD-10-CM | POA: Diagnosis not present

## 2022-07-15 DIAGNOSIS — E876 Hypokalemia: Secondary | ICD-10-CM | POA: Diagnosis not present

## 2022-07-15 DIAGNOSIS — K5792 Diverticulitis of intestine, part unspecified, without perforation or abscess without bleeding: Secondary | ICD-10-CM | POA: Diagnosis not present

## 2022-07-15 DIAGNOSIS — M6259 Muscle wasting and atrophy, not elsewhere classified, multiple sites: Secondary | ICD-10-CM | POA: Diagnosis not present

## 2022-07-15 DIAGNOSIS — F419 Anxiety disorder, unspecified: Secondary | ICD-10-CM | POA: Diagnosis not present

## 2022-07-15 DIAGNOSIS — Z741 Need for assistance with personal care: Secondary | ICD-10-CM | POA: Diagnosis not present

## 2022-07-15 DIAGNOSIS — R300 Dysuria: Secondary | ICD-10-CM | POA: Diagnosis not present

## 2022-07-16 DIAGNOSIS — E871 Hypo-osmolality and hyponatremia: Secondary | ICD-10-CM | POA: Diagnosis not present

## 2022-07-17 DIAGNOSIS — E039 Hypothyroidism, unspecified: Secondary | ICD-10-CM | POA: Diagnosis not present

## 2022-07-17 DIAGNOSIS — J45998 Other asthma: Secondary | ICD-10-CM | POA: Diagnosis not present

## 2022-07-17 DIAGNOSIS — E876 Hypokalemia: Secondary | ICD-10-CM | POA: Diagnosis not present

## 2022-07-17 DIAGNOSIS — F32A Depression, unspecified: Secondary | ICD-10-CM | POA: Diagnosis not present

## 2022-07-17 DIAGNOSIS — M6281 Muscle weakness (generalized): Secondary | ICD-10-CM | POA: Diagnosis not present

## 2022-07-17 DIAGNOSIS — Z741 Need for assistance with personal care: Secondary | ICD-10-CM | POA: Diagnosis not present

## 2022-07-17 DIAGNOSIS — M6259 Muscle wasting and atrophy, not elsewhere classified, multiple sites: Secondary | ICD-10-CM | POA: Diagnosis not present

## 2022-07-17 DIAGNOSIS — E785 Hyperlipidemia, unspecified: Secondary | ICD-10-CM | POA: Diagnosis not present

## 2022-07-17 DIAGNOSIS — I10 Essential (primary) hypertension: Secondary | ICD-10-CM | POA: Diagnosis not present

## 2022-07-17 DIAGNOSIS — K5792 Diverticulitis of intestine, part unspecified, without perforation or abscess without bleeding: Secondary | ICD-10-CM | POA: Diagnosis not present

## 2022-07-17 DIAGNOSIS — R2681 Unsteadiness on feet: Secondary | ICD-10-CM | POA: Diagnosis not present

## 2022-07-17 DIAGNOSIS — E569 Vitamin deficiency, unspecified: Secondary | ICD-10-CM | POA: Diagnosis not present

## 2022-07-17 DIAGNOSIS — F419 Anxiety disorder, unspecified: Secondary | ICD-10-CM | POA: Diagnosis not present

## 2022-07-17 DIAGNOSIS — K219 Gastro-esophageal reflux disease without esophagitis: Secondary | ICD-10-CM | POA: Diagnosis not present

## 2022-07-17 DIAGNOSIS — D649 Anemia, unspecified: Secondary | ICD-10-CM | POA: Diagnosis not present

## 2022-07-20 DIAGNOSIS — N390 Urinary tract infection, site not specified: Secondary | ICD-10-CM | POA: Diagnosis not present

## 2022-07-20 DIAGNOSIS — K5900 Constipation, unspecified: Secondary | ICD-10-CM | POA: Diagnosis not present

## 2022-07-22 DIAGNOSIS — R2681 Unsteadiness on feet: Secondary | ICD-10-CM | POA: Diagnosis not present

## 2022-07-22 DIAGNOSIS — K5792 Diverticulitis of intestine, part unspecified, without perforation or abscess without bleeding: Secondary | ICD-10-CM | POA: Diagnosis not present

## 2022-07-22 DIAGNOSIS — Z741 Need for assistance with personal care: Secondary | ICD-10-CM | POA: Diagnosis not present

## 2022-07-22 DIAGNOSIS — I10 Essential (primary) hypertension: Secondary | ICD-10-CM | POA: Diagnosis not present

## 2022-07-22 DIAGNOSIS — M6259 Muscle wasting and atrophy, not elsewhere classified, multiple sites: Secondary | ICD-10-CM | POA: Diagnosis not present

## 2022-07-22 DIAGNOSIS — F419 Anxiety disorder, unspecified: Secondary | ICD-10-CM | POA: Diagnosis not present

## 2022-07-22 DIAGNOSIS — E039 Hypothyroidism, unspecified: Secondary | ICD-10-CM | POA: Diagnosis not present

## 2022-07-22 DIAGNOSIS — E876 Hypokalemia: Secondary | ICD-10-CM | POA: Diagnosis not present

## 2022-07-22 DIAGNOSIS — F32A Depression, unspecified: Secondary | ICD-10-CM | POA: Diagnosis not present

## 2022-07-22 DIAGNOSIS — E569 Vitamin deficiency, unspecified: Secondary | ICD-10-CM | POA: Diagnosis not present

## 2022-07-22 DIAGNOSIS — J45998 Other asthma: Secondary | ICD-10-CM | POA: Diagnosis not present

## 2022-07-22 DIAGNOSIS — E785 Hyperlipidemia, unspecified: Secondary | ICD-10-CM | POA: Diagnosis not present

## 2022-07-24 DIAGNOSIS — E876 Hypokalemia: Secondary | ICD-10-CM | POA: Diagnosis not present

## 2022-07-24 DIAGNOSIS — Z741 Need for assistance with personal care: Secondary | ICD-10-CM | POA: Diagnosis not present

## 2022-07-24 DIAGNOSIS — M6259 Muscle wasting and atrophy, not elsewhere classified, multiple sites: Secondary | ICD-10-CM | POA: Diagnosis not present

## 2022-07-24 DIAGNOSIS — K5713 Diverticulitis of small intestine without perforation or abscess with bleeding: Secondary | ICD-10-CM | POA: Diagnosis not present

## 2022-07-27 DIAGNOSIS — M6259 Muscle wasting and atrophy, not elsewhere classified, multiple sites: Secondary | ICD-10-CM | POA: Diagnosis not present

## 2022-07-27 DIAGNOSIS — E876 Hypokalemia: Secondary | ICD-10-CM | POA: Diagnosis not present

## 2022-07-27 DIAGNOSIS — K5713 Diverticulitis of small intestine without perforation or abscess with bleeding: Secondary | ICD-10-CM | POA: Diagnosis not present

## 2022-07-27 DIAGNOSIS — Z741 Need for assistance with personal care: Secondary | ICD-10-CM | POA: Diagnosis not present

## 2022-07-29 DIAGNOSIS — E876 Hypokalemia: Secondary | ICD-10-CM | POA: Diagnosis not present

## 2022-07-29 DIAGNOSIS — K5713 Diverticulitis of small intestine without perforation or abscess with bleeding: Secondary | ICD-10-CM | POA: Diagnosis not present

## 2022-07-29 DIAGNOSIS — Z741 Need for assistance with personal care: Secondary | ICD-10-CM | POA: Diagnosis not present

## 2022-07-29 DIAGNOSIS — M6259 Muscle wasting and atrophy, not elsewhere classified, multiple sites: Secondary | ICD-10-CM | POA: Diagnosis not present

## 2022-08-03 DIAGNOSIS — M6259 Muscle wasting and atrophy, not elsewhere classified, multiple sites: Secondary | ICD-10-CM | POA: Diagnosis not present

## 2022-08-03 DIAGNOSIS — K5713 Diverticulitis of small intestine without perforation or abscess with bleeding: Secondary | ICD-10-CM | POA: Diagnosis not present

## 2022-08-03 DIAGNOSIS — Z741 Need for assistance with personal care: Secondary | ICD-10-CM | POA: Diagnosis not present

## 2022-08-03 DIAGNOSIS — E876 Hypokalemia: Secondary | ICD-10-CM | POA: Diagnosis not present

## 2022-08-04 DIAGNOSIS — K5713 Diverticulitis of small intestine without perforation or abscess with bleeding: Secondary | ICD-10-CM | POA: Diagnosis not present

## 2022-08-04 DIAGNOSIS — Z741 Need for assistance with personal care: Secondary | ICD-10-CM | POA: Diagnosis not present

## 2022-08-04 DIAGNOSIS — M6259 Muscle wasting and atrophy, not elsewhere classified, multiple sites: Secondary | ICD-10-CM | POA: Diagnosis not present

## 2022-08-04 DIAGNOSIS — E876 Hypokalemia: Secondary | ICD-10-CM | POA: Diagnosis not present

## 2022-08-05 DIAGNOSIS — Z741 Need for assistance with personal care: Secondary | ICD-10-CM | POA: Diagnosis not present

## 2022-08-05 DIAGNOSIS — M6259 Muscle wasting and atrophy, not elsewhere classified, multiple sites: Secondary | ICD-10-CM | POA: Diagnosis not present

## 2022-08-05 DIAGNOSIS — K5713 Diverticulitis of small intestine without perforation or abscess with bleeding: Secondary | ICD-10-CM | POA: Diagnosis not present

## 2022-08-05 DIAGNOSIS — E876 Hypokalemia: Secondary | ICD-10-CM | POA: Diagnosis not present

## 2022-08-06 DIAGNOSIS — M6259 Muscle wasting and atrophy, not elsewhere classified, multiple sites: Secondary | ICD-10-CM | POA: Diagnosis not present

## 2022-08-06 DIAGNOSIS — E876 Hypokalemia: Secondary | ICD-10-CM | POA: Diagnosis not present

## 2022-08-06 DIAGNOSIS — Z741 Need for assistance with personal care: Secondary | ICD-10-CM | POA: Diagnosis not present

## 2022-08-06 DIAGNOSIS — K5713 Diverticulitis of small intestine without perforation or abscess with bleeding: Secondary | ICD-10-CM | POA: Diagnosis not present

## 2022-08-07 DIAGNOSIS — M6259 Muscle wasting and atrophy, not elsewhere classified, multiple sites: Secondary | ICD-10-CM | POA: Diagnosis not present

## 2022-08-07 DIAGNOSIS — E876 Hypokalemia: Secondary | ICD-10-CM | POA: Diagnosis not present

## 2022-08-07 DIAGNOSIS — K5713 Diverticulitis of small intestine without perforation or abscess with bleeding: Secondary | ICD-10-CM | POA: Diagnosis not present

## 2022-08-07 DIAGNOSIS — Z741 Need for assistance with personal care: Secondary | ICD-10-CM | POA: Diagnosis not present

## 2022-08-12 DIAGNOSIS — Z741 Need for assistance with personal care: Secondary | ICD-10-CM | POA: Diagnosis not present

## 2022-08-12 DIAGNOSIS — M6259 Muscle wasting and atrophy, not elsewhere classified, multiple sites: Secondary | ICD-10-CM | POA: Diagnosis not present

## 2022-08-12 DIAGNOSIS — E876 Hypokalemia: Secondary | ICD-10-CM | POA: Diagnosis not present

## 2022-08-12 DIAGNOSIS — K5713 Diverticulitis of small intestine without perforation or abscess with bleeding: Secondary | ICD-10-CM | POA: Diagnosis not present

## 2022-08-13 DIAGNOSIS — R634 Abnormal weight loss: Secondary | ICD-10-CM | POA: Diagnosis not present

## 2022-08-14 DIAGNOSIS — D649 Anemia, unspecified: Secondary | ICD-10-CM | POA: Diagnosis not present

## 2022-08-14 DIAGNOSIS — M6259 Muscle wasting and atrophy, not elsewhere classified, multiple sites: Secondary | ICD-10-CM | POA: Diagnosis not present

## 2022-08-14 DIAGNOSIS — I10 Essential (primary) hypertension: Secondary | ICD-10-CM | POA: Diagnosis not present

## 2022-08-14 DIAGNOSIS — Z741 Need for assistance with personal care: Secondary | ICD-10-CM | POA: Diagnosis not present

## 2022-08-14 DIAGNOSIS — E876 Hypokalemia: Secondary | ICD-10-CM | POA: Diagnosis not present

## 2022-08-14 DIAGNOSIS — M6281 Muscle weakness (generalized): Secondary | ICD-10-CM | POA: Diagnosis not present

## 2022-08-14 DIAGNOSIS — K219 Gastro-esophageal reflux disease without esophagitis: Secondary | ICD-10-CM | POA: Diagnosis not present

## 2022-08-14 DIAGNOSIS — K5713 Diverticulitis of small intestine without perforation or abscess with bleeding: Secondary | ICD-10-CM | POA: Diagnosis not present

## 2022-08-17 DIAGNOSIS — Z741 Need for assistance with personal care: Secondary | ICD-10-CM | POA: Diagnosis not present

## 2022-08-17 DIAGNOSIS — E46 Unspecified protein-calorie malnutrition: Secondary | ICD-10-CM | POA: Diagnosis not present

## 2022-08-17 DIAGNOSIS — N182 Chronic kidney disease, stage 2 (mild): Secondary | ICD-10-CM | POA: Diagnosis not present

## 2022-08-17 DIAGNOSIS — K5713 Diverticulitis of small intestine without perforation or abscess with bleeding: Secondary | ICD-10-CM | POA: Diagnosis not present

## 2022-08-17 DIAGNOSIS — E039 Hypothyroidism, unspecified: Secondary | ICD-10-CM | POA: Diagnosis not present

## 2022-08-17 DIAGNOSIS — E876 Hypokalemia: Secondary | ICD-10-CM | POA: Diagnosis not present

## 2022-08-17 DIAGNOSIS — M6259 Muscle wasting and atrophy, not elsewhere classified, multiple sites: Secondary | ICD-10-CM | POA: Diagnosis not present

## 2022-08-17 DIAGNOSIS — D649 Anemia, unspecified: Secondary | ICD-10-CM | POA: Diagnosis not present

## 2022-08-19 DIAGNOSIS — E876 Hypokalemia: Secondary | ICD-10-CM | POA: Diagnosis not present

## 2022-08-19 DIAGNOSIS — M6259 Muscle wasting and atrophy, not elsewhere classified, multiple sites: Secondary | ICD-10-CM | POA: Diagnosis not present

## 2022-08-19 DIAGNOSIS — Z741 Need for assistance with personal care: Secondary | ICD-10-CM | POA: Diagnosis not present

## 2022-08-19 DIAGNOSIS — K5713 Diverticulitis of small intestine without perforation or abscess with bleeding: Secondary | ICD-10-CM | POA: Diagnosis not present

## 2022-08-24 DIAGNOSIS — M6259 Muscle wasting and atrophy, not elsewhere classified, multiple sites: Secondary | ICD-10-CM | POA: Diagnosis not present

## 2022-08-24 DIAGNOSIS — I10 Essential (primary) hypertension: Secondary | ICD-10-CM | POA: Diagnosis not present

## 2022-08-24 DIAGNOSIS — Z741 Need for assistance with personal care: Secondary | ICD-10-CM | POA: Diagnosis not present

## 2022-08-24 DIAGNOSIS — E039 Hypothyroidism, unspecified: Secondary | ICD-10-CM | POA: Diagnosis not present

## 2022-08-24 DIAGNOSIS — J45998 Other asthma: Secondary | ICD-10-CM | POA: Diagnosis not present

## 2022-08-24 DIAGNOSIS — F419 Anxiety disorder, unspecified: Secondary | ICD-10-CM | POA: Diagnosis not present

## 2022-08-24 DIAGNOSIS — E785 Hyperlipidemia, unspecified: Secondary | ICD-10-CM | POA: Diagnosis not present

## 2022-08-24 DIAGNOSIS — R2681 Unsteadiness on feet: Secondary | ICD-10-CM | POA: Diagnosis not present

## 2022-08-24 DIAGNOSIS — F32A Depression, unspecified: Secondary | ICD-10-CM | POA: Diagnosis not present

## 2022-08-24 DIAGNOSIS — E569 Vitamin deficiency, unspecified: Secondary | ICD-10-CM | POA: Diagnosis not present

## 2022-08-25 DIAGNOSIS — I10 Essential (primary) hypertension: Secondary | ICD-10-CM | POA: Diagnosis not present

## 2022-08-25 DIAGNOSIS — M6281 Muscle weakness (generalized): Secondary | ICD-10-CM | POA: Diagnosis not present

## 2022-08-25 DIAGNOSIS — K219 Gastro-esophageal reflux disease without esophagitis: Secondary | ICD-10-CM | POA: Diagnosis not present

## 2022-08-25 DIAGNOSIS — D649 Anemia, unspecified: Secondary | ICD-10-CM | POA: Diagnosis not present

## 2022-09-03 DIAGNOSIS — R601 Generalized edema: Secondary | ICD-10-CM | POA: Diagnosis not present

## 2022-09-03 DIAGNOSIS — H409 Unspecified glaucoma: Secondary | ICD-10-CM | POA: Diagnosis not present

## 2022-09-03 DIAGNOSIS — F411 Generalized anxiety disorder: Secondary | ICD-10-CM | POA: Diagnosis not present

## 2022-09-03 DIAGNOSIS — I10 Essential (primary) hypertension: Secondary | ICD-10-CM | POA: Diagnosis not present

## 2022-09-03 DIAGNOSIS — R269 Unspecified abnormalities of gait and mobility: Secondary | ICD-10-CM | POA: Diagnosis not present

## 2022-09-03 DIAGNOSIS — B372 Candidiasis of skin and nail: Secondary | ICD-10-CM | POA: Diagnosis not present

## 2022-09-03 DIAGNOSIS — F4322 Adjustment disorder with anxiety: Secondary | ICD-10-CM | POA: Diagnosis not present

## 2022-09-03 DIAGNOSIS — E039 Hypothyroidism, unspecified: Secondary | ICD-10-CM | POA: Diagnosis not present

## 2022-09-11 DIAGNOSIS — E559 Vitamin D deficiency, unspecified: Secondary | ICD-10-CM | POA: Diagnosis not present

## 2022-09-11 DIAGNOSIS — Z79899 Other long term (current) drug therapy: Secondary | ICD-10-CM | POA: Diagnosis not present

## 2022-09-11 DIAGNOSIS — E038 Other specified hypothyroidism: Secondary | ICD-10-CM | POA: Diagnosis not present

## 2022-09-11 DIAGNOSIS — J449 Chronic obstructive pulmonary disease, unspecified: Secondary | ICD-10-CM | POA: Diagnosis not present

## 2022-09-17 DIAGNOSIS — R601 Generalized edema: Secondary | ICD-10-CM | POA: Diagnosis not present

## 2022-09-17 DIAGNOSIS — B372 Candidiasis of skin and nail: Secondary | ICD-10-CM | POA: Diagnosis not present

## 2022-09-17 DIAGNOSIS — E039 Hypothyroidism, unspecified: Secondary | ICD-10-CM | POA: Diagnosis not present

## 2022-09-17 DIAGNOSIS — R600 Localized edema: Secondary | ICD-10-CM | POA: Diagnosis not present

## 2022-09-17 DIAGNOSIS — F411 Generalized anxiety disorder: Secondary | ICD-10-CM | POA: Diagnosis not present

## 2022-09-17 DIAGNOSIS — D509 Iron deficiency anemia, unspecified: Secondary | ICD-10-CM | POA: Diagnosis not present

## 2022-09-17 DIAGNOSIS — I129 Hypertensive chronic kidney disease with stage 1 through stage 4 chronic kidney disease, or unspecified chronic kidney disease: Secondary | ICD-10-CM | POA: Diagnosis not present

## 2022-09-17 DIAGNOSIS — N1831 Chronic kidney disease, stage 3a: Secondary | ICD-10-CM | POA: Diagnosis not present

## 2022-09-17 DIAGNOSIS — R21 Rash and other nonspecific skin eruption: Secondary | ICD-10-CM | POA: Diagnosis not present

## 2022-09-18 DIAGNOSIS — E038 Other specified hypothyroidism: Secondary | ICD-10-CM | POA: Diagnosis not present

## 2022-09-18 DIAGNOSIS — D509 Iron deficiency anemia, unspecified: Secondary | ICD-10-CM | POA: Diagnosis not present

## 2022-09-18 DIAGNOSIS — I10 Essential (primary) hypertension: Secondary | ICD-10-CM | POA: Diagnosis not present

## 2022-09-18 DIAGNOSIS — R269 Unspecified abnormalities of gait and mobility: Secondary | ICD-10-CM | POA: Diagnosis not present

## 2022-09-18 DIAGNOSIS — I129 Hypertensive chronic kidney disease with stage 1 through stage 4 chronic kidney disease, or unspecified chronic kidney disease: Secondary | ICD-10-CM | POA: Diagnosis not present

## 2022-09-18 DIAGNOSIS — K5904 Chronic idiopathic constipation: Secondary | ICD-10-CM | POA: Diagnosis not present

## 2022-09-18 DIAGNOSIS — R1311 Dysphagia, oral phase: Secondary | ICD-10-CM | POA: Diagnosis not present

## 2022-09-23 DIAGNOSIS — R601 Generalized edema: Secondary | ICD-10-CM | POA: Diagnosis not present

## 2022-09-23 DIAGNOSIS — N1831 Chronic kidney disease, stage 3a: Secondary | ICD-10-CM | POA: Diagnosis not present

## 2022-09-30 DIAGNOSIS — I129 Hypertensive chronic kidney disease with stage 1 through stage 4 chronic kidney disease, or unspecified chronic kidney disease: Secondary | ICD-10-CM | POA: Diagnosis not present

## 2022-09-30 DIAGNOSIS — N1831 Chronic kidney disease, stage 3a: Secondary | ICD-10-CM | POA: Diagnosis not present

## 2022-09-30 DIAGNOSIS — R600 Localized edema: Secondary | ICD-10-CM | POA: Diagnosis not present

## 2022-09-30 DIAGNOSIS — R269 Unspecified abnormalities of gait and mobility: Secondary | ICD-10-CM | POA: Diagnosis not present

## 2022-09-30 DIAGNOSIS — J449 Chronic obstructive pulmonary disease, unspecified: Secondary | ICD-10-CM | POA: Diagnosis not present

## 2022-10-09 DIAGNOSIS — F331 Major depressive disorder, recurrent, moderate: Secondary | ICD-10-CM | POA: Diagnosis not present

## 2022-10-09 DIAGNOSIS — J449 Chronic obstructive pulmonary disease, unspecified: Secondary | ICD-10-CM | POA: Diagnosis not present

## 2022-10-13 DIAGNOSIS — E039 Hypothyroidism, unspecified: Secondary | ICD-10-CM | POA: Diagnosis not present

## 2022-10-13 DIAGNOSIS — E611 Iron deficiency: Secondary | ICD-10-CM | POA: Diagnosis not present

## 2022-10-13 DIAGNOSIS — E1165 Type 2 diabetes mellitus with hyperglycemia: Secondary | ICD-10-CM | POA: Diagnosis not present

## 2022-10-13 DIAGNOSIS — K5792 Diverticulitis of intestine, part unspecified, without perforation or abscess without bleeding: Secondary | ICD-10-CM | POA: Diagnosis not present

## 2022-10-13 DIAGNOSIS — I10 Essential (primary) hypertension: Secondary | ICD-10-CM | POA: Diagnosis not present

## 2022-10-13 DIAGNOSIS — N182 Chronic kidney disease, stage 2 (mild): Secondary | ICD-10-CM | POA: Diagnosis not present

## 2022-10-13 DIAGNOSIS — I6522 Occlusion and stenosis of left carotid artery: Secondary | ICD-10-CM | POA: Diagnosis not present

## 2022-10-13 DIAGNOSIS — I129 Hypertensive chronic kidney disease with stage 1 through stage 4 chronic kidney disease, or unspecified chronic kidney disease: Secondary | ICD-10-CM | POA: Diagnosis not present

## 2022-10-19 DIAGNOSIS — G3184 Mild cognitive impairment, so stated: Secondary | ICD-10-CM | POA: Diagnosis not present

## 2022-10-19 DIAGNOSIS — F331 Major depressive disorder, recurrent, moderate: Secondary | ICD-10-CM | POA: Diagnosis not present

## 2022-10-19 DIAGNOSIS — F4322 Adjustment disorder with anxiety: Secondary | ICD-10-CM | POA: Diagnosis not present

## 2022-10-23 DIAGNOSIS — I82409 Acute embolism and thrombosis of unspecified deep veins of unspecified lower extremity: Secondary | ICD-10-CM | POA: Diagnosis not present

## 2022-10-23 DIAGNOSIS — I8000 Phlebitis and thrombophlebitis of superficial vessels of unspecified lower extremity: Secondary | ICD-10-CM | POA: Diagnosis not present

## 2022-10-26 DIAGNOSIS — I4891 Unspecified atrial fibrillation: Secondary | ICD-10-CM | POA: Diagnosis not present

## 2022-10-26 DIAGNOSIS — J439 Emphysema, unspecified: Secondary | ICD-10-CM | POA: Diagnosis not present

## 2022-10-26 DIAGNOSIS — E669 Obesity, unspecified: Secondary | ICD-10-CM | POA: Diagnosis not present

## 2022-10-26 DIAGNOSIS — F411 Generalized anxiety disorder: Secondary | ICD-10-CM | POA: Diagnosis not present

## 2022-10-26 DIAGNOSIS — I110 Hypertensive heart disease with heart failure: Secondary | ICD-10-CM | POA: Diagnosis not present

## 2022-10-26 DIAGNOSIS — E039 Hypothyroidism, unspecified: Secondary | ICD-10-CM | POA: Diagnosis not present

## 2022-10-26 DIAGNOSIS — E119 Type 2 diabetes mellitus without complications: Secondary | ICD-10-CM | POA: Diagnosis not present

## 2022-10-26 DIAGNOSIS — F3341 Major depressive disorder, recurrent, in partial remission: Secondary | ICD-10-CM | POA: Diagnosis not present

## 2022-10-26 DIAGNOSIS — I509 Heart failure, unspecified: Secondary | ICD-10-CM | POA: Diagnosis not present

## 2022-10-26 DIAGNOSIS — M1990 Unspecified osteoarthritis, unspecified site: Secondary | ICD-10-CM | POA: Diagnosis not present

## 2022-10-26 DIAGNOSIS — K219 Gastro-esophageal reflux disease without esophagitis: Secondary | ICD-10-CM | POA: Diagnosis not present

## 2022-10-26 DIAGNOSIS — D6869 Other thrombophilia: Secondary | ICD-10-CM | POA: Diagnosis not present

## 2022-11-09 DIAGNOSIS — I7091 Generalized atherosclerosis: Secondary | ICD-10-CM | POA: Diagnosis not present

## 2022-11-09 DIAGNOSIS — B351 Tinea unguium: Secondary | ICD-10-CM | POA: Diagnosis not present

## 2022-11-12 DIAGNOSIS — F411 Generalized anxiety disorder: Secondary | ICD-10-CM | POA: Diagnosis not present

## 2022-11-12 DIAGNOSIS — F331 Major depressive disorder, recurrent, moderate: Secondary | ICD-10-CM | POA: Diagnosis not present

## 2022-11-19 DIAGNOSIS — E039 Hypothyroidism, unspecified: Secondary | ICD-10-CM | POA: Diagnosis not present

## 2022-11-19 DIAGNOSIS — E7800 Pure hypercholesterolemia, unspecified: Secondary | ICD-10-CM | POA: Diagnosis not present

## 2022-11-19 DIAGNOSIS — I10 Essential (primary) hypertension: Secondary | ICD-10-CM | POA: Diagnosis not present

## 2022-11-19 DIAGNOSIS — I82402 Acute embolism and thrombosis of unspecified deep veins of left lower extremity: Secondary | ICD-10-CM | POA: Diagnosis not present

## 2022-11-19 DIAGNOSIS — M8008XS Age-related osteoporosis with current pathological fracture, vertebra(e), sequela: Secondary | ICD-10-CM | POA: Diagnosis not present

## 2022-11-19 DIAGNOSIS — R600 Localized edema: Secondary | ICD-10-CM | POA: Diagnosis not present

## 2022-12-02 DIAGNOSIS — F4322 Adjustment disorder with anxiety: Secondary | ICD-10-CM | POA: Diagnosis not present

## 2022-12-02 DIAGNOSIS — F411 Generalized anxiety disorder: Secondary | ICD-10-CM | POA: Diagnosis not present

## 2022-12-02 DIAGNOSIS — G3184 Mild cognitive impairment, so stated: Secondary | ICD-10-CM | POA: Diagnosis not present

## 2022-12-02 DIAGNOSIS — F331 Major depressive disorder, recurrent, moderate: Secondary | ICD-10-CM | POA: Diagnosis not present

## 2022-12-10 DIAGNOSIS — F411 Generalized anxiety disorder: Secondary | ICD-10-CM | POA: Diagnosis not present

## 2022-12-10 DIAGNOSIS — F331 Major depressive disorder, recurrent, moderate: Secondary | ICD-10-CM | POA: Diagnosis not present

## 2022-12-17 DIAGNOSIS — F331 Major depressive disorder, recurrent, moderate: Secondary | ICD-10-CM | POA: Diagnosis not present

## 2022-12-17 DIAGNOSIS — J449 Chronic obstructive pulmonary disease, unspecified: Secondary | ICD-10-CM | POA: Diagnosis not present

## 2022-12-24 DIAGNOSIS — F331 Major depressive disorder, recurrent, moderate: Secondary | ICD-10-CM | POA: Diagnosis not present

## 2022-12-24 DIAGNOSIS — F411 Generalized anxiety disorder: Secondary | ICD-10-CM | POA: Diagnosis not present

## 2022-12-29 DIAGNOSIS — E1165 Type 2 diabetes mellitus with hyperglycemia: Secondary | ICD-10-CM | POA: Diagnosis not present

## 2022-12-29 DIAGNOSIS — E039 Hypothyroidism, unspecified: Secondary | ICD-10-CM | POA: Diagnosis not present

## 2022-12-29 DIAGNOSIS — E7800 Pure hypercholesterolemia, unspecified: Secondary | ICD-10-CM | POA: Diagnosis not present

## 2022-12-29 DIAGNOSIS — N8110 Cystocele, unspecified: Secondary | ICD-10-CM | POA: Diagnosis not present

## 2022-12-29 DIAGNOSIS — E538 Deficiency of other specified B group vitamins: Secondary | ICD-10-CM | POA: Diagnosis not present

## 2022-12-29 DIAGNOSIS — I6522 Occlusion and stenosis of left carotid artery: Secondary | ICD-10-CM | POA: Diagnosis not present

## 2022-12-29 DIAGNOSIS — I10 Essential (primary) hypertension: Secondary | ICD-10-CM | POA: Diagnosis not present

## 2023-01-04 DIAGNOSIS — F331 Major depressive disorder, recurrent, moderate: Secondary | ICD-10-CM | POA: Diagnosis not present

## 2023-01-04 DIAGNOSIS — G3184 Mild cognitive impairment, so stated: Secondary | ICD-10-CM | POA: Diagnosis not present

## 2023-01-04 DIAGNOSIS — F4322 Adjustment disorder with anxiety: Secondary | ICD-10-CM | POA: Diagnosis not present

## 2023-01-04 DIAGNOSIS — F411 Generalized anxiety disorder: Secondary | ICD-10-CM | POA: Diagnosis not present

## 2023-01-07 DIAGNOSIS — F411 Generalized anxiety disorder: Secondary | ICD-10-CM | POA: Diagnosis not present

## 2023-01-07 DIAGNOSIS — F331 Major depressive disorder, recurrent, moderate: Secondary | ICD-10-CM | POA: Diagnosis not present

## 2023-01-27 DIAGNOSIS — D2261 Melanocytic nevi of right upper limb, including shoulder: Secondary | ICD-10-CM | POA: Diagnosis not present

## 2023-01-27 DIAGNOSIS — L821 Other seborrheic keratosis: Secondary | ICD-10-CM | POA: Diagnosis not present

## 2023-01-27 DIAGNOSIS — L820 Inflamed seborrheic keratosis: Secondary | ICD-10-CM | POA: Diagnosis not present

## 2023-01-27 DIAGNOSIS — D485 Neoplasm of uncertain behavior of skin: Secondary | ICD-10-CM | POA: Diagnosis not present

## 2023-01-27 DIAGNOSIS — L578 Other skin changes due to chronic exposure to nonionizing radiation: Secondary | ICD-10-CM | POA: Diagnosis not present

## 2023-01-27 DIAGNOSIS — B351 Tinea unguium: Secondary | ICD-10-CM | POA: Diagnosis not present

## 2023-01-27 DIAGNOSIS — D2272 Melanocytic nevi of left lower limb, including hip: Secondary | ICD-10-CM | POA: Diagnosis not present

## 2023-01-27 DIAGNOSIS — L258 Unspecified contact dermatitis due to other agents: Secondary | ICD-10-CM | POA: Diagnosis not present

## 2023-01-27 DIAGNOSIS — L538 Other specified erythematous conditions: Secondary | ICD-10-CM | POA: Diagnosis not present

## 2023-01-27 DIAGNOSIS — L298 Other pruritus: Secondary | ICD-10-CM | POA: Diagnosis not present

## 2023-01-27 DIAGNOSIS — D2262 Melanocytic nevi of left upper limb, including shoulder: Secondary | ICD-10-CM | POA: Diagnosis not present

## 2023-02-02 DIAGNOSIS — I82402 Acute embolism and thrombosis of unspecified deep veins of left lower extremity: Secondary | ICD-10-CM | POA: Diagnosis not present

## 2023-02-02 DIAGNOSIS — Z1211 Encounter for screening for malignant neoplasm of colon: Secondary | ICD-10-CM | POA: Diagnosis not present

## 2023-02-02 DIAGNOSIS — E039 Hypothyroidism, unspecified: Secondary | ICD-10-CM | POA: Diagnosis not present

## 2023-02-02 DIAGNOSIS — Z1331 Encounter for screening for depression: Secondary | ICD-10-CM | POA: Diagnosis not present

## 2023-02-02 DIAGNOSIS — E7800 Pure hypercholesterolemia, unspecified: Secondary | ICD-10-CM | POA: Diagnosis not present

## 2023-02-02 DIAGNOSIS — M810 Age-related osteoporosis without current pathological fracture: Secondary | ICD-10-CM | POA: Diagnosis not present

## 2023-02-02 DIAGNOSIS — Z136 Encounter for screening for cardiovascular disorders: Secondary | ICD-10-CM | POA: Diagnosis not present

## 2023-02-02 DIAGNOSIS — E1165 Type 2 diabetes mellitus with hyperglycemia: Secondary | ICD-10-CM | POA: Diagnosis not present

## 2023-02-02 DIAGNOSIS — Z0001 Encounter for general adult medical examination with abnormal findings: Secondary | ICD-10-CM | POA: Diagnosis not present

## 2023-02-02 DIAGNOSIS — I6522 Occlusion and stenosis of left carotid artery: Secondary | ICD-10-CM | POA: Diagnosis not present

## 2023-02-09 DIAGNOSIS — U071 COVID-19: Secondary | ICD-10-CM | POA: Diagnosis not present

## 2023-02-09 DIAGNOSIS — J208 Acute bronchitis due to other specified organisms: Secondary | ICD-10-CM | POA: Diagnosis not present

## 2023-02-09 DIAGNOSIS — R5383 Other fatigue: Secondary | ICD-10-CM | POA: Diagnosis not present

## 2023-02-19 DIAGNOSIS — E1165 Type 2 diabetes mellitus with hyperglycemia: Secondary | ICD-10-CM | POA: Diagnosis not present

## 2023-02-19 DIAGNOSIS — I82402 Acute embolism and thrombosis of unspecified deep veins of left lower extremity: Secondary | ICD-10-CM | POA: Diagnosis not present

## 2023-02-19 DIAGNOSIS — E7800 Pure hypercholesterolemia, unspecified: Secondary | ICD-10-CM | POA: Diagnosis not present

## 2023-02-19 DIAGNOSIS — I10 Essential (primary) hypertension: Secondary | ICD-10-CM | POA: Diagnosis not present

## 2023-02-19 DIAGNOSIS — N3090 Cystitis, unspecified without hematuria: Secondary | ICD-10-CM | POA: Diagnosis not present

## 2023-02-19 DIAGNOSIS — N393 Stress incontinence (female) (male): Secondary | ICD-10-CM | POA: Diagnosis not present

## 2023-02-19 DIAGNOSIS — E039 Hypothyroidism, unspecified: Secondary | ICD-10-CM | POA: Diagnosis not present

## 2023-02-19 DIAGNOSIS — K219 Gastro-esophageal reflux disease without esophagitis: Secondary | ICD-10-CM | POA: Diagnosis not present

## 2023-02-19 DIAGNOSIS — M810 Age-related osteoporosis without current pathological fracture: Secondary | ICD-10-CM | POA: Diagnosis not present

## 2023-02-19 DIAGNOSIS — I6522 Occlusion and stenosis of left carotid artery: Secondary | ICD-10-CM | POA: Diagnosis not present

## 2023-03-04 DIAGNOSIS — M810 Age-related osteoporosis without current pathological fracture: Secondary | ICD-10-CM | POA: Diagnosis not present

## 2023-03-04 DIAGNOSIS — E7800 Pure hypercholesterolemia, unspecified: Secondary | ICD-10-CM | POA: Diagnosis not present

## 2023-03-04 DIAGNOSIS — R600 Localized edema: Secondary | ICD-10-CM | POA: Diagnosis not present

## 2023-03-04 DIAGNOSIS — I82402 Acute embolism and thrombosis of unspecified deep veins of left lower extremity: Secondary | ICD-10-CM | POA: Diagnosis not present

## 2023-03-04 DIAGNOSIS — R5383 Other fatigue: Secondary | ICD-10-CM | POA: Diagnosis not present

## 2023-03-04 DIAGNOSIS — E039 Hypothyroidism, unspecified: Secondary | ICD-10-CM | POA: Diagnosis not present

## 2023-03-04 DIAGNOSIS — T63481A Toxic effect of venom of other arthropod, accidental (unintentional), initial encounter: Secondary | ICD-10-CM | POA: Diagnosis not present

## 2023-03-04 DIAGNOSIS — E1165 Type 2 diabetes mellitus with hyperglycemia: Secondary | ICD-10-CM | POA: Diagnosis not present

## 2023-03-04 DIAGNOSIS — J019 Acute sinusitis, unspecified: Secondary | ICD-10-CM | POA: Diagnosis not present

## 2023-03-04 DIAGNOSIS — I6522 Occlusion and stenosis of left carotid artery: Secondary | ICD-10-CM | POA: Diagnosis not present

## 2023-03-04 DIAGNOSIS — I10 Essential (primary) hypertension: Secondary | ICD-10-CM | POA: Diagnosis not present

## 2023-03-04 DIAGNOSIS — E161 Other hypoglycemia: Secondary | ICD-10-CM | POA: Diagnosis not present

## 2023-03-04 LAB — HM DEXA SCAN

## 2023-03-05 LAB — LAB REPORT - SCANNED: EGFR: 44

## 2023-03-23 DIAGNOSIS — E669 Obesity, unspecified: Secondary | ICD-10-CM | POA: Diagnosis not present

## 2023-03-23 DIAGNOSIS — E611 Iron deficiency: Secondary | ICD-10-CM | POA: Diagnosis not present

## 2023-03-23 DIAGNOSIS — E039 Hypothyroidism, unspecified: Secondary | ICD-10-CM | POA: Diagnosis not present

## 2023-03-23 DIAGNOSIS — I6522 Occlusion and stenosis of left carotid artery: Secondary | ICD-10-CM | POA: Diagnosis not present

## 2023-03-23 DIAGNOSIS — E7800 Pure hypercholesterolemia, unspecified: Secondary | ICD-10-CM | POA: Diagnosis not present

## 2023-03-23 DIAGNOSIS — F064 Anxiety disorder due to known physiological condition: Secondary | ICD-10-CM | POA: Diagnosis not present

## 2023-03-23 DIAGNOSIS — E1165 Type 2 diabetes mellitus with hyperglycemia: Secondary | ICD-10-CM | POA: Diagnosis not present

## 2023-03-23 DIAGNOSIS — I10 Essential (primary) hypertension: Secondary | ICD-10-CM | POA: Diagnosis not present

## 2023-03-23 DIAGNOSIS — I82402 Acute embolism and thrombosis of unspecified deep veins of left lower extremity: Secondary | ICD-10-CM | POA: Diagnosis not present

## 2023-05-10 DIAGNOSIS — R1013 Epigastric pain: Secondary | ICD-10-CM | POA: Diagnosis not present

## 2023-05-10 DIAGNOSIS — R197 Diarrhea, unspecified: Secondary | ICD-10-CM | POA: Diagnosis not present

## 2023-05-10 DIAGNOSIS — E7800 Pure hypercholesterolemia, unspecified: Secondary | ICD-10-CM | POA: Diagnosis not present

## 2023-05-10 DIAGNOSIS — I10 Essential (primary) hypertension: Secondary | ICD-10-CM | POA: Diagnosis not present

## 2023-05-10 DIAGNOSIS — K5730 Diverticulosis of large intestine without perforation or abscess without bleeding: Secondary | ICD-10-CM | POA: Diagnosis not present

## 2023-05-10 DIAGNOSIS — R110 Nausea: Secondary | ICD-10-CM | POA: Diagnosis not present

## 2023-05-17 DIAGNOSIS — F4322 Adjustment disorder with anxiety: Secondary | ICD-10-CM | POA: Diagnosis not present

## 2023-05-17 DIAGNOSIS — K5730 Diverticulosis of large intestine without perforation or abscess without bleeding: Secondary | ICD-10-CM | POA: Diagnosis not present

## 2023-05-17 DIAGNOSIS — R1013 Epigastric pain: Secondary | ICD-10-CM | POA: Diagnosis not present

## 2023-05-17 DIAGNOSIS — R110 Nausea: Secondary | ICD-10-CM | POA: Diagnosis not present

## 2023-05-17 DIAGNOSIS — R197 Diarrhea, unspecified: Secondary | ICD-10-CM | POA: Diagnosis not present

## 2023-06-15 DIAGNOSIS — E559 Vitamin D deficiency, unspecified: Secondary | ICD-10-CM | POA: Diagnosis not present

## 2023-06-15 DIAGNOSIS — E611 Iron deficiency: Secondary | ICD-10-CM | POA: Diagnosis not present

## 2023-06-15 DIAGNOSIS — E1165 Type 2 diabetes mellitus with hyperglycemia: Secondary | ICD-10-CM | POA: Diagnosis not present

## 2023-06-15 DIAGNOSIS — E039 Hypothyroidism, unspecified: Secondary | ICD-10-CM | POA: Diagnosis not present

## 2023-06-15 DIAGNOSIS — E538 Deficiency of other specified B group vitamins: Secondary | ICD-10-CM | POA: Diagnosis not present

## 2023-06-15 DIAGNOSIS — E7800 Pure hypercholesterolemia, unspecified: Secondary | ICD-10-CM | POA: Diagnosis not present

## 2023-06-17 ENCOUNTER — Emergency Department
Admission: EM | Admit: 2023-06-17 | Discharge: 2023-06-17 | Disposition: A | Discharge status: Home / Self Care | Payer: Medicare HMO | Attending: Emergency Medicine | Admitting: Emergency Medicine

## 2023-06-17 DIAGNOSIS — E611 Iron deficiency: Secondary | ICD-10-CM | POA: Diagnosis not present

## 2023-06-17 DIAGNOSIS — Z79899 Other long term (current) drug therapy: Secondary | ICD-10-CM | POA: Diagnosis not present

## 2023-06-17 DIAGNOSIS — J449 Chronic obstructive pulmonary disease, unspecified: Secondary | ICD-10-CM | POA: Diagnosis not present

## 2023-06-17 DIAGNOSIS — Z0000 Encounter for general adult medical examination without abnormal findings: Secondary | ICD-10-CM | POA: Diagnosis present

## 2023-06-17 DIAGNOSIS — I10 Essential (primary) hypertension: Secondary | ICD-10-CM | POA: Diagnosis not present

## 2023-06-17 LAB — CBC
HCT: 36.9 % (ref 36.0–46.0)
Hemoglobin: 11.7 g/dL — ABNORMAL LOW (ref 12.0–15.0)
MCH: 27.6 pg (ref 26.0–34.0)
MCHC: 31.7 g/dL (ref 30.0–36.0)
MCV: 87 fL (ref 80.0–100.0)
Platelets: 358 10*3/uL (ref 150–400)
RBC: 4.24 MIL/uL (ref 3.87–5.11)
RDW: 15.3 % (ref 11.5–15.5)
WBC: 8.7 10*3/uL (ref 4.0–10.5)
nRBC: 0 % (ref 0.0–0.2)

## 2023-06-17 LAB — URINALYSIS, ROUTINE W REFLEX MICROSCOPIC
Bacteria, UA: NONE SEEN
Bilirubin Urine: NEGATIVE
Glucose, UA: NEGATIVE mg/dL
Hgb urine dipstick: NEGATIVE
Ketones, ur: NEGATIVE mg/dL
Nitrite: NEGATIVE
Protein, ur: NEGATIVE mg/dL
Specific Gravity, Urine: 1.008 (ref 1.005–1.030)
pH: 5 (ref 5.0–8.0)

## 2023-06-17 LAB — COMPREHENSIVE METABOLIC PANEL
ALT: 11 U/L (ref 0–44)
AST: 19 U/L (ref 15–41)
Albumin: 3.9 g/dL (ref 3.5–5.0)
Alkaline Phosphatase: 92 U/L (ref 38–126)
Anion gap: 10 (ref 5–15)
BUN: 15 mg/dL (ref 8–23)
CO2: 24 mmol/L (ref 22–32)
Calcium: 9.2 mg/dL (ref 8.9–10.3)
Chloride: 104 mmol/L (ref 98–111)
Creatinine, Ser: 1.12 mg/dL — ABNORMAL HIGH (ref 0.44–1.00)
GFR, Estimated: 47 mL/min — ABNORMAL LOW (ref >60)
Glucose, Bld: 140 mg/dL — ABNORMAL HIGH (ref 70–99)
Potassium: 3.7 mmol/L (ref 3.5–5.1)
Sodium: 138 mmol/L (ref 135–145)
Total Bilirubin: 0.4 mg/dL (ref 0.3–1.2)
Total Protein: 7.7 g/dL (ref 6.5–8.1)

## 2023-06-17 LAB — IRON AND TIBC
Iron: 42 ug/dL (ref 28–170)
Saturation Ratios: 14 % (ref 10.4–31.8)
TIBC: 311 ug/dL (ref 250–450)
UIBC: 269 ug/dL

## 2023-06-17 NOTE — ED Provider Notes (Signed)
[facility] Provider Note    Event Date/Time   First MD Initiated Contact with Patient 06/17/23 1752     (approximate)  History   Chief Complaint:  HPI  Pamela Reed is a 89 y.o. female with a past medical history of arthritis, COPD, gastric reflux, hypertension, presents to the emergency department for low blood counts/iron.  According to the patient she saw her PCP yesterday who checked routine lab work.  States she was called back today saying that her iron/blood levels very low and she needs to go to the emergency department.  Patient states last year she had a GI bleed and he was concerned that she could be having another GI bleed.  She does not know what the lab value was and does not have access to her outpatient labs.  I have reviewed the patient's historical labs in our system as well as from care everywhere.  The last lab work available is approximately 41-year-old.  At that time patient was anemic with a hemoglobin around 9.0.  Patient denies any dark black or bloody stool.  No vomiting.  No abdominal pain.  Otherwise states she feels well.  Physical Exam   Triage Vital Signs: ED Triage Vitals  Encounter Vitals Group     BP 06/17/23 1623 (!) 157/71     Systolic BP Percentile --      Diastolic BP Percentile --      Pulse Rate 06/17/23 1623 78     Resp 06/17/23 1623 16     Temp 06/17/23 1623 97.8 F (36.6 C)     Temp Source 06/17/23 1623 Oral     SpO2 06/17/23 1623 93 %     Weight 06/17/23 1642 148 lb (67.1 kg)     Height 06/17/23 1642 5\' 1"  (1.549 m)     Head Circumference --      Peak Flow --      Pain Score 06/17/23 1641 5     Pain Loc --      Pain Education --      Exclude from Growth Chart --     Most recent vital signs: Vitals:   06/17/23 1623  BP: (!) 157/71  Pulse: 78  Resp: 16  Temp: 97.8 F (36.6 C)  SpO2: 93%    General: Awake, no distress.  CV:  Good peripheral perfusion.  Regular rate and rhythm  Resp:  Normal  effort.  Equal breath sounds bilaterally.  Abd:  No distention.  Soft, nontender.     ED Results / Procedures / Treatments   MEDICATIONS ORDERED IN ED: Medications - No data to display   IMPRESSION / MDM / ASSESSMENT AND PLAN / ED COURSE  I reviewed the triage vital signs and the nursing notes.  Patient's presentation is most consistent with acute presentation with potential threat to life or bodily function.  Patient presents emergency department for reportedly low blood counts checked by her PCP routinely yesterday.  Patient denies any symptoms.  No black or bloody stool.  Patient does not have access to her lab work from yesterday.  Patient states they took lab work in the office and it was run at [employer].  Patient's labs today in the emergency department show a normal urinalysis with no sign of urinary tract infection, CBC is normal with a hemoglobin of 11.7.  Chemistry is reassuring.  Given the patient's reassuring lab work in the emergency department I suspect likely outpatient lab error.  Patient has  no symptoms reassuring vital signs and physical exam.  Will discharge with PCP follow-up.  I have given the patient a printout of her labs to bring to her PCP as well.  FINAL CLINICAL IMPRESSION(S) / ED DIAGNOSES   Medical evaluation  Note:  This document was prepared using Dragon voice recognition software and may include unintentional dictation errors.   Minna Antis, MD 06/17/23 [phone]

## 2023-06-17 NOTE — Discharge Instructions (Signed)
You have been seen in the emergency department for an abnormal lab value.  Your workup in the emergency department today showed reassuring labs.  Please bring your printed out labs to your primary care doctor.  Return to the emergency department for any symptom concerning to yourself.

## 2023-06-17 NOTE — ED Triage Notes (Signed)
Pt presents to the ED POV with daughter. Pt resides at [facility] in independent living. Pt was sent here by PCP. Pt is unsure what lab was abnormal, but states that she thinks it's her iron. Pt also reports some bladder pain.

## 2023-06-17 NOTE — ED Notes (Signed)
Pt A&O x4, no obvious distress noted, respirations regular/unlabored. Pt verbalizes understanding of discharge instructions. Pt able to ambulate from ED independently.   

## 2023-08-03 ENCOUNTER — Other Ambulatory Visit: Payer: Self-pay | Admitting: Gastroenterology

## 2023-08-03 DIAGNOSIS — R197 Diarrhea, unspecified: Secondary | ICD-10-CM | POA: Diagnosis not present

## 2023-08-03 DIAGNOSIS — K219 Gastro-esophageal reflux disease without esophagitis: Secondary | ICD-10-CM | POA: Diagnosis not present

## 2023-08-03 DIAGNOSIS — R634 Abnormal weight loss: Secondary | ICD-10-CM

## 2023-08-03 DIAGNOSIS — K2100 Gastro-esophageal reflux disease with esophagitis, without bleeding: Secondary | ICD-10-CM

## 2023-08-10 ENCOUNTER — Encounter: Payer: Self-pay | Admitting: Gastroenterology

## 2023-08-19 ENCOUNTER — Ambulatory Visit
Admission: RE | Admit: 2023-08-19 | Discharge: 2023-08-19 | Disposition: A | Discharge status: Home / Self Care | Payer: Medicare HMO | Source: Ambulatory Visit | Attending: Gastroenterology | Admitting: Gastroenterology

## 2023-08-19 DIAGNOSIS — R197 Diarrhea, unspecified: Secondary | ICD-10-CM | POA: Diagnosis not present

## 2023-08-19 DIAGNOSIS — K449 Diaphragmatic hernia without obstruction or gangrene: Secondary | ICD-10-CM | POA: Diagnosis not present

## 2023-08-19 DIAGNOSIS — R1013 Epigastric pain: Secondary | ICD-10-CM | POA: Diagnosis not present

## 2023-08-19 DIAGNOSIS — R634 Abnormal weight loss: Secondary | ICD-10-CM

## 2023-08-19 DIAGNOSIS — K2100 Gastro-esophageal reflux disease with esophagitis, without bleeding: Secondary | ICD-10-CM

## 2023-08-19 DIAGNOSIS — K5730 Diverticulosis of large intestine without perforation or abscess without bleeding: Secondary | ICD-10-CM | POA: Diagnosis not present

## 2023-09-16 ENCOUNTER — Encounter: Payer: Self-pay | Admitting: Internal Medicine

## 2023-09-16 ENCOUNTER — Ambulatory Visit: Payer: Medicare HMO | Admitting: Internal Medicine

## 2023-09-16 VITALS — BP 122/72 | HR 76 | Temp 97.8°F | Resp 16 | Ht 62.0 in | Wt 150.2 lb

## 2023-09-16 DIAGNOSIS — Z86718 Personal history of other venous thrombosis and embolism: Secondary | ICD-10-CM | POA: Diagnosis not present

## 2023-09-16 DIAGNOSIS — E782 Mixed hyperlipidemia: Secondary | ICD-10-CM

## 2023-09-16 DIAGNOSIS — J4520 Mild intermittent asthma, uncomplicated: Secondary | ICD-10-CM | POA: Diagnosis not present

## 2023-09-16 DIAGNOSIS — Z23 Encounter for immunization: Secondary | ICD-10-CM | POA: Diagnosis not present

## 2023-09-16 DIAGNOSIS — F32A Depression, unspecified: Secondary | ICD-10-CM

## 2023-09-16 DIAGNOSIS — K219 Gastro-esophageal reflux disease without esophagitis: Secondary | ICD-10-CM | POA: Diagnosis not present

## 2023-09-16 DIAGNOSIS — E039 Hypothyroidism, unspecified: Secondary | ICD-10-CM | POA: Diagnosis not present

## 2023-09-16 DIAGNOSIS — I10 Essential (primary) hypertension: Secondary | ICD-10-CM

## 2023-09-16 MED ORDER — METOPROLOL SUCCINATE ER 25 MG PO TB24: 25.0000 mg | ORAL_TABLET | Freq: Two times a day (BID) | ORAL | 1 refills | Status: DC

## 2023-09-16 MED ORDER — LEVOTHYROXINE SODIUM 88 MCG PO TABS: 88.0000 ug | ORAL_TABLET | Freq: Every day | ORAL | 1 refills | Status: DC

## 2023-09-16 MED ORDER — PANTOPRAZOLE SODIUM 40 MG PO TBEC: 40.0000 mg | DELAYED_RELEASE_TABLET | Freq: Every day | ORAL | 1 refills | Status: DC

## 2023-09-16 MED ORDER — FLUOXETINE HCL 20 MG PO TABS: 20.0000 mg | ORAL_TABLET | Freq: Every day | ORAL | 3 refills | Status: DC

## 2023-09-16 MED ORDER — SIMVASTATIN 5 MG PO TABS: 5.0000 mg | ORAL_TABLET | Freq: Every day | ORAL | 1 refills | Status: DC

## 2023-09-16 MED ORDER — FUROSEMIDE 20 MG PO TABS: 20.0000 mg | ORAL_TABLET | Freq: Every day | ORAL | 1 refills | Status: DC

## 2023-09-16 MED ORDER — AMLODIPINE BESYLATE 10 MG PO TABS: 10.0000 mg | ORAL_TABLET | Freq: Every day | ORAL | 1 refills | Status: DC

## 2023-09-16 NOTE — Patient Instructions (Addendum)
It was great seeing you today!  Plan discussed at today's visit: -All medications refilled - increase Prozac to 20 mg daily. If you have break through anxiety symptoms we can discuss adding something like Hydroxyzine -Pneumonia vaccine given today  -Please bring records to next visit   Follow up in: 3 months   Take care and let us know if you have any questions or concerns prior to your next visit.  Dr. Caralee Ates

## 2023-09-16 NOTE — Progress Notes (Signed)
New Patient Office Visit  Subjective    Patient ID: Pamela Reed, female    DOB: 01-20-1931  Age: 89 y.o. MRN: [id number]  CC:  Chief Complaint  Patient presents with   Establish Care    Specialist: DERM, UROLOGY, GI    HPI Pamela Reed presents to establish care. She was a patient of Dr. Johny Chess. She is here with her daughter today.   Hypertension: -Medications: Amlodipine 10 mg, Metoprolol 25 XL mg BID, Lasix 20 mg daily  -Patient is compliant with above medications and reports no side effects. -Had been on Lisinopril but discontinued - not sure why -Checking BP at home (average): Not checking  -Denies any SOB, CP, vision changes, LE edema or symptoms of hypotension  HLD/Hx of DVT in left lower extremity: -Medications: Zocor 5 mg -Patient is compliant with above medications and reports no side effects.  -Last lipid panel: uncertain, bringing records -History of LLE DVT after she was hospitalized for diverticulitis, was treated for 3 months with anticoagulation, no other clots since  COPD/Asthma : -COPD status: stable -Current medications: Albuterol PRN -Satisfied with current treatment?: yes -Oxygen use: no -Dyspnea frequency: none -Cough frequency: none -Rescue inhaler frequency:  occasional  -Productive cough: no -Pneumovax: Not up to Date -Influenza: Up to Date  Hypothyroidism: -Medications: Levothyroxine 88 mcg -Patient is compliant with the above medication (s) at the above dose and reports no medication side effects.  -Denies weight changes, cold./heat intolerance, skin changes, anxiety/palpitations  -Last TSH: uncertain, obtaining records   GERD/hiatal hernia: -Currently on Protonix 40 mg -Recently had a CT scan 9/24 with hiatal hernia increasing in size, colonic diverticulosis  -Following with GI at [facility] for diarrhea - now on Cholestyramine 4 gm oce a day   MDD/Anxiety: -Currently on Prozac 10 mg, Xanax PRN -Going through stressful events  currently   Glaucoma:  -On eye drops, following with Ophthalmology   Health Maintenance: -Blood work - had labs 3 months ago, will bring them to follow up -Prevnar 20 due  Outpatient Encounter Medications as of 09/16/2023  Medication Sig   acetaminophen (TYLENOL) 500 MG tablet Take 500 mg by mouth every 4 (four) hours as needed for mild pain, moderate pain, headache or fever.   albuterol (VENTOLIN HFA) 108 (90 Base) MCG/ACT inhaler Inhale 2 puffs into the lungs every 6 (six) hours as needed for wheezing or shortness of breath.   amLODipine (NORVASC) 5 MG tablet Take 1 tablet (5 mg total) by mouth daily. (Patient taking differently: Take 10 mg by mouth daily.)   Calcium Carb-Cholecalciferol 600-10 MG-MCG TABS Take 1 tablet by mouth daily.   calcium carbonate (TUMS - DOSED IN MG ELEMENTAL CALCIUM) 500 MG chewable tablet Chew 1 tablet by mouth daily.   cholestyramine light (PREVALITE) 4 g packet Take 4 g by mouth 2 (two) times daily.   conjugated estrogens (PREMARIN) vaginal cream Place 1 applicator vaginally as needed.   ferrous sulfate 325 (65 FE) MG tablet Take 1 tablet (325 mg total) by mouth daily with breakfast.   FLUoxetine (PROZAC) 10 MG tablet Take 10 mg by mouth daily.    fluticasone (FLONASE) 50 MCG/ACT nasal spray Place 2 sprays into both nostrils daily.   furosemide (LASIX) 20 MG tablet Take 1 tablet (20 mg total) by mouth daily. Please hold until diarrhea completely resolved (Patient taking differently: Take 20 mg by mouth daily.)   levothyroxine (SYNTHROID) 88 MCG tablet Take 88 mcg by mouth daily before breakfast.   metoprolol  succinate (TOPROL-XL) 25 MG 24 hr tablet Take 3 tablets (75 mg total) by mouth daily. (Patient taking differently: Take 25 mg by mouth 2 (two) times daily.)   Netarsudil Dimesylate (RHOPRESSA) 0.02 % SOLN Place 1 drop into both eyes at bedtime.   pantoprazole (PROTONIX) 40 MG tablet Take 40 mg by mouth daily.   simvastatin (ZOCOR) 5 MG tablet Take 5 mg by  mouth at bedtime.   VYZULTA 0.024 % SOLN Place 1 drop into both eyes at bedtime.    [DISCONTINUED] ALPRAZolam (XANAX) 0.25 MG tablet Take 1 tablet (0.25 mg total) by mouth 2 (two) times daily as needed.   [DISCONTINUED] aspirin 81 MG tablet Take 81 mg by mouth daily.   [DISCONTINUED] B Complex Vitamins (VITAMIN B-COMPLEX) TABS Take 1 tablet by mouth daily.   [DISCONTINUED] bimatoprost (LATISSE) 0.03 % ophthalmic solution Place 1 drop into both eyes daily as needed (glaucoma). Place one drop on applicator and apply evenly along the skin of the upper eyelid at base of eyelashes once daily at bedtime; repeat procedure for second eye (use a clean applicator).   [DISCONTINUED] levothyroxine (SYNTHROID) 25 MCG tablet Take 50 mcg by mouth daily before breakfast.   [DISCONTINUED] lisinopril (ZESTRIL) 20 MG tablet Take 1 tablet (20 mg total) by mouth daily.   [DISCONTINUED] Multiple Vitamin (MULTIVITAMIN WITH MINERALS) TABS tablet Take 1 tablet by mouth daily.   [DISCONTINUED] pantoprazole (PROTONIX) 40 MG tablet Take 1 tablet (40 mg total) by mouth 2 (two) times daily.   [DISCONTINUED] SYMBICORT 80-4.5 MCG/ACT inhaler Inhale 2 puffs into the lungs daily at 6 (six) AM.   No facility-administered encounter medications on file as of 09/16/2023.    Past Medical History:  Diagnosis Date   Arthritis    spine   Cervicalgia    Chronic cystitis    COPD (chronic obstructive pulmonary disease) (HCC)    Deafness in left ear    s/p skull fracture - age 59   Depression    GERD (gastroesophageal reflux disease)    Headache    daily. 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   Hyperlipidemia    Hypertension    Hypothyroidism    Mixed incontinence urge and stress    Vertigo    Wears hearing aid    right ear    Past Surgical History:  Procedure Laterality Date   ABDOMINAL HYSTERECTOMY     CATARACT EXTRACTION W/ INTRAOCULAR LENS IMPLANT      CHOLECYSTECTOMY     COLONOSCOPY     ESOPHAGOGASTRODUODENOSCOPY  04/21/2022   [place]: mildly torturous esophagus, 7 cm hiatal hernia, oetherwise normal stomach and duodenum   ESOPHAGOGASTRODUODENOSCOPY (EGD) WITH PROPOFOL N/A 05/12/2021   non-obstructing Schatzki ring s/p dilation, 6 cm hiatal hernia, normal stomach, normal duodenum   ESOPHAGOGASTRODUODENOSCOPY (EGD) WITH PROPOFOL N/A 05/05/2022   Procedure: ESOPHAGOGASTRODUODENOSCOPY (EGD) WITH PROPOFOL;  Surgeon: Dolores Frame, MD;  [facility];  Service: Gastroenterology;  Laterality: N/A;   EYE SURGERY Bilateral    eye shunts   FOOT SURGERY     GIVENS CAPSULE STUDY N/A 05/05/2022   Procedure: GIVENS CAPSULE STUDY;  Surgeon: Dolores Frame, MD;  [facility];  Service: Gastroenterology;  Laterality: N/A;   GLAUCOMA SURGERY     PHOTOCOAGULATION WITH LASER Left 09/07/2016   Procedure: PHOTOCOAGULATION WITH LASER;  Surgeon: Sherald Hess, MD;  Location: [facility];  Service: Ophthalmology;  Laterality: Left;  LEFT   TONSILLECTOMY      Family History  Problem Relation Age of Onset   Heart disease Mother    Hypertension Mother    Heart failure Mother    Kidney disease Father    Stroke Father    Colon polyps Neg Hx    Colon cancer Neg Hx     Social History   Socioeconomic History   Marital status: Single    Spouse name: Not on file   Number of children: Not on file   Years of education: Not on file   Highest education level: Not on file  Occupational History   Not on file  Tobacco Use   Smoking status: Never   Smokeless tobacco: Never  Vaping Use   Vaping status: Never Used  Substance and Sexual Activity   Alcohol use: No   Drug use: No   Sexual activity: Not Currently    Birth control/protection: Surgical, Post-menopausal  Other Topics Concern   Not on file  Social History Narrative   Not on file   Social Determinants of Health   Financial  Resource Strain: Low Risk  (04/17/2022)   Received from [facility], [facility]   Overall Financial Resource Strain (CARDIA)    Difficulty of Paying Living Expenses: Not hard at all  Food Insecurity: No Food Insecurity (04/17/2022)   Received from [facility], [facility]   Hunger Vital Sign    Worried About Running Out of Food in the Last Year: Never true    Ran Out of Food in the Last Year: Never true  Transportation Needs: No Transportation Needs (04/17/2022)   Received from [facility], [facility]   [facility] - Transportation    Lack of Transportation (Medical): No    Lack of Transportation (Non-Medical): No  Physical Activity: Not on file  Stress: Not on file  Social Connections: Not on file  Intimate Partner Violence: Not on file    Review of Systems  Constitutional:  Negative for chills and fever.  Respiratory:  Negative for cough, shortness of breath and wheezing.   Cardiovascular:  Negative for chest pain.  Gastrointestinal:  Positive for abdominal pain. Negative for nausea.        Objective    BP 122/72   Pulse 76   Temp 97.8 F (36.6 C)   Resp 16   Ht 5\' 2"  (1.575 m)   Wt 150 lb 3.2 oz (68.1 kg)   SpO2 97%   BMI 27.47 kg/m   Physical Exam Constitutional:      Appearance: Normal appearance.  HENT:     Head: Normocephalic and atraumatic.  Cardiovascular:     Rate and Rhythm: Normal rate and regular rhythm.  Pulmonary:     Effort: Pulmonary effort is normal.     Breath sounds: Normal breath sounds.  Musculoskeletal:     Right lower leg: No edema.     Left lower leg: No edema.  Skin:    General: Skin is warm and dry.  Neurological:     General: No focal deficit present.     Mental Status: She is alert. Mental status is at baseline.  Psychiatric:        Mood and Affect: Mood normal.        Behavior: Behavior normal.         Assessment & Plan:   1. Essential hypertension: Blood pressure  stable here today, no changes  made to medications and appropriate refills sent to pharmacy.   - amLODipine (NORVASC) 10 MG tablet; Take 1 tablet (10 mg total) by mouth daily.  Dispense: 90 tablet; Refill: 1 - metoprolol succinate (TOPROL-XL) 25 MG 24 hr tablet; Take 1 tablet (25 mg total) by mouth 2 (two) times daily.  Dispense: 180 tablet; Refill: 1 - furosemide (LASIX) 20 MG tablet; Take 1 tablet (20 mg total) by mouth daily.  Dispense: 90 tablet; Refill: 1  2. Mixed hyperlipidemia: Patient to bring previous labs, continue statin.   - simvastatin (ZOCOR) 5 MG tablet; Take 1 tablet (5 mg total) by mouth at bedtime.  Dispense: 90 tablet; Refill: 1  3. History of DVT of lower extremity: No longer on anticoagulation.   4. Mild intermittent asthma without complication: Symptoms stable with Albuterol PRN.  5. Hypothyroidism, unspecified type: Will bring labs, continue Levothyroxine 88 mcg for now.   - levothyroxine (SYNTHROID) 88 MCG tablet; Take 1 tablet (88 mcg total) by mouth daily before breakfast.  Dispense: 90 tablet; Refill: 1  6. Gastroesophageal reflux disease, unspecified whether esophagitis present: On Protonix 40 mg daily for GERD secondary to hiatal hernia, also on pancreatic enzymes per GI. Seeing them again in January.  - cholestyramine light (PREVALITE) 4 g packet; Take 4 g by mouth 2 (two) times daily. - pantoprazole (PROTONIX) 40 MG tablet; Take 1 tablet (40 mg total) by mouth daily.  Dispense: 90 tablet; Refill: 1  7. Depression, unspecified depression type: Uncontrolled, will increase Prozac to 20 mg daily. Discussed that I will not refill Xanax as needed, briefly discussed something like Hydroxyzine instead.   - FLUoxetine (PROZAC) 20 MG tablet; Take 1 tablet (20 mg total) by mouth daily.  Dispense: 90 tablet; Refill: 3  8. Vaccine for streptococcus pneumoniae and influenza: Prevnar 20 administered.   - Pneumococcal conjugate vaccine 20-valent (Prevnar 20)   Return in 3 months (on 12/17/2023).    Margarita Mail, DO

## 2023-12-10 DIAGNOSIS — H43813 Vitreous degeneration, bilateral: Secondary | ICD-10-CM | POA: Diagnosis not present

## 2023-12-10 DIAGNOSIS — H401133 Primary open-angle glaucoma, bilateral, severe stage: Secondary | ICD-10-CM | POA: Diagnosis not present

## 2023-12-10 DIAGNOSIS — Z961 Presence of intraocular lens: Secondary | ICD-10-CM | POA: Diagnosis not present

## 2023-12-21 ENCOUNTER — Encounter: Payer: Self-pay | Admitting: Internal Medicine

## 2023-12-21 ENCOUNTER — Other Ambulatory Visit: Payer: Self-pay

## 2023-12-21 ENCOUNTER — Ambulatory Visit (INDEPENDENT_AMBULATORY_CARE_PROVIDER_SITE_OTHER): Payer: Medicare HMO | Admitting: Internal Medicine

## 2023-12-21 VITALS — BP 132/80 | HR 72 | Temp 98.1°F | Resp 14 | Ht 62.0 in | Wt 153.0 lb

## 2023-12-21 DIAGNOSIS — E039 Hypothyroidism, unspecified: Secondary | ICD-10-CM | POA: Diagnosis not present

## 2023-12-21 DIAGNOSIS — K219 Gastro-esophageal reflux disease without esophagitis: Secondary | ICD-10-CM | POA: Diagnosis not present

## 2023-12-21 DIAGNOSIS — E782 Mixed hyperlipidemia: Secondary | ICD-10-CM | POA: Insufficient documentation

## 2023-12-21 DIAGNOSIS — I10 Essential (primary) hypertension: Secondary | ICD-10-CM

## 2023-12-21 DIAGNOSIS — F32A Depression, unspecified: Secondary | ICD-10-CM | POA: Diagnosis not present

## 2023-12-21 MED ORDER — SIMVASTATIN 5 MG PO TABS: 5.0000 mg | ORAL_TABLET | Freq: Every day | ORAL | 1 refills | Status: DC

## 2023-12-21 MED ORDER — PANTOPRAZOLE SODIUM 40 MG PO TBEC: 40.0000 mg | DELAYED_RELEASE_TABLET | Freq: Every day | ORAL | 1 refills | Status: DC

## 2023-12-21 NOTE — Assessment & Plan Note (Signed)
Stable, doing well on Prozac 20 mg.

## 2023-12-21 NOTE — Assessment & Plan Note (Signed)
Plan to recheck labs at follow up, refills statin.

## 2023-12-21 NOTE — Assessment & Plan Note (Signed)
Blood pressure good, will decrease lasix to 10 mg daily due to urinary frequency. Continue all other medications.

## 2023-12-21 NOTE — Assessment & Plan Note (Signed)
Plan to recheck labs at follow up, continue Levothyroxine.

## 2023-12-21 NOTE — Assessment & Plan Note (Signed)
Stable, refill PPI.

## 2023-12-21 NOTE — Patient Instructions (Addendum)
It was great seeing you today!  Plan discussed at today's visit: -Cut Lasix in half to 10 mg daily  -Work on finding out about RSV and Shingles vaccines  -Keep skin moisturized   Follow up in: 3 months   Take care and let us know if you have any questions or concerns prior to your next visit.  Dr. Caralee Ates

## 2023-12-21 NOTE — Progress Notes (Signed)
Established Patient Office Visit  Subjective   Patient ID: Pamela Reed, female    DOB: January 21, 1931  Age: 89 y.o. MRN: [id number]  Chief Complaint  Patient presents with   Medical Management of Chronic Issues    3 month recheck    HPI  Pamela Reed presents for recheck.  She is here with her daughter and niece.   Hypertension: -Medications: Amlodipine 10 mg, Metoprolol 25 XL mg BID, Lasix 20 mg daily  -Patient is compliant with above medications and reports no side effects. -Had been on Lisinopril but discontinued - not sure why -Checking BP at home (average): Not checking  -Denies any SOB, CP, vision changes, LE edema or symptoms of hypotension  HLD/Hx of DVT in left lower extremity: -Medications: Zocor 5 mg -Patient is compliant with above medications and reports no side effects.  -Last lipid panel: uncertain -History of LLE DVT after she was hospitalized for diverticulitis, was treated for 3 months with anticoagulation, no other clots since  COPD/Asthma : -COPD status: stable -Current medications: Albuterol PRN -Satisfied with current treatment?: yes -Oxygen use: no -Dyspnea frequency: none -Cough frequency: none -Rescue inhaler frequency:  occasional  -Productive cough: no -Pneumovax: UTD -Influenza: Up to Date  Hypothyroidism: -Medications: Levothyroxine 88 mcg -Patient is compliant with the above medication (s) at the above dose and reports no medication side effects.  -Denies weight changes, cold./heat intolerance, skin changes, anxiety/palpitations  -Last TSH: uncertain   GERD/hiatal hernia: -Currently on Protonix 40 mg -Recently had a CT scan 9/24 with hiatal hernia increasing in size, colonic diverticulosis  -Following with GI at [facility] for diarrhea - now on Cholestyramine 4 gm oce a day   MDD/Anxiety: -Currently on Prozac increased to 20 mg at LOV, Xanax PRN  Glaucoma:  -On eye drops, following with Ophthalmology   Health Maintenance: -Blood  work - had labs in April, scanned into chart -Thinks she had shingles and RSV vaccines will bring records   Patient Active Problem List   Diagnosis Date Noted   Mixed hyperlipidemia 12/21/2023   History of DVT of lower extremity 09/16/2023   Gastroesophageal reflux disease 09/16/2023   Anemia    Iron deficiency anemia 05/07/2022   Heme positive stool 05/06/2022   Thrombocytosis 05/05/2022   Diverticulitis 04/11/2022   Physical deconditioning 04/11/2022   Known medical problems 04/11/2022   Hyponatremia 03/17/2021   Hypokalemia 03/17/2021   Dysphagia 03/17/2021   Essential hypertension 03/17/2021   Hypothyroidism 03/17/2021   Anxiety 03/17/2021   Depression 03/17/2021   Glaucoma 03/17/2021   RLS (restless legs syndrome) 01/01/2017   B12 deficiency 11/18/2016   Chronic cystitis 12/17/2012   Mixed urge and stress incontinence 12/17/2012   Past Medical History:  Diagnosis Date   Arthritis    spine   Cervicalgia    Chronic cystitis    COPD (chronic obstructive pulmonary disease) (HCC)    Deafness in left ear    s/p skull fracture - age 78   Depression    GERD (gastroesophageal reflux disease)    Headache    daily. 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   Hyperlipidemia    Hypertension    Hypothyroidism    Mixed incontinence urge and stress    Vertigo    Wears hearing aid    right ear   Past Surgical History:  Procedure Laterality Date  ABDOMINAL HYSTERECTOMY     CATARACT EXTRACTION W/ INTRAOCULAR LENS IMPLANT     CHOLECYSTECTOMY     COLONOSCOPY     ESOPHAGOGASTRODUODENOSCOPY  04/21/2022   [place]: mildly torturous esophagus, 7 cm hiatal hernia, oetherwise normal stomach and duodenum   ESOPHAGOGASTRODUODENOSCOPY (EGD) WITH PROPOFOL N/A 05/12/2021   non-obstructing Schatzki ring s/p dilation, 6 cm hiatal hernia, normal stomach, normal duodenum   ESOPHAGOGASTRODUODENOSCOPY (EGD) WITH PROPOFOL  N/A 05/05/2022   Procedure: ESOPHAGOGASTRODUODENOSCOPY (EGD) WITH PROPOFOL;  Surgeon: Dolores Frame, MD;  [facility];  Service: Gastroenterology;  Laterality: N/A;   EYE SURGERY Bilateral    eye shunts   FOOT SURGERY     GIVENS CAPSULE STUDY N/A 05/05/2022   Procedure: GIVENS CAPSULE STUDY;  Surgeon: Dolores Frame, MD;  [facility];  Service: Gastroenterology;  Laterality: N/A;   GLAUCOMA SURGERY     PHOTOCOAGULATION WITH LASER Left 09/07/2016   Procedure: PHOTOCOAGULATION WITH LASER;  Surgeon: Sherald Hess, MD;  Location: [facility];  Service: Ophthalmology;  Laterality: Left;  LEFT   TONSILLECTOMY     Social History   Tobacco Use   Smoking status: Never   Smokeless tobacco: Never  Vaping Use   Vaping status: Never Used  Substance Use Topics   Alcohol use: No   Drug use: No   Social History   Socioeconomic History   Marital status: Single    Spouse name: Not on file   Number of children: Not on file   Years of education: Not on file   Highest education level: Not on file  Occupational History   Not on file  Tobacco Use   Smoking status: Never   Smokeless tobacco: Never  Vaping Use   Vaping status: Never Used  Substance and Sexual Activity   Alcohol use: No   Drug use: No   Sexual activity: Not Currently    Birth control/protection: Surgical, Post-menopausal  Other Topics Concern   Not on file  Social History Narrative   Not on file   Social Drivers of Health   Financial Resource Strain: Low Risk  (04/17/2022)   Received from [facility], [facility]   Overall Financial Resource Strain (CARDIA)    Difficulty of Paying Living Expenses: Not hard at all  Food Insecurity: No Food Insecurity (04/17/2022)   Received from [facility], [facility]   Hunger Vital Sign    Worried About Running Out of Food in the Last Year: Never true    Ran Out of Food in the Last Year: Never true   Transportation Needs: No Transportation Needs (04/17/2022)   Received from [facility], [facility]   [facility] - Transportation    Lack of Transportation (Medical): No    Lack of Transportation (Non-Medical): No  Physical Activity: Not on file  Stress: Not on file  Social Connections: Not on file  Intimate Partner Violence: Not on file   Family Status  Relation Name Status   Mother  Alive   Father  Alive   Neg Hx  (Not Specified)  No partnership data on file   Family History  Problem Relation Age of Onset   Heart disease Mother    Hypertension Mother    Heart failure Mother    Kidney disease Father    Stroke Father    Colon polyps Neg Hx    Colon cancer Neg Hx    Allergies  Allergen Reactions  Contrast Media [Iodinated Contrast Media] Shortness Of Breath   Iodine Shortness Of Breath   Sulfasalazine Itching and Swelling   Dye Fdc Red [Red Dye #40 (Allura Red)]    Lactose Intolerance (Gi)     GI upset   Penicillins     Childhood event-unknown reaction   Shellfish Allergy Nausea Only and Swelling    Throat swelling   Sulfa Antibiotics Itching and Swelling   Venlafaxine Swelling    Throat   Codeine Palpitations   Ropinirole Nausea Only      Review of Systems  All other systems reviewed and are negative.     Objective:     BP 132/80 (Cuff Size: Normal)   Pulse 72   Temp 98.1 F (36.7 C) (Oral)   Resp 14   Ht 5\' 2"  (1.575 m)   Wt 153 lb (69.4 kg)   SpO2 96%   BMI 27.98 kg/m  BP Readings from Last 3 Encounters:  12/21/23 132/80  09/16/23 122/72  06/17/23 (!) 157/71   Wt Readings from Last 3 Encounters:  12/21/23 153 lb (69.4 kg)  09/16/23 150 lb 3.2 oz (68.1 kg)  06/17/23 148 lb (67.1 kg)      Physical Exam Constitutional:      Appearance: Normal appearance.  HENT:     Head: Normocephalic and atraumatic.  Cardiovascular:     Rate and Rhythm: Normal rate and regular rhythm.  Pulmonary:     Effort: Pulmonary effort is normal.      Breath sounds: Normal breath sounds.  Musculoskeletal:     Right lower leg: No edema.     Left lower leg: No edema.  Skin:    General: Skin is warm and dry.     Comments: Patch of dry skin on back  Neurological:     General: No focal deficit present.     Mental Status: She is alert. Mental status is at baseline.  Psychiatric:        Mood and Affect: Mood normal.        Behavior: Behavior normal.      Results for orders placed or performed in visit on 12/21/23  HM DEXA SCAN  Result Value Ref Range   HM Dexa Scan osteopenia     Last CBC Lab Results  Component Value Date   WBC 8.7 06/17/2023   HGB 11.7 (L) 06/17/2023   HCT 36.9 06/17/2023   MCV 87.0 06/17/2023   MCH 27.6 06/17/2023   RDW 15.3 06/17/2023   PLT 358 06/17/2023   Last metabolic panel Lab Results  Component Value Date   GLUCOSE 140 (H) 06/17/2023   NA 138 06/17/2023   K 3.7 06/17/2023   CL 104 06/17/2023   CO2 24 06/17/2023   BUN 15 06/17/2023   CREATININE 1.12 (H) 06/17/2023   GFRNONAA 47 (L) 06/17/2023   CALCIUM 9.2 06/17/2023   PROT 7.7 06/17/2023   ALBUMIN 3.9 06/17/2023   BILITOT 0.4 06/17/2023   ALKPHOS 92 06/17/2023   AST 19 06/17/2023   ALT 11 06/17/2023   ANIONGAP 10 06/17/2023   Last lipids No results found for: "CHOL", "HDL", "LDLCALC", "LDLDIRECT", "TRIG", "CHOLHDL" Last hemoglobin A1c Lab Results  Component Value Date   HGBA1C 5.8 (H) 03/17/2021   Last thyroid functions Lab Results  Component Value Date   TSH 1.063 03/17/2021   Last vitamin D No results found for: "25OHVITD2", "25OHVITD3", "VD25OH" Last vitamin B12 and Folate Lab Results  Component Value Date   VITAMINB12 513 05/05/2022  FOLATE 8.2 05/05/2022      The ASCVD Risk score (Arnett DK, et al., 2019) failed to calculate for the following reasons:   The 2019 ASCVD risk score is only valid for ages 80 to 95    Assessment & Plan:  Essential hypertension Assessment & Plan: Blood pressure good, will  decrease lasix to 10 mg daily due to urinary frequency. Continue all other medications.   Mixed hyperlipidemia Assessment & Plan: Plan to recheck labs at follow up, refills statin.   Orders: -     Simvastatin; Take 1 tablet (5 mg total) by mouth at bedtime.  Dispense: 90 tablet; Refill: 1  Gastroesophageal reflux disease, unspecified whether esophagitis present Assessment & Plan: Stable, refill PPI.  Orders: -     Pantoprazole Sodium; Take 1 tablet (40 mg total) by mouth daily.  Dispense: 90 tablet; Refill: 1  Hypothyroidism, unspecified type Assessment & Plan: Plan to recheck labs at follow up, continue Levothyroxine.   Depression, unspecified depression type Assessment & Plan: Stable, doing well on Prozac 20 mg.      Return in about 3 months (around 03/20/2024).    Margarita Mail, DO

## 2024-01-03 DIAGNOSIS — R197 Diarrhea, unspecified: Secondary | ICD-10-CM | POA: Diagnosis not present

## 2024-01-03 DIAGNOSIS — R634 Abnormal weight loss: Secondary | ICD-10-CM | POA: Diagnosis not present

## 2024-02-08 ENCOUNTER — Encounter: Payer: Self-pay | Admitting: Family Medicine

## 2024-02-08 ENCOUNTER — Ambulatory Visit
Admission: RE | Admit: 2024-02-08 | Discharge: 2024-02-08 | Disposition: A | Discharge status: Home / Self Care | Attending: Family Medicine | Admitting: Family Medicine

## 2024-02-08 ENCOUNTER — Ambulatory Visit
Admission: RE | Admit: 2024-02-08 | Discharge: 2024-02-08 | Disposition: A | Discharge status: Home / Self Care | Source: Ambulatory Visit | Attending: Family Medicine

## 2024-02-08 ENCOUNTER — Ambulatory Visit: Payer: Self-pay | Admitting: Internal Medicine

## 2024-02-08 ENCOUNTER — Ambulatory Visit (INDEPENDENT_AMBULATORY_CARE_PROVIDER_SITE_OTHER): Admitting: Family Medicine

## 2024-02-08 VITALS — BP 118/70 | HR 76 | Temp 98.0°F | Resp 16 | Ht 62.0 in | Wt 153.0 lb

## 2024-02-08 DIAGNOSIS — K449 Diaphragmatic hernia without obstruction or gangrene: Secondary | ICD-10-CM | POA: Diagnosis not present

## 2024-02-08 DIAGNOSIS — R0602 Shortness of breath: Secondary | ICD-10-CM | POA: Diagnosis not present

## 2024-02-08 DIAGNOSIS — R0989 Other specified symptoms and signs involving the circulatory and respiratory systems: Secondary | ICD-10-CM | POA: Insufficient documentation

## 2024-02-08 DIAGNOSIS — J4520 Mild intermittent asthma, uncomplicated: Secondary | ICD-10-CM

## 2024-02-08 DIAGNOSIS — J302 Other seasonal allergic rhinitis: Secondary | ICD-10-CM

## 2024-02-08 DIAGNOSIS — R059 Cough, unspecified: Secondary | ICD-10-CM | POA: Diagnosis not present

## 2024-02-08 DIAGNOSIS — J069 Acute upper respiratory infection, unspecified: Secondary | ICD-10-CM

## 2024-02-08 LAB — POCT INFLUENZA A/B
Influenza A, POC: NEGATIVE
Influenza B, POC: NEGATIVE

## 2024-02-08 MED ORDER — BUDESONIDE-FORMOTEROL FUMARATE 80-4.5 MCG/ACT IN AERO: 2.0000 | INHALATION_SPRAY | Freq: Two times a day (BID) | RESPIRATORY_TRACT | 3 refills | Status: AC

## 2024-02-08 MED ORDER — ALBUTEROL SULFATE HFA 108 (90 BASE) MCG/ACT IN AERS: 2.0000 | INHALATION_SPRAY | RESPIRATORY_TRACT | 1 refills | Status: AC | PRN

## 2024-02-08 MED ORDER — PREDNISONE 20 MG PO TABS: 40.0000 mg | ORAL_TABLET | Freq: Every day | ORAL | 0 refills | Status: AC

## 2024-02-08 NOTE — Telephone Encounter (Signed)
 Red Word that prompted transfer to Nurse Triage: The patient called in stating she has had shortness of breath which is worse than normal as well as a cough, sore throat, and a headache. With these symptoms along with her age I will transfer her to [facility]     Chief Complaint: Cough, runny nose, sore throat. Symptoms: Above Frequency: Friday Pertinent Negatives: Patient denies fever Disposition: [] ED /[] Urgent Care (no appt availability in office) / [x] Appointment(In office/virtual)/ []  [facility]/ [] Home Care/ [] Refused Recommended Disposition /[] [place] [facility]/ []  Follow-up with PCP Additional Notes: Agrees with appointment, needs afternoon to arrange transportation.  Reason for Disposition  [1] MILD difficulty breathing (e.g., minimal/no SOB at rest, SOB with walking, pulse <100) AND [2] NEW-onset or WORSE than normal  Answer Assessment - Initial Assessment Questions 1. RESPIRATORY STATUS: "Describe your breathing?" (e.g., wheezing, shortness of breath, unable to speak, severe coughing)      SOB 2. ONSET: "When did this breathing problem begin?"      Friday 3. PATTERN "Does the difficult breathing come and go, or has it been constant since it started?"      Comes and goes 4. SEVERITY: "How bad is your breathing?" (e.g., mild, moderate, severe)    - MILD: No SOB at rest, mild SOB with walking, speaks normally in sentences, can lie down, no retractions, pulse < 100.    - MODERATE: SOB at rest, SOB with minimal exertion and prefers to sit, cannot lie down flat, speaks in phrases, mild retractions, audible wheezing, pulse 100-120.    - SEVERE: Very SOB at rest, speaks in single words, struggling to breathe, sitting hunched forward, retractions, pulse > 120      Mild-moderate 5. RECURRENT SYMPTOM: "Have you had difficulty breathing before?" If Yes, ask: "When was the last time?" and "What happened that time?"      Yes 6. CARDIAC HISTORY: "Do you have any history of  heart disease?" (e.g., heart attack, angina, bypass surgery, angioplasty)      No 7. LUNG HISTORY: "Do you have any history of lung disease?"  (e.g., pulmonary embolus, asthma, emphysema)     Asthma 8. CAUSE: "What do you think is causing the breathing problem?"      Unsure 9. OTHER SYMPTOMS: "Do you have any other symptoms? (e.g., dizziness, runny nose, cough, chest pain, fever)     Cough, runny nose 10. O2 SATURATION MONITOR:  "Do you use an oxygen saturation monitor (pulse oximeter) at home?" If Yes, ask: "What is your reading (oxygen level) today?" "What is your usual oxygen saturation reading?" (e.g., 95%)       No 11. PREGNANCY: "Is there any chance you are pregnant?" "When was your last menstrual period?"       No 12. TRAVEL: "Have you traveled out of the country in the last month?" (e.g., travel history, exposures)       No  Protocols used: Breathing Difficulty-A-AH

## 2024-02-08 NOTE — Progress Notes (Signed)
 Patient ID: Pamela Reed, female    DOB: Nov 17, 1931, 89 y.o.   MRN: [id number]  PCP: Margarita Mail, DO  Chief Complaint  Patient presents with   Cough    X3 days, w/SOB   Headache    Subjective:   Pamela Reed is a 89 y.o. female, presents to clinic with CC of the following:  HPI  Here with 2-3 d coughing SOB HA watery eyes and congestion.  Around multiple sick contacts.  She also suspects some allergies started so she restarted allegra yesterday She has not been on her symbicort inhaler and doesn't have a rescue inhaler with her She is getting SOB with walking or conversation, cough is dry, hx of asthma on chart She also reports hx of pneumonia She is not sure if she's had fever, denies sweats, sore throat GI upset  Unclear pulm hx - she does not know her inhalers or past lung history Says she went to pulmonology in the past for lung testing and then she was referred to a cardiologist, patient states she is not having lower extremity edema, orthopnea or PND.  No known weight gain  Patient Active Problem List   Diagnosis Date Noted   Mixed hyperlipidemia 12/21/2023   History of DVT of lower extremity 09/16/2023   Gastroesophageal reflux disease 09/16/2023   Anemia    Iron deficiency anemia 05/07/2022   Heme positive stool 05/06/2022   Thrombocytosis 05/05/2022   Diverticulitis 04/11/2022   Physical deconditioning 04/11/2022   Known medical problems 04/11/2022   Hyponatremia 03/17/2021   Hypokalemia 03/17/2021   Dysphagia 03/17/2021   Essential hypertension 03/17/2021   Hypothyroidism 03/17/2021   Anxiety 03/17/2021   Depression 03/17/2021   Glaucoma 03/17/2021   RLS (restless legs syndrome) 01/01/2017   B12 deficiency 11/18/2016   Chronic cystitis 12/17/2012   Mixed urge and stress incontinence 12/17/2012      Current Outpatient Medications:    acetaminophen (TYLENOL) 500 MG tablet, Take 500 mg by mouth every 4 (four) hours as needed for mild  pain, moderate pain, headache or fever., Disp: , Rfl:    albuterol (VENTOLIN HFA) 108 (90 Base) MCG/ACT inhaler, Inhale 2 puffs into the lungs every 6 (six) hours as needed for wheezing or shortness of breath., Disp: , Rfl:    amLODipine (NORVASC) 10 MG tablet, Take 1 tablet (10 mg total) by mouth daily., Disp: 90 tablet, Rfl: 1   Calcium Carb-Cholecalciferol 600-10 MG-MCG TABS, Take 1 tablet by mouth daily., Disp: , Rfl:    calcium carbonate (TUMS - DOSED IN MG ELEMENTAL CALCIUM) 500 MG chewable tablet, Chew 1 tablet by mouth daily., Disp: , Rfl:    cholestyramine light (PREVALITE) 4 g packet, Take 4 g by mouth 2 (two) times daily., Disp: , Rfl:    conjugated estrogens (PREMARIN) vaginal cream, Place 1 applicator vaginally as needed., Disp: , Rfl:    ferrous sulfate 325 (65 FE) MG tablet, Take 1 tablet (325 mg total) by mouth daily with breakfast., Disp: , Rfl: 3   FLUoxetine (PROZAC) 20 MG tablet, Take 1 tablet (20 mg total) by mouth daily., Disp: 90 tablet, Rfl: 3   fluticasone (FLONASE) 50 MCG/ACT nasal spray, Place 2 sprays into both nostrils daily., Disp: , Rfl:    furosemide (LASIX) 20 MG tablet, Take 1 tablet (20 mg total) by mouth daily., Disp: 90 tablet, Rfl: 1   levothyroxine (SYNTHROID) 88 MCG tablet, Take 1 tablet (88 mcg total) by mouth daily before breakfast., Disp: 90  tablet, Rfl: 1   metoprolol succinate (TOPROL-XL) 25 MG 24 hr tablet, Take 1 tablet (25 mg total) by mouth 2 (two) times daily., Disp: 180 tablet, Rfl: 1   Netarsudil Dimesylate (RHOPRESSA) 0.02 % SOLN, Place 1 drop into both eyes at bedtime., Disp: , Rfl:    pantoprazole (PROTONIX) 40 MG tablet, Take 1 tablet (40 mg total) by mouth daily., Disp: 90 tablet, Rfl: 1   simvastatin (ZOCOR) 5 MG tablet, Take 1 tablet (5 mg total) by mouth at bedtime., Disp: 90 tablet, Rfl: 1   VYZULTA 0.024 % SOLN, Place 1 drop into both eyes at bedtime. , Disp: , Rfl:    Allergies  Allergen Reactions   Contrast Media [Iodinated Contrast  Media] Shortness Of Breath   Iodine Shortness Of Breath   Sulfasalazine Itching and Swelling   Dye Fdc Red [Red Dye #40 (Allura Red)]    Lactose Intolerance (Gi)     GI upset   Penicillins     Childhood event-unknown reaction   Shellfish Allergy Nausea Only and Swelling    Throat swelling   Sulfa Antibiotics Itching and Swelling   Venlafaxine Swelling    Throat   Codeine Palpitations   Ropinirole Nausea Only     Social History   Tobacco Use   Smoking status: Never   Smokeless tobacco: Never  Vaping Use   Vaping status: Never Used  Substance Use Topics   Alcohol use: No   Drug use: No      Chart Review Today: I personally reviewed active problem list, medication list, allergies, family history, social history, health maintenance, notes from last encounter, lab results, imaging with the patient/caregiver today.   Review of Systems  Constitutional: Negative.   HENT: Negative.    Eyes: Negative.   Respiratory: Negative.    Cardiovascular: Negative.   Gastrointestinal: Negative.   Endocrine: Negative.   Genitourinary: Negative.   Musculoskeletal: Negative.   Skin: Negative.   Allergic/Immunologic: Negative.   Neurological: Negative.   Hematological: Negative.   Psychiatric/Behavioral: Negative.    All other systems reviewed and are negative.      Objective:   Vitals:   02/08/24 1542  BP: 118/70  Pulse: 76  Resp: 16  Temp: 98 F (36.7 C)  SpO2: 98%  Weight: 153 lb (69.4 kg)  Height: 5\' 2"  (1.575 m)    Body mass index is 27.98 kg/m.  Physical Exam Vitals and nursing note reviewed.  Constitutional:      General: She is not in acute distress.    Appearance: She is well-developed. She is not ill-appearing, toxic-appearing or diaphoretic.     Comments: Elderly female, appears stated age, NAD  HENT:     Head: Normocephalic and atraumatic.     Right Ear: External ear normal.     Left Ear: External ear normal.     Nose: Nose normal.     Mouth/Throat:      Mouth: Mucous membranes are moist.     Pharynx: Oropharynx is clear.  Eyes:     General:        Right eye: Discharge present.        Left eye: Discharge present.    Conjunctiva/sclera: Conjunctivae normal.     Comments: Watery eyes bilaterally  Neck:     Trachea: No tracheal deviation.  Cardiovascular:     Rate and Rhythm: Normal rate and regular rhythm.     Pulses: Normal pulses.     Heart sounds: Normal heart sounds.  Pulmonary:     Effort: Pulmonary effort is normal. No tachypnea, accessory muscle usage, respiratory distress or retractions.     Breath sounds: No stridor. Examination of the right-lower field reveals rales. Examination of the left-lower field reveals rales. Rales present. No wheezing or rhonchi.     Comments: Intermittent coughing Musculoskeletal:     Right lower leg: No edema.     Left lower leg: No edema.  Skin:    General: Skin is warm and dry.     Findings: No rash.  Neurological:     Mental Status: She is alert.     Motor: No abnormal muscle tone.     Coordination: Coordination normal.     Gait: Gait abnormal.  Psychiatric:        Behavior: Behavior normal.      Results for orders placed or performed in visit on 12/22/23  Lab report - scanned   Collection Time: 03/05/23  8:42 AM  Result Value Ref Range   EGFR 44.0        Assessment & Plan:   1. Upper respiratory tract infection, unspecified type (Primary) Onset of sx in the past 2-3 days, multiple sick contacts Pt has some allergies as well, HA, congestion, coughing, denies fever Flu and covid neg here  2. Shortness of breath SOB with ambulation and conversation worse than her baseline She is unsure about her pulm hx and is not using maintenance or rescue inhalers currently Reviewed the chart thoroughly and could not find any past pulmonary consult or PFTs from her prior PCP she was on Symbicort daily.  She has a inhaler with her but has not been using it  Pt new to me with unclear hx   - vague pulm and cardiac hx given today Most apparent is that she is worse than her baseline, she has inhalers but is not using them, and she has some acute URI sx allergies vs viral etiology  - albuterol (VENTOLIN HFA) 108 (90 Base) MCG/ACT inhaler; Inhale 2 puffs into the lungs every 4 (four) hours as needed for wheezing or shortness of breath.  Dispense: 18 g; Refill: 1 - budesonide-formoterol (SYMBICORT) 80-4.5 MCG/ACT inhaler; Inhale 2 puffs into the lungs 2 (two) times daily.  Dispense: 1 each; Refill: 3 - DG Chest 2 View - POCT Influenza A/B  3. Mild intermittent asthma without complication Will tx for mild asthma exacerbation with resuming inhalers and steroid burst - albuterol (VENTOLIN HFA) 108 (90 Base) MCG/ACT inhaler; Inhale 2 puffs into the lungs every 4 (four) hours as needed for wheezing or shortness of breath.  Dispense: 18 g; Refill: 1 - budesonide-formoterol (SYMBICORT) 80-4.5 MCG/ACT inhaler; Inhale 2 puffs into the lungs 2 (two) times daily.  Dispense: 1 each; Refill: 3 - predniSONE (DELTASONE) 20 MG tablet; Take 2 tablets (40 mg total) by mouth daily with breakfast for 5 days.  Dispense: 10 tablet; Refill: 0  4. Seasonal allergies She just restarted her allergy meds Did encourage her to continue allegra  5. Bilateral rales On exam with deep inspiration bibasilar rales, which I do not see in her hx anywhere (CHF, IPF etc) CXR today for further eval No LE edema, weight gain, orthopnea or PND but DOE  - DG Chest 2 View  Pending results will determine additional tx Ordered stat     Danelle Berry, PA-C 02/08/24 3:53 PM

## 2024-03-01 NOTE — Progress Notes (Unsigned)
   There were no vitals taken for this visit.   Subjective:    Patient ID: Pamela Reed, female    DOB: [birth year], 89 y.o.   MRN: [id number]  HPI: Pamela Reed is a 89 y.o. female  No chief complaint on file.   Discussed the use of AI scribe software for clinical note transcription with the patient, who gave verbal consent to proceed.  History of Present Illness          12/21/2023    3:09 PM 09/16/2023   11:11 AM  Depression screen PHQ 2/9  Decreased Interest 0 0  Down, Depressed, Hopeless 0 1  PHQ - 2 Score 0 1  Altered sleeping  0  Tired, decreased energy  0  Change in appetite  0  Feeling bad or failure about yourself   0  Trouble concentrating  0  Moving slowly or fidgety/restless  0  Suicidal thoughts  0  PHQ-9 Score  1  Difficult doing work/chores  Not difficult at all    Relevant past medical, surgical, family and social history reviewed and updated as indicated. Interim medical history since our last visit reviewed. Allergies and medications reviewed and updated.  Review of Systems  Per HPI unless specifically indicated above     Objective:    There were no vitals taken for this visit.  {Vitals History (Optional):23777} Wt Readings from Last 3 Encounters:  02/08/24 153 lb (69.4 kg)  12/21/23 153 lb (69.4 kg)  09/16/23 150 lb 3.2 oz (68.1 kg)    Physical Exam Physical Exam    Results for orders placed or performed in visit on 02/08/24  POCT Influenza A/B   Collection Time: 02/08/24  4:18 PM  Result Value Ref Range   Influenza A, POC Negative Negative   Influenza B, POC Negative Negative   {Labs (Optional):23779}    Assessment & Plan:   Problem List Items Addressed This Visit   None    Assessment and Plan Assessment & Plan         Follow up plan: No follow-ups on file.

## 2024-03-02 ENCOUNTER — Ambulatory Visit: Admitting: Nurse Practitioner

## 2024-03-02 ENCOUNTER — Encounter: Payer: Self-pay | Admitting: Nurse Practitioner

## 2024-03-02 VITALS — BP 122/78 | HR 76 | Temp 98.2°F | Resp 18 | Ht 62.0 in | Wt 154.7 lb

## 2024-03-02 DIAGNOSIS — R309 Painful micturition, unspecified: Secondary | ICD-10-CM | POA: Diagnosis not present

## 2024-03-02 LAB — POCT URINALYSIS DIPSTICK
Bilirubin, UA: NEGATIVE
Blood, UA: POSITIVE
Glucose, UA: NEGATIVE
Ketones, UA: NEGATIVE
Nitrite, UA: NEGATIVE
Protein, UA: NEGATIVE
Spec Grav, UA: 1.02
Urobilinogen, UA: 0.2 U/dL
pH, UA: 6.5

## 2024-03-02 MED ORDER — NITROFURANTOIN MONOHYD MACRO 100 MG PO CAPS: 100.0000 mg | ORAL_CAPSULE | Freq: Two times a day (BID) | ORAL | 0 refills | Status: DC

## 2024-03-03 LAB — URINE CULTURE
MICRO NUMBER:: 16315334
SPECIMEN QUALITY:: ADEQUATE

## 2024-03-04 ENCOUNTER — Encounter: Payer: Self-pay | Admitting: Nurse Practitioner

## 2024-03-06 ENCOUNTER — Ambulatory Visit: Payer: Self-pay

## 2024-03-06 ENCOUNTER — Other Ambulatory Visit (HOSPITAL_COMMUNITY)
Admission: RE | Admit: 2024-03-06 | Discharge: 2024-03-06 | Disposition: A | Discharge status: Home / Self Care | Source: Ambulatory Visit | Attending: Internal Medicine | Admitting: Internal Medicine

## 2024-03-06 ENCOUNTER — Ambulatory Visit: Admitting: Internal Medicine

## 2024-03-06 ENCOUNTER — Other Ambulatory Visit: Payer: Self-pay

## 2024-03-06 ENCOUNTER — Encounter: Payer: Self-pay | Admitting: Internal Medicine

## 2024-03-06 VITALS — BP 130/76 | HR 70 | Temp 97.7°F | Resp 16 | Ht 62.0 in | Wt 154.4 lb

## 2024-03-06 DIAGNOSIS — L292 Pruritus vulvae: Secondary | ICD-10-CM | POA: Insufficient documentation

## 2024-03-06 DIAGNOSIS — N949 Unspecified condition associated with female genital organs and menstrual cycle: Secondary | ICD-10-CM

## 2024-03-06 DIAGNOSIS — N3001 Acute cystitis with hematuria: Secondary | ICD-10-CM

## 2024-03-06 DIAGNOSIS — J3489 Other specified disorders of nose and nasal sinuses: Secondary | ICD-10-CM | POA: Diagnosis not present

## 2024-03-06 LAB — POCT URINALYSIS DIPSTICK
Bilirubin, UA: NEGATIVE
Glucose, UA: NEGATIVE
Ketones, UA: NEGATIVE
Protein, UA: POSITIVE — AB
Spec Grav, UA: 1.02 (ref 1.010–1.025)
Urobilinogen, UA: 0.2 U/dL
pH, UA: 5 (ref 5.0–8.0)

## 2024-03-06 MED ORDER — FLUTICASONE PROPIONATE 50 MCG/ACT NA SUSP
2.0000 | Freq: Every day | NASAL | 1 refills | Status: AC
Start: 2024-03-06 — End: ?

## 2024-03-06 MED ORDER — FLUCONAZOLE 150 MG PO TABS: 150.0000 mg | ORAL_TABLET | Freq: Once | ORAL | 0 refills | Status: AC

## 2024-03-06 NOTE — Patient Instructions (Addendum)
 It was great seeing you today!  Plan discussed at today's visit: -Urine here last week with leukocytes (white blood cells) but urine culture negative for significant bacterial growth. Urine test today with only trace amount of leukocytes but no bacteria. -Complete antibiotic course  -Will send medication for yeast infection today (take 1 pill, can repeat dose in 72 hours if symptoms persist) -Vaginal swab today to rule out other vaginal infections -Recommend nasal saline and nasal steroid (Flonase) 2 sprays on each side daily to help with sinus drainage and hoarse voice  Follow up in: as needed  Take care and let us  know if you have any questions or concerns prior to your next visit.  Dr. Bud Care

## 2024-03-06 NOTE — Progress Notes (Signed)
   Acute Office Visit  Subjective:     Patient ID: Pamela Reed, female    DOB: [birth year], 89 y.o.   MRN: [id number]  Chief Complaint  Patient presents with   Urinary Tract Infection    HPI Patient is in today for recheck of urinary symptoms. She is here with her niece. Was seen here Thursday with similar symptoms, urine culture without significant bacteria. She was started on Macrobid and has been taking for about 3 days, still having symptoms.   Discussed the use of AI scribe software for clinical note transcription with the patient, who gave verbal consent to proceed.  History of Present Illness The patient, with a history of urinary symptoms, presents with increased frequency of urination and vaginal discomfort. She reports a fluctuation in urinary output, with episodes of frequent urination, followed by periods of reduced urination. The patient describes the discomfort as a burning sensation during urination. She also mentions a change in her voice, which she attributes to recent respiratory issues. The patient is currently on a diuretic, which she acknowledges could be contributing to her urinary symptoms. She also mentions being on an antibiotic, which she suspects might have led to a possible yeast infection. The patient denies noticing any blood in her urine or changes in vaginal discharge.    Review of Systems  Constitutional:  Negative for chills and fever.  Genitourinary:  Positive for dysuria, frequency and urgency. Negative for hematuria.        Objective:    BP 130/76 (Cuff Size: Large)   Pulse 70   Temp 97.7 F (36.5 C) (Oral)   Resp 16   Ht 5\' 2"  (1.575 m)   Wt 154 lb 6.4 oz (70 kg)   SpO2 94%   BMI 28.24 kg/m  BP Readings from Last 3 Encounters:  03/06/24 130/76  03/02/24 122/78  02/08/24 118/70   Wt Readings from Last 3 Encounters:  03/06/24 154 lb 6.4 oz (70 kg)  03/02/24 154 lb 11.2 oz (70.2 kg)  02/08/24 153 lb (69.4 kg)      Physical  Exam Constitutional:      Appearance: Normal appearance.  HENT:     Head: Normocephalic and atraumatic.  Eyes:     Conjunctiva/sclera: Conjunctivae normal.  Cardiovascular:     Rate and Rhythm: Normal rate and regular rhythm.  Pulmonary:     Effort: Pulmonary effort is normal.     Breath sounds: Normal breath sounds.  Abdominal:     General: There is no distension.     Palpations: Abdomen is soft.     Tenderness: There is no abdominal tenderness. There is no right CVA tenderness or left CVA tenderness.  Neurological:     Mental Status: She is alert.     No results found for any visits on 03/06/24.      Assessment & Plan:   Assessment & Plan Urinary Symptoms Intermittent dysuria and frequency likely due to diuretic use and possible yeast infection. Urine culture negative for UTI. - Complete current antibiotic course. - Prescribed antifungal medication with instructions for use. - Performed vaginal swab to rule out bacterial vaginosis.  Sinusitis with Postnasal Drip Chronic sinusitis with postnasal drip causing hoarseness due to sinus inflammation and pollen exposure. - Prescribed nasal saline for irrigation. - Prescribed nasal steroid spray (Flonase) for inflammation.   Return for already scheduled.  Margarita Mail, DO

## 2024-03-06 NOTE — Telephone Encounter (Signed)
 Chief Complaint: abd pain Symptoms: abd pain, urine frequency Frequency: since 4/10 Pertinent Negatives: Patient denies hematuria Disposition: [] ED /[] Urgent Care (no appt availability in office) / [x] Appointment(In office/virtual)/ []  [facility]/ [] Home Care/ [] Refused Recommended Disposition /[] [place] [facility]/ []  Follow-up with PCP Additional Notes: pt states that she UTI is getting worse, states that she is feeling feverish and now has abd pain 6/10 in her bladder area. States some nausea and urinary frequency. States she urinated 10 times in 2 hours. States she is able to keep fluids down.   Copied from CRM [id number]. Topic: Clinical - Red Word Triage >> Mar 06, 2024  8:55 AM Donald Frost wrote: Red Word that prompted transfer to Nurse Triage: The patient called in stating she has a really bad urinary infection. She was prescribed nitrofurantoin, macrocrystal-monohydrate, (MACROBID) 100 MG capsule and it isn't helping at all. She states she gets these chronically and when she does she normally takes Cipro and it knocks it out. She states she has had a fever and very bad nausea. Because she feels worse and the meds are not helping I will transfer her to [facility]. Reason for Disposition  [1] Taking antibiotic > 24 hours for UTI AND [2] flank or lower back pain getting WORSE  Answer Assessment - Initial Assessment Questions 1. MAIN SYMPTOM: "What is the main symptom you are concerned about?" (e.g., painful urination, urine frequency)     Bladder pain, frequency 2. BETTER-SAME-WORSE: "Are you getting better, staying the same, or getting worse compared to how you felt at your last visit to the doctor (most recent medical visit)?"     worse 3. PAIN: "How bad is the pain?"  (e.g., Scale 1-10; mild, moderate, or severe)   - MILD (1-3): complains slightly about urination hurting   - MODERATE (4-7): interferes with normal activities     - SEVERE (8-10): excruciating, unwilling or unable  to urinate because of the pain      6/10 4. FEVER: "Do you have a fever?" If Yes, ask: "What is it, how was it measured, and when did it start?"     Feels feverish 5. OTHER SYMPTOMS: "Do you have any other symptoms?" (e.g., blood in the urine, flank pain, vaginal discharge)     Abd pain  7. ANTIBIOTIC: "What antibiotic(s) are you taking?" "How many times per day?"     macrobid 8. ANTIBIOTIC - START DATE: "When did you start taking the antibiotic?"     03/02/24  Protocols used: Urinary Tract Infection on Antibiotic Follow-up Call - [facility]

## 2024-03-08 LAB — CERVICOVAGINAL ANCILLARY ONLY
Bacterial Vaginitis (gardnerella): NEGATIVE
Candida Glabrata: NEGATIVE
Candida Vaginitis: NEGATIVE
Chlamydia: NEGATIVE
Comment: NEGATIVE
Comment: NEGATIVE
Comment: NEGATIVE
Comment: NEGATIVE
Comment: NEGATIVE
Comment: NORMAL
Neisseria Gonorrhea: NEGATIVE
Trichomonas: NEGATIVE

## 2024-03-09 ENCOUNTER — Encounter: Payer: Self-pay | Admitting: Internal Medicine

## 2024-03-17 ENCOUNTER — Ambulatory Visit

## 2024-03-17 VITALS — BP 130/76 | Ht 62.0 in | Wt 150.0 lb

## 2024-03-17 DIAGNOSIS — Z0000 Encounter for general adult medical examination without abnormal findings: Secondary | ICD-10-CM | POA: Diagnosis not present

## 2024-03-17 DIAGNOSIS — Z2821 Immunization not carried out because of patient refusal: Secondary | ICD-10-CM

## 2024-03-17 NOTE — Progress Notes (Signed)
 Because this visit was a virtual/telehealth visit,  certain criteria was not obtained, such a blood pressure, CBG if applicable, and timed get up and go. Any medications not marked as "taking" were not mentioned during the medication reconciliation part of the visit. Any vitals not documented were not able to be obtained due to this being a telehealth visit or patient was unable to self-report a recent blood pressure reading due to a lack of equipment at home via telehealth. Vitals that have been documented are verbally provided by the patient.  Subjective:   Pamela Reed is a 89 y.o. who presents for a Medicare Wellness preventive visit.  Visit Complete: Virtual I connected with  Jolynne C Morton on 03/17/24 by a audio enabled telemedicine application and verified that I am speaking with the correct person using two identifiers.  Patient Location: Home  Provider Location: Home Office  I discussed the limitations of evaluation and management by telemedicine. The patient expressed understanding and agreed to proceed.  Vital Signs: Because this visit was a virtual/telehealth visit, some criteria may be missing or patient reported. Any vitals not documented were not able to be obtained and vitals that have been documented are patient reported.  VideoDeclined- This patient declined Librarian, academic. Therefore the visit was completed with audio only.  Persons Participating in Visit: Patient.  AWV Questionnaire: No: Patient Medicare AWV questionnaire was not completed prior to this visit.  Cardiac Risk Factors include: advanced age (>61men, >75 women);sedentary lifestyle;hypertension;dyslipidemia     Objective:    Today's Vitals   03/17/24 0902  BP: 130/76  Weight: 150 lb (68 kg)  Height: 5\' 2"  (1.575 m)   Body mass index is 27.44 kg/m.     03/17/2024    9:02 AM 06/17/2023    4:45 PM 05/05/2022   10:45 AM 05/05/2022   10:27 AM 05/05/2022    3:00 AM  05/04/2022    6:44 PM 04/11/2022    6:00 PM  Advanced Directives  Does Patient Have a Medical Advance Directive? No Yes No No  No No  Type of Special educational needs teacher of [place];Living will       Would patient like information on creating a medical advance directive? No - Patient declined  No - Patient declined  No - Patient declined  No - Patient declined    Current Medications (verified) Outpatient Encounter Medications as of 03/17/2024  Medication Sig   acetaminophen  (TYLENOL ) 500 MG tablet Take 500 mg by mouth every 4 (four) hours as needed for mild pain, moderate pain, headache or fever.   albuterol  (VENTOLIN  HFA) 108 (90 Base) MCG/ACT inhaler Inhale 2 puffs into the lungs every 4 (four) hours as needed for wheezing or shortness of breath.   amLODipine  (NORVASC ) 10 MG tablet Take 1 tablet (10 mg total) by mouth daily.   budesonide -formoterol  (SYMBICORT ) 80-4.5 MCG/ACT inhaler Inhale 2 puffs into the lungs 2 (two) times daily.   Calcium Carb-Cholecalciferol 600-10 MG-MCG TABS Take 1 tablet by mouth daily.   calcium carbonate (TUMS - DOSED IN MG ELEMENTAL CALCIUM) 500 MG chewable tablet Chew 1 tablet by mouth daily.   cholestyramine light (PREVALITE) 4 g packet Take 4 g by mouth 2 (two) times daily.   conjugated estrogens (PREMARIN) vaginal cream Place 1 applicator vaginally as needed.   ferrous sulfate  325 (65 FE) MG tablet Take 1 tablet (325 mg total) by mouth daily with breakfast.   FLUoxetine  (PROZAC ) 20 MG tablet Take 1 tablet (  20 mg total) by mouth daily.   fluticasone  (FLONASE ) 50 MCG/ACT nasal spray Place 2 sprays into both nostrils daily.   furosemide  (LASIX ) 20 MG tablet Take 1 tablet (20 mg total) by mouth daily.   levothyroxine  (SYNTHROID ) 88 MCG tablet Take 1 tablet (88 mcg total) by mouth daily before breakfast.   metoprolol  succinate (TOPROL -XL) 25 MG 24 hr tablet Take 1 tablet (25 mg total) by mouth 2 (two) times daily.   Netarsudil  Dimesylate (RHOPRESSA ) 0.02 %  SOLN Place 1 drop into both eyes at bedtime.   nitrofurantoin , macrocrystal-monohydrate, (MACROBID ) 100 MG capsule Take 1 capsule (100 mg total) by mouth 2 (two) times daily.   pantoprazole  (PROTONIX ) 40 MG tablet Take 1 tablet (40 mg total) by mouth daily.   simvastatin  (ZOCOR ) 5 MG tablet Take 1 tablet (5 mg total) by mouth at bedtime.   VYZULTA  0.024 % SOLN Place 1 drop into both eyes at bedtime.    No facility-administered encounter medications on file as of 03/17/2024.    Allergies (verified) Contrast media [iodinated contrast media], Iodine, Sulfasalazine, Dye fdc red [red dye #40 (allura red)], Lactose intolerance (gi), Penicillins, Shellfish allergy, Sulfa antibiotics, Venlafaxine, Codeine, and Ropinirole   History: Past Medical History:  Diagnosis Date   Arthritis    spine   Cervicalgia    Chronic cystitis    COPD (chronic obstructive pulmonary disease) (HCC)    Deafness in left ear    s/p skull fracture - age 68   Depression    GERD (gastroesophageal reflux disease)    Headache    daily. 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   Hyperlipidemia    Hypertension    Hypothyroidism    Mixed incontinence urge and stress    Vertigo    Wears hearing aid    right ear   Past Surgical History:  Procedure Laterality Date   ABDOMINAL HYSTERECTOMY     CATARACT EXTRACTION W/ INTRAOCULAR LENS IMPLANT     CHOLECYSTECTOMY     COLONOSCOPY     ESOPHAGOGASTRODUODENOSCOPY  04/21/2022   [place]: mildly torturous esophagus, 7 cm hiatal hernia, oetherwise normal stomach and duodenum   ESOPHAGOGASTRODUODENOSCOPY (EGD) WITH PROPOFOL  N/A 05/12/2021   non-obstructing Schatzki ring s/p dilation, 6 cm hiatal hernia, normal stomach, normal duodenum   ESOPHAGOGASTRODUODENOSCOPY (EGD) WITH PROPOFOL  N/A 05/05/2022   Procedure: ESOPHAGOGASTRODUODENOSCOPY (EGD) WITH PROPOFOL ;  Surgeon: Urban Garden, MD;  [facility];  Service: Gastroenterology;  Laterality: N/A;   EYE SURGERY Bilateral    eye shunts   FOOT SURGERY     GIVENS CAPSULE STUDY N/A 05/05/2022   Procedure: GIVENS CAPSULE STUDY;  Surgeon: Urban Garden, MD;  Location: [facility];  Service: Gastroenterology;  Laterality: N/A;   GLAUCOMA SURGERY     PHOTOCOAGULATION WITH LASER Left 09/07/2016   Procedure: PHOTOCOAGULATION WITH LASER;  Surgeon: Billee Buddle, MD;  Location[facility];  Service: Ophthalmology;  Laterality: Left;  LEFT   TONSILLECTOMY     Family History  Problem Relation Age of Onset   Heart disease Mother    Hypertension Mother    Heart failure Mother    Kidney disease Father    Stroke Father    Colon polyps Neg Hx    Colon cancer Neg Hx    Social History   Socioeconomic History   Marital status: Single  Spouse name: Not on file   Number of children: Not on file   Years of education: Not on file   Highest education level: Not on file  Occupational History   Not on file  Tobacco Use   Smoking status: Never   Smokeless tobacco: Never  Vaping Use   Vaping status: Never Used  Substance and Sexual Activity   Alcohol use: No   Drug use: No   Sexual activity: Not Currently    Birth control/protection: Surgical, Post-menopausal  Other Topics Concern   Not on file  Social History Narrative   Not on file   Social Drivers of Health   Financial Resource Strain: Low Risk  (03/17/2024)   Overall Financial Resource Strain (CARDIA)    Difficulty of Paying Living Expenses: Not hard at all  Food Insecurity: No Food Insecurity (03/17/2024)   Hunger Vital Sign    Worried About Running Out of Food in the Last Year: Never true    Ran Out of Food in the Last Year: Never true  Transportation Needs: No Transportation Needs (03/17/2024)   PRAPARE - Administrator, Civil Service (Medical): No    Lack of Transportation (Non-Medical): No  Physical Activity: Insufficiently  Active (03/17/2024)   Exercise Vital Sign    Days of Exercise per Week: 3 days    Minutes of Exercise per Session: 30 min  Stress: No Stress Concern Present (03/17/2024)   [employer] of Occupational Health - Occupational Stress Questionnaire    Feeling of Stress : Not at all  Social Connections: Unknown (03/17/2024)   Social Connection and Isolation Panel [NHANES]    Frequency of Communication with Friends and Family: More than three times a week    Frequency of Social Gatherings with Friends and Family: Twice a week    Attends Religious Services: More than 4 times per year    Active Member of [employer] or Organizations: No    Attends Engineer, structural: Never    Marital Status: Patient declined    Tobacco Counseling Counseling given: Not Answered    Clinical Intake:  Pre-visit preparation completed: Yes  Pain : No/denies pain     BMI - recorded: 27.44 Nutritional Status: BMI 25 -29 Overweight Nutritional Risks: None Diabetes: No  Lab Results  Component Value Date   HGBA1C 5.8 (H) 03/17/2021     How often do you need to have someone help you when you read instructions, pamphlets, or other written materials from your doctor or pharmacy?: 1 - Never  Interpreter Needed?: No  Information entered by :: Juliann Ochoa   Activities of Daily Living     03/17/2024    9:11 AM 12/21/2023    3:09 PM  In your present state of health, do you have any difficulty performing the following activities:  Hearing? 1 0  Comment yes and have hearing aids   Vision? 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 0 0  Walking or climbing stairs? 0 1  Dressing or bathing? 0 1  Doing errands, shopping? 1 1  Comment children will drive her   Preparing Food and eating ? Y   Comment children will help   Using the Toilet? N   In the past six months, have you accidently leaked urine? Y   Comment when patient had UTI.   Do you have  problems with loss of bowel control? Y   Managing your Medications? N  Managing your Finances? N   Housekeeping or managing your Housekeeping? N     Patient Care Team: Rockney Cid, DO as PCP - General (Internal Medicine)  Indicate any recent Medical Services you may have received from other than [facility] providers in the past year (date may be approximate).     Assessment:   This is a routine wellness examination for Johnelle.  Hearing/Vision screen Hearing Screening - Comments:: Patient has some difficulties hearing and wears hearing aids Vision Screening - Comments:: Can't see out of her right eye   Goals Addressed               This Visit's Progress     Patient Stated (pt-stated)        Patient would like to stay healthy       Depression Screen     03/17/2024   11:19 AM 03/02/2024   11:45 AM 12/21/2023    3:09 PM 09/16/2023   11:11 AM  PHQ 2/9 Scores  PHQ - 2 Score 0 0 0 1  PHQ- 9 Score 0 0  1    Fall Risk     03/17/2024    9:09 AM 03/02/2024   11:44 AM 12/21/2023    3:09 PM 09/16/2023   11:11 AM  Fall Risk   Falls in the past year? 0 0 0 0  Number falls in past yr: 0 0 0 0  Injury with Fall? 0 0 0 0  Risk for fall due to : No Fall Risks  No Fall Risks   Follow up Falls prevention discussed;Falls evaluation completed Falls evaluation completed Falls evaluation completed     MEDICARE RISK AT HOME:  Medicare Risk at Home Any stairs in or around the home?: Yes If so, are there any without handrails?: No Home free of loose throw rugs in walkways, pet beds, electrical cords, etc?: Yes Adequate lighting in your home to reduce risk of falls?: Yes Life alert?: Yes Use of a cane, walker or w/c?: Yes (walker) Grab bars in the bathroom?: Yes Shower chair or bench in shower?: Yes Elevated toilet seat or a handicapped toilet?: Yes  TIMED UP AND GO:  Was the test performed?  No  Cognitive Function: 6CIT completed        03/17/2024    9:08 AM  6CIT Screen   What Year? 0 points  What month? 0 points  What time? 0 points  Count back from 20 0 points  Months in reverse 0 points  Repeat phrase 0 points  Total Score 0 points    Immunizations Immunization History  Administered Date(s) Administered   Influenza, High Dose Seasonal PF 08/29/2018, 09/04/2019   Influenza-Unspecified 09/02/2016, 08/24/2023   PNEUMOCOCCAL CONJUGATE-20 09/16/2023   Pneumococcal Polysaccharide-23 10/29/2008   Td (Adult),unspecified 12/30/2005   Zoster Recombinant(Shingrix) 10/23/2022    Screening Tests Health Maintenance  Topic Date Due   Zoster Vaccines- Shingrix (2 of 2) 12/18/2022   COVID-19 Vaccine (1 - 2024-25 season) Never done   DTaP/Tdap/Td (1 - Tdap) 09/15/2024 (Originally 12/31/2005)   INFLUENZA VACCINE  06/23/2024   Medicare Annual Wellness (AWV)  03/17/2025   Pneumonia Vaccine 86+ Years old  Completed   DEXA SCAN  Completed   HPV VACCINES  Aged Out   Meningococcal B Vaccine  Aged Out    Health Maintenance  Health Maintenance Due  Topic Date Due   Zoster Vaccines- Shingrix (2 of 2) 12/18/2022   COVID-19 Vaccine (1 - 2024-25 season) Never done  Health Maintenance Items Addressed: declined getting the vaccines at her age   Additional Screening:  Vision Screening: Recommended annual ophthalmology exams for early detection of glaucoma and other disorders of the eye.  Dental Screening: Recommended annual dental exams for proper oral hygiene  Community Resource Referral / Chronic Care Management: CRR required this visit?  No   CCM required this visit?  No     Plan:     I have personally reviewed and noted the following in the patient's chart:   Medical and social history Use of alcohol, tobacco or illicit drugs  Current medications and supplements including opioid prescriptions. Patient is not currently taking opioid prescriptions. Functional ability and status Nutritional status Physical activity Advanced directives List of  other physicians Hospitalizations, surgeries, and ER visits in previous 12 months Vitals Screenings to include cognitive, depression, and falls Referrals and appointments  In addition, I have reviewed and discussed with patient certain preventive protocols, quality metrics, and best practice recommendations. A written personalized care plan for preventive services as well as general preventive health recommendations were provided to patient.     Freeda Jerry, [place]   03/17/2024   After Visit Summary: (MyChart) Due to this being a telephonic visit, the after visit summary with patients personalized plan was offered to patient via MyChart   Notes: Nothing significant to report at this time.

## 2024-03-17 NOTE — Patient Instructions (Signed)
 Ms. Pamela Reed , Thank you for taking time to come for your Medicare Wellness Visit. I appreciate your ongoing commitment to your health goals. Please review the following plan we discussed and let me know if I can assist you in the future.   Referrals/Orders/Follow-Ups/Clinician Recommendations: follow up as scheduled for next annual wellness visit.  This is a list of the screening recommended for you and due dates:  Health Maintenance  Topic Date Due   Zoster (Shingles) Vaccine (2 of 2) 12/18/2022   COVID-19 Vaccine (1 - 2024-25 season) Never done   DTaP/Tdap/Td vaccine (1 - Tdap) 09/15/2024*   Flu Shot  06/23/2024   Medicare Annual Wellness Visit  03/17/2025   Pneumonia Vaccine  Completed   DEXA scan (bone density measurement)  Completed   HPV Vaccine  Aged Out   Meningitis B Vaccine  Aged Out  *Topic was postponed. The date shown is not the original due date.    Advanced directives: (Declined) Advance directive discussed with you today. Even though you declined this today, please call our office should you change your mind, and we can give you the proper paperwork for you to fill out.  Next Medicare Annual Wellness Visit scheduled for next year: Yes

## 2024-03-20 ENCOUNTER — Ambulatory Visit: Payer: Self-pay | Admitting: Internal Medicine

## 2024-05-08 ENCOUNTER — Other Ambulatory Visit: Payer: Self-pay | Admitting: Internal Medicine

## 2024-05-08 DIAGNOSIS — I10 Essential (primary) hypertension: Secondary | ICD-10-CM

## 2024-05-09 ENCOUNTER — Telehealth: Payer: Self-pay | Admitting: Internal Medicine

## 2024-05-09 ENCOUNTER — Other Ambulatory Visit: Payer: Self-pay | Admitting: Emergency Medicine

## 2024-05-09 DIAGNOSIS — I10 Essential (primary) hypertension: Secondary | ICD-10-CM

## 2024-05-09 MED ORDER — FUROSEMIDE 20 MG PO TABS
20.0000 mg | ORAL_TABLET | Freq: Every day | ORAL | 0 refills | Status: DC
Start: 2024-05-09 — End: 2024-07-11

## 2024-05-09 MED ORDER — AMLODIPINE BESYLATE 10 MG PO TABS: 10.0000 mg | ORAL_TABLET | Freq: Every day | ORAL | 1 refills | Status: DC

## 2024-05-09 NOTE — Telephone Encounter (Signed)
amLODipine (NORVASC) 10 MG tablet  

## 2024-05-09 NOTE — Telephone Encounter (Signed)
Order sent for refill

## 2024-05-09 NOTE — Telephone Encounter (Signed)
furosemide (LASIX) 20 MG tablet

## 2024-05-10 NOTE — Telephone Encounter (Signed)
 Duplicate, refilled 05/09/24.  Requested Prescriptions  Pending Prescriptions Disp Refills   amLODipine  (NORVASC ) 10 MG tablet [Pharmacy Med Name: amLODIPine  BESYLATE 10MG  TAB] 90 tablet 1    Sig: TAKE 1 TABLET BY MOUTH DAILY     Cardiovascular: Calcium Channel Blockers 2 Failed - 05/10/2024 10:52 AM      Failed - Valid encounter within last 6 months    Recent Outpatient Visits           2 months ago Vaginal discomfort   [facility] Rockney Cid, DO   2 months ago Painful urination   [facility] Quinton Buckler, FNP   3 months ago Upper respiratory tract infection, unspecified type   [facility] Adeline Hone, PA-C              Passed - Last BP in normal range    BP Readings from Last 1 Encounters:  03/17/24 130/76         Passed - Last Heart Rate in normal range    Pulse Readings from Last 1 Encounters:  03/06/24 70          furosemide  (LASIX ) 20 MG tablet [Pharmacy Med Name: FUROSEMIDE  20 MG TABLET] 90 tablet 1    Sig: TAKE 1 TABLET BY MOUTH DAILY     Cardiovascular:  Diuretics - Loop Failed - 05/10/2024 10:52 AM      Failed - K in normal range and within 180 days    Potassium  Date Value Ref Range Status  06/17/2023 3.7 3.5 - 5.1 mmol/L Final         Failed - Ca in normal range and within 180 days    Calcium  Date Value Ref Range Status  06/17/2023 9.2 8.9 - 10.3 mg/dL Final         Failed - Na in normal range and within 180 days    Sodium  Date Value Ref Range Status  06/17/2023 138 135 - 145 mmol/L Final         Failed - Cr in normal range and within 180 days    Creatinine, Ser  Date Value Ref Range Status  06/17/2023 1.12 (H) 0.44 - 1.00 mg/dL Final         Failed - Cl in normal range and within 180 days    Chloride  Date Value Ref Range Status  06/17/2023 104 98 - 111 mmol/L Final         Failed - Mg Level in normal range and within 180 days    Magnesium    Date Value Ref Range Status  05/08/2022 2.1 1.7 - 2.4 mg/dL Final    Comment:    Performed at [facility], [address] [place], [place] [address]         Failed - Valid encounter within last 6 months    Recent Outpatient Visits           2 months ago Vaginal discomfort   [facility] Rockney Cid, DO   2 months ago Painful urination   [facility] Quinton Buckler, FNP   3 months ago Upper respiratory tract infection, unspecified type   [facility] Adeline Hone, PA-C              Passed - Last BP in normal range    BP Readings from Last 1 Encounters:  03/17/24 130/76

## 2024-05-20 ENCOUNTER — Other Ambulatory Visit: Payer: Self-pay | Admitting: Internal Medicine

## 2024-05-20 DIAGNOSIS — I10 Essential (primary) hypertension: Secondary | ICD-10-CM

## 2024-05-22 ENCOUNTER — Other Ambulatory Visit: Payer: Self-pay | Admitting: Internal Medicine

## 2024-05-22 DIAGNOSIS — I10 Essential (primary) hypertension: Secondary | ICD-10-CM

## 2024-05-22 NOTE — Telephone Encounter (Signed)
 Requested Prescriptions  Pending Prescriptions Disp Refills   metoprolol  succinate (TOPROL -XL) 25 MG 24 hr tablet [Pharmacy Med Name: METOPROLOL  SUCC ER 25 MG TAB] 180 tablet 0    Sig: TAKE 1 TABLET BY MOUTH 2 TIMES A DAY     Cardiovascular:  Beta Blockers Failed - 05/22/2024  5:59 PM      Failed - Valid encounter within last 6 months    Recent Outpatient Visits           2 months ago Vaginal discomfort   [facility] Bernardo Fend, DO   2 months ago Painful urination   [facility] Gareth Clarity F, FNP   3 months ago Upper respiratory tract infection, unspecified type   [facility] Leavy Mole, PA-C              Passed - Last BP in normal range    BP Readings from Last 1 Encounters:  03/17/24 130/76         Passed - Last Heart Rate in normal range    Pulse Readings from Last 1 Encounters:  03/06/24 70

## 2024-05-22 NOTE — Telephone Encounter (Unsigned)
 Copied from CRM [id number]. Topic: Clinical - Medication Refill >> May 22, 2024  4:14 PM Santiya F wrote: Medication: metoprolol  succinate (TOPROL -XL) 25 MG 24 hr tablet [[id number]]  Has the patient contacted their pharmacy? Yes  (Agent: If yes, when and what did the pharmacy advise?) call office, no refills   This is the patient's preferred pharmacy:  [facility] [id number] [place], [place] - [address] [place] [address] Phone: [phone] Fax: [phone]  Is this the correct pharmacy for this prescription? Yes If no, delete pharmacy and type the correct one.   Has the prescription been filled recently? Yes  Is the patient out of the medication? No  Has the patient been seen for an appointment in the last year OR does the patient have an upcoming appointment? Yes  Can we respond through MyChart? No  Agent: Please be advised that Rx refills may take up to 3 business days. We ask that you follow-up with your pharmacy.

## 2024-05-24 NOTE — Telephone Encounter (Signed)
 Refused Metoprolol  25 mg because this is a duplicate request.

## 2024-06-08 ENCOUNTER — Ambulatory Visit (INDEPENDENT_AMBULATORY_CARE_PROVIDER_SITE_OTHER): Admitting: Internal Medicine

## 2024-06-08 ENCOUNTER — Encounter: Payer: Self-pay | Admitting: Internal Medicine

## 2024-06-08 ENCOUNTER — Other Ambulatory Visit: Payer: Self-pay

## 2024-06-08 VITALS — BP 122/84 | HR 76 | Resp 14 | Ht 62.0 in | Wt 157.6 lb

## 2024-06-08 DIAGNOSIS — E782 Mixed hyperlipidemia: Secondary | ICD-10-CM

## 2024-06-08 DIAGNOSIS — K909 Intestinal malabsorption, unspecified: Secondary | ICD-10-CM

## 2024-06-08 DIAGNOSIS — R351 Nocturia: Secondary | ICD-10-CM

## 2024-06-08 DIAGNOSIS — E039 Hypothyroidism, unspecified: Secondary | ICD-10-CM | POA: Diagnosis not present

## 2024-06-08 DIAGNOSIS — R197 Diarrhea, unspecified: Secondary | ICD-10-CM

## 2024-06-08 DIAGNOSIS — K219 Gastro-esophageal reflux disease without esophagitis: Secondary | ICD-10-CM | POA: Diagnosis not present

## 2024-06-08 DIAGNOSIS — I10 Essential (primary) hypertension: Secondary | ICD-10-CM

## 2024-06-08 LAB — COMPREHENSIVE METABOLIC PANEL WITH GFR
AG Ratio: 1.8 (calc) (ref 1.0–2.5)
ALT: 10 U/L (ref 6–29)
AST: 15 U/L (ref 10–35)
Albumin: 3.9 g/dL (ref 3.6–5.1)
Alkaline phosphatase (APISO): 78 U/L (ref 37–153)
BUN/Creatinine Ratio: 14 (calc) (ref 6–22)
BUN: 14 mg/dL (ref 7–25)
CO2: 22 mmol/L (ref 20–32)
Calcium: 8.9 mg/dL (ref 8.6–10.4)
Chloride: 105 mmol/L (ref 98–110)
Creat: 0.97 mg/dL — ABNORMAL HIGH (ref 0.60–0.95)
Globulin: 2.2 g/dL (ref 1.9–3.7)
Glucose, Bld: 121 mg/dL — ABNORMAL HIGH (ref 65–99)
Potassium: 4.4 mmol/L (ref 3.5–5.3)
Sodium: 136 mmol/L (ref 135–146)
Total Bilirubin: 0.5 mg/dL (ref 0.2–1.2)
Total Protein: 6.1 g/dL (ref 6.1–8.1)
eGFR: 56 mL/min/1.73m2 — ABNORMAL LOW (ref >=60)

## 2024-06-08 LAB — CBC WITH DIFFERENTIAL/PLATELET
Absolute Lymphocytes: 3013 {cells}/uL (ref 850–3900)
Absolute Monocytes: 647 {cells}/uL (ref 200–950)
Basophils Absolute: 91 {cells}/uL (ref 0–200)
Basophils Relative: 1.1 %
Eosinophils Absolute: 382 {cells}/uL (ref 15–500)
Eosinophils Relative: 4.6 %
HCT: 34.1 % — ABNORMAL LOW (ref 35.0–45.0)
Hemoglobin: 10.7 g/dL — ABNORMAL LOW (ref 11.7–15.5)
MCH: 28 pg (ref 27.0–33.0)
MCHC: 31.4 g/dL — ABNORMAL LOW (ref 32.0–36.0)
MCV: 89.3 fL (ref 80.0–100.0)
MPV: 11 fL (ref 7.5–12.5)
Monocytes Relative: 7.8 %
Neutro Abs: 4167 {cells}/uL (ref 1500–7800)
Neutrophils Relative %: 50.2 %
Platelets: 295 Thousand/uL (ref 140–400)
RBC: 3.82 Million/uL (ref 3.80–5.10)
RDW: 14.7 % (ref 11.0–15.0)
Total Lymphocyte: 36.3 %
WBC: 8.3 Thousand/uL (ref 3.8–10.8)

## 2024-06-08 LAB — TSH: TSH: 6.96 m[IU]/L — ABNORMAL HIGH (ref 0.40–4.50)

## 2024-06-08 MED ORDER — SIMVASTATIN 5 MG PO TABS: 5.0000 mg | ORAL_TABLET | Freq: Every day | ORAL | 1 refills | Status: DC

## 2024-06-08 MED ORDER — PANTOPRAZOLE SODIUM 40 MG PO TBEC: 40.0000 mg | DELAYED_RELEASE_TABLET | Freq: Every day | ORAL | 1 refills | Status: DC

## 2024-06-08 NOTE — Progress Notes (Signed)
 Established Patient Office Visit  Subjective   Patient ID: Pamela Reed, female    DOB: 01/23/1931  Age: 89 y.o. MRN: [id number]  Chief Complaint  Patient presents with   Follow-up    HPI  Pamela Reed presents for recheck.  She is here with her daughter.  Discussed the use of AI scribe software for clinical note transcription with the patient, who gave verbal consent to proceed.  History of Present Illness Pamela Reed is a 89 year old female with pancreatic enzyme insufficiency who presents for a routine follow-up and medication refills.  She experiences diarrhea and loose, floating stools if she does not take her pancreatic enzyme replacement medication with meals. Nausea and stomach discomfort occur after eating, regardless of the food type, including low-fat options like potato soup. She sometimes consumes high-fat foods such as creamy soups and ice cream.  She is on Zocor  for hypercholesterolemia and pantoprazole  for acid reflux. She has a history of blood clots but is no longer on anticoagulation therapy. She has experienced difficulty with blood draws in the past.  She experiences nocturia, waking up at night with an urgent need to urinate, and uses two incontinence pads per night. She attributes this to late evening fluid intake and has discontinued Lasix .  She resides at [facility] and finds the food options limited for her dietary needs, particularly low-fat and non-spicy options. She experiences nausea after eating various foods, including those perceived as low-fat.     Hypertension: -Medications: Amlodipine  10 mg, Metoprolol  25 XL mg BID -Lasix  recently discontinued due to urinary incontinence at night  -Patient is compliant with above medications and reports no side effects. -Had been on Lisinopril  but discontinued - not sure why -Checking BP at home (average): Not checking  -Denies any SOB, CP, vision changes, LE edema or symptoms of  hypotension  HLD/Hx of DVT in left lower extremity: -Medications: Zocor  5 mg -Patient is compliant with above medications and reports no side effects.  -Last lipid panel: uncertain -History of LLE DVT after she was hospitalized for diverticulitis, was treated for 3 months with anticoagulation, no other clots since  COPD/Asthma : -COPD status: stable -Current medications: Albuterol  PRN -Satisfied with current treatment?: yes -Oxygen use: no -Dyspnea frequency: none -Cough frequency: none -Rescue inhaler frequency:  occasional  -Productive cough: no -Pneumovax: UTD -Influenza: Up to Date  Hypothyroidism: -Medications: Levothyroxine  88 mcg -Patient is compliant with the above medication (s) at the above dose and reports no medication side effects.  -Denies weight changes, cold./heat intolerance, skin changes, anxiety/palpitations  -Last TSH: 5.322 5/23  GERD/hiatal hernia: -Currently on Protonix  40 mg -Recently had a CT scan 9/24 with hiatal hernia increasing in size, colonic diverticulosis  -Following with GI at [facility] for diarrhea - now on Cholestyramine 4 gm with meals, has diarrhea if she does not take  MDD/Anxiety: -Currently on Prozac  20 mg, doing well   Glaucoma:  -On eye drops, following with Ophthalmology   Health Maintenance: -Blood work due -Thinks she had shingles and RSV vaccines will bring records   Patient Active Problem List   Diagnosis Date Noted   Mixed hyperlipidemia 12/21/2023   History of DVT of lower extremity 09/16/2023   Gastroesophageal reflux disease 09/16/2023   Anemia    Iron deficiency anemia 05/07/2022   Heme positive stool 05/06/2022   Thrombocytosis 05/05/2022   Diverticulitis 04/11/2022   Physical deconditioning 04/11/2022   Known medical problems 04/11/2022   Hyponatremia 03/17/2021   Hypokalemia  03/17/2021   Dysphagia 03/17/2021   Essential hypertension 03/17/2021   Hypothyroidism 03/17/2021   Anxiety 03/17/2021   Depression  03/17/2021   Glaucoma 03/17/2021   RLS (restless legs syndrome) 01/01/2017   B12 deficiency 11/18/2016   Chronic cystitis 12/17/2012   Mixed urge and stress incontinence 12/17/2012   Past Medical History:  Diagnosis Date   Arthritis    spine   Cervicalgia    Chronic cystitis    COPD (chronic obstructive pulmonary disease) (HCC)    Deafness in left ear    s/p skull fracture - age 46   Depression    GERD (gastroesophageal reflux disease)    Headache    daily. 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   Hyperlipidemia    Hypertension    Hypothyroidism    Mixed incontinence urge and stress    Vertigo    Wears hearing aid    right ear   Past Surgical History:  Procedure Laterality Date   ABDOMINAL HYSTERECTOMY     CATARACT EXTRACTION W/ INTRAOCULAR LENS IMPLANT     CHOLECYSTECTOMY     COLONOSCOPY     ESOPHAGOGASTRODUODENOSCOPY  04/21/2022   [place]: mildly torturous esophagus, 7 cm hiatal hernia, oetherwise normal stomach and duodenum   ESOPHAGOGASTRODUODENOSCOPY (EGD) WITH PROPOFOL  N/A 05/12/2021   non-obstructing Schatzki ring s/p dilation, 6 cm hiatal hernia, normal stomach, normal duodenum   ESOPHAGOGASTRODUODENOSCOPY (EGD) WITH PROPOFOL  N/A 05/05/2022   Procedure: ESOPHAGOGASTRODUODENOSCOPY (EGD) WITH PROPOFOL ;  Surgeon: Eartha Angelia Sieving, MD;  [facility];  Service: Gastroenterology;  Laterality: N/A;   EYE SURGERY Bilateral    eye shunts   FOOT SURGERY     GIVENS CAPSULE STUDY N/A 05/05/2022   Procedure: GIVENS CAPSULE STUDY;  Surgeon: Eartha Angelia Sieving, MD;  [facility]: [facility];  Service: Gastroenterology;  Laterality: N/A;   GLAUCOMA SURGERY     PHOTOCOAGULATION WITH LASER Left 09/07/2016   Procedure: PHOTOCOAGULATION WITH LASER;  Surgeon: Donzell Arlyce Budd, MD;  Location[facility];  Service: Ophthalmology;  Laterality: Left;  LEFT   TONSILLECTOMY      Social History   Tobacco Use   Smoking status: Never   Smokeless tobacco: Never  Vaping Use   Vaping status: Never Used  Substance Use Topics   Alcohol use: No   Drug use: No   Social History   Socioeconomic History   Marital status: Single    Spouse name: Not on file   Number of children: Not on file   Years of education: Not on file   Highest education level: Not on file  Occupational History   Not on file  Tobacco Use   Smoking status: Never   Smokeless tobacco: Never  Vaping Use   Vaping status: Never Used  Substance and Sexual Activity   Alcohol use: No   Drug use: No   Sexual activity: Not Currently    Birth control/protection: Surgical, Post-menopausal  Other Topics Concern   Not on file  Social History Narrative   Not on file   Social Drivers of Health   Financial Resource Strain: Low Risk  (03/17/2024)   Overall Financial Resource Strain (CARDIA)    Difficulty of Paying Living Expenses: Not hard at all  Food Insecurity: No Food Insecurity (03/17/2024)   Hunger Vital Sign    Worried About Running Out of Food in the  Last Year: Never true    Ran Out of Food in the Last Year: Never true  Transportation Needs: No Transportation Needs (03/17/2024)   PRAPARE - Administrator, Civil Service (Medical): No    Lack of Transportation (Non-Medical): No  Physical Activity: Insufficiently Active (03/17/2024)   Exercise Vital Sign    Days of Exercise per Week: 3 days    Minutes of Exercise per Session: 30 min  Stress: No Stress Concern Present (03/17/2024)   [employer] of Occupational Health - Occupational Stress Questionnaire    Feeling of Stress : Not at all  Social Connections: Unknown (03/17/2024)   Social Connection and Isolation Panel    Frequency of Communication with Friends and Family: More than three times a week    Frequency of Social Gatherings with Friends and Family: Twice a week    Attends Religious Services: More than 4 times per  year    Active Member of [employer] or Organizations: No    Attends Banker Meetings: Never    Marital Status: Patient declined  Catering manager Violence: Not At Risk (03/17/2024)   Humiliation, Afraid, Rape, and Kick questionnaire    Fear of Current or Ex-Partner: No    Emotionally Abused: No    Physically Abused: No    Sexually Abused: No   Family Status  Relation Name Status   Mother  Alive   Father  Alive   Neg Hx  (Not Specified)  No partnership data on file   Family History  Problem Relation Age of Onset   Heart disease Mother    Hypertension Mother    Heart failure Mother    Kidney disease Father    Stroke Father    Colon polyps Neg Hx    Colon cancer Neg Hx    Allergies  Allergen Reactions   Contrast Media [Iodinated Contrast Media] Shortness Of Breath   Iodine Shortness Of Breath   Sulfasalazine Itching and Swelling   Dye Fdc Red [Red Dye #40 (Allura Red)]    Lactose Intolerance (Gi)     GI upset   Penicillins     Childhood event-unknown reaction   Shellfish Allergy Nausea Only and Swelling    Throat swelling   Sulfa Antibiotics Itching and Swelling   Venlafaxine Swelling    Throat   Codeine Palpitations   Ropinirole Nausea Only      Review of Systems  Gastrointestinal:  Positive for diarrhea.  All other systems reviewed and are negative.     Objective:     BP 122/84 (Cuff Size: Normal)   Pulse 76   Resp 14   Ht 5' 2 (1.575 m)   Wt 157 lb 9.6 oz (71.5 kg)   SpO2 94%   BMI 28.83 kg/m  BP Readings from Last 3 Encounters:  03/17/24 130/76  03/06/24 130/76  03/02/24 122/78   Wt Readings from Last 3 Encounters:  03/17/24 150 lb (68 kg)  03/06/24 154 lb 6.4 oz (70 kg)  03/02/24 154 lb 11.2 oz (70.2 kg)      Physical Exam Constitutional:      Appearance: Normal appearance.  HENT:     Head: Normocephalic and atraumatic.  Eyes:     Conjunctiva/sclera: Conjunctivae normal.  Cardiovascular:     Rate and Rhythm: Normal rate  and regular rhythm.  Pulmonary:     Effort: Pulmonary effort is normal.     Breath sounds: Normal breath sounds.  Musculoskeletal:     Right  lower leg: No edema.     Left lower leg: No edema.  Skin:    General: Skin is warm and dry.  Neurological:     General: No focal deficit present.     Mental Status: She is alert. Mental status is at baseline.  Psychiatric:        Mood and Affect: Mood normal.        Behavior: Behavior normal.      No results found for any visits on 06/08/24.   Last CBC Lab Results  Component Value Date   WBC 8.7 06/17/2023   HGB 11.7 (L) 06/17/2023   HCT 36.9 06/17/2023   MCV 87.0 06/17/2023   MCH 27.6 06/17/2023   RDW 15.3 06/17/2023   PLT 358 06/17/2023   Last metabolic panel Lab Results  Component Value Date   GLUCOSE 140 (H) 06/17/2023   NA 138 06/17/2023   K 3.7 06/17/2023   CL 104 06/17/2023   CO2 24 06/17/2023   BUN 15 06/17/2023   CREATININE 1.12 (H) 06/17/2023   GFRNONAA 47 (L) 06/17/2023   CALCIUM 9.2 06/17/2023   PROT 7.7 06/17/2023   ALBUMIN 3.9 06/17/2023   BILITOT 0.4 06/17/2023   ALKPHOS 92 06/17/2023   AST 19 06/17/2023   ALT 11 06/17/2023   ANIONGAP 10 06/17/2023   Last lipids No results found for: CHOL, HDL, LDLCALC, LDLDIRECT, TRIG, CHOLHDL Last hemoglobin A1c Lab Results  Component Value Date   HGBA1C 5.8 (H) 03/17/2021   Last thyroid  functions Lab Results  Component Value Date   TSH 1.063 03/17/2021   Last vitamin D No results found for: 25OHVITD2, 25OHVITD3, VD25OH Last vitamin B12 and Folate Lab Results  Component Value Date   VITAMINB12 513 05/05/2022   FOLATE 8.2 05/05/2022      The ASCVD Risk score (Arnett DK, et al., 2019) failed to calculate for the following reasons:   The 2019 ASCVD risk score is only valid for ages 64 to 72    Assessment & Plan:   Assessment & Plan Diarrhea due to Malabsorption  Insufficient pancreatic enzyme production causing diarrhea and nausea  with fatty foods. Symptoms alleviated with enzyme replacement. - Continue pancreatic enzyme replacement therapy with meals. - Advise on dietary modifications to reduce fat intake.  Nocturia and Urinary Incontinence Nocturia likely due to fluid intake before bedtime. Stress incontinence occurs with sneezing. Recommended pelvic floor exercises. - Discontinue Lasix . - Advise to limit fluid intake three hours before bedtime. - Educate on Kegel exercises. - Provide printed information on Kegel exercises.  Hypertension Blood pressure stable here today, no changes made to medications and appropriate refills sent to pharmacy.   Hyperlipidemia On Zocor . Cholesterol levels not urgently needed. - Continue Zocor .  GERD Symptoms stable, refill PPI.  General Health Maintenance Received pneumonia and flu vaccines. Unsure about RSV vaccine status. Declined COVID-19 vaccine. Emphasized RSV vaccine importance. - Verify RSV vaccine status and recommend if not done. - Continue routine vaccinations as needed.  Follow-up Routine blood work planned to monitor thyroid , kidney, and liver function due to medication regimen. - Order blood work for thyroid , kidney, and liver function.  - CBC w/Diff/Platelet - Comprehensive Metabolic Panel (CMET) - simvastatin  (ZOCOR ) 5 MG tablet; Take 1 tablet (5 mg total) by mouth at bedtime.  Dispense: 90 tablet; Refill: 1 - pantoprazole  (PROTONIX ) 40 MG tablet; Take 1 tablet (40 mg total) by mouth daily.  Dispense: 90 tablet; Refill: 1 - TSH   Return in about 6 months (around  12/09/2024).    Sharyle Fischer, DO

## 2024-06-08 NOTE — Patient Instructions (Signed)
Kegel Exercises  Kegel exercises can help strengthen your pelvic floor muscles. The pelvic floor is a group of muscles that support your rectum, small intestine, and bladder. In females, pelvic floor muscles also help support the uterus. These muscles help you control the flow of urine and stool (feces). Kegel exercises are painless and simple. They do not require any equipment. Your provider may suggest Kegel exercises to: Improve bladder and bowel control. Improve sexual response. Improve weak pelvic floor muscles after surgery to remove the uterus (hysterectomy) or after pregnancy, in females. Improve weak pelvic floor muscles after prostate gland removal or surgery, in males. Kegel exercises involve squeezing your pelvic floor muscles. These are the same muscles you squeeze when you try to stop the flow of urine or keep from passing gas. The exercises can be done while sitting, standing, or lying down, but it is best to vary your position. Ask your health care provider which exercises are safe for you. Do exercises exactly as told by your health care provider and adjust them as directed. Do not begin these exercises until told by your health care provider. Exercises How to do Kegel exercises: Squeeze your pelvic floor muscles tight. You should feel a tight lift in your rectal area. If you are a female, you should also feel a tightness in your vaginal area. Keep your stomach, buttocks, and legs relaxed. Hold the muscles tight for up to 10 seconds. Breathe normally. Relax your muscles for up to 10 seconds. Repeat as told by your health care provider. Repeat this exercise daily as told by your health care provider. Continue to do this exercise for at least 4-6 weeks, or for as long as told by your health care provider. You may be referred to a physical therapist who can help you learn more about how to do Kegel exercises. Depending on your condition, your health care provider may  recommend: Varying how long you squeeze your muscles. Doing several sets of exercises every day. Doing exercises for several weeks. Making Kegel exercises a part of your regular exercise routine. This information is not intended to replace advice given to you by your health care provider. Make sure you discuss any questions you have with your health care provider. Document Revised: 03/20/2021 Document Reviewed: 03/20/2021 Elsevier Patient Education  2024 Elsevier Inc.  

## 2024-06-09 ENCOUNTER — Ambulatory Visit: Payer: Self-pay | Admitting: Internal Medicine

## 2024-06-09 ENCOUNTER — Other Ambulatory Visit: Payer: Self-pay | Admitting: Internal Medicine

## 2024-06-09 DIAGNOSIS — E039 Hypothyroidism, unspecified: Secondary | ICD-10-CM

## 2024-06-09 MED ORDER — LEVOTHYROXINE SODIUM 88 MCG PO TABS: 88.0000 ug | ORAL_TABLET | Freq: Every day | ORAL | 1 refills | Status: DC

## 2024-06-23 DIAGNOSIS — Z961 Presence of intraocular lens: Secondary | ICD-10-CM | POA: Diagnosis not present

## 2024-06-23 DIAGNOSIS — H401133 Primary open-angle glaucoma, bilateral, severe stage: Secondary | ICD-10-CM | POA: Diagnosis not present

## 2024-06-23 DIAGNOSIS — H524 Presbyopia: Secondary | ICD-10-CM | POA: Diagnosis not present

## 2024-06-23 DIAGNOSIS — H5213 Myopia, bilateral: Secondary | ICD-10-CM | POA: Diagnosis not present

## 2024-06-23 DIAGNOSIS — H43813 Vitreous degeneration, bilateral: Secondary | ICD-10-CM | POA: Diagnosis not present

## 2024-07-08 ENCOUNTER — Other Ambulatory Visit: Payer: Self-pay | Admitting: Internal Medicine

## 2024-07-08 DIAGNOSIS — I10 Essential (primary) hypertension: Secondary | ICD-10-CM

## 2024-07-11 NOTE — Telephone Encounter (Signed)
 Labs in date  Requested Prescriptions  Pending Prescriptions Disp Refills   furosemide  (LASIX ) 20 MG tablet [Pharmacy Med Name: FUROSEMIDE  20 MG TABLET] 90 tablet 0    Sig: TAKE 1 TABLET BY MOUTH DAILY     Cardiovascular:  Diuretics - Loop Failed - 07/11/2024  3:07 PM      Failed - Cr in normal range and within 180 days    Creat  Date Value Ref Range Status  06/08/2024 0.97 (H) 0.60 - 0.95 mg/dL Final         Failed - Mg Level in normal range and within 180 days    Magnesium   Date Value Ref Range Status  05/08/2022 2.1 1.7 - 2.4 mg/dL Final    Comment:    Performed at [facility], [address] [place], [place] [address]         Passed - K in normal range and within 180 days    Potassium  Date Value Ref Range Status  06/08/2024 4.4 3.5 - 5.3 mmol/L Final         Passed - Ca in normal range and within 180 days    Calcium  Date Value Ref Range Status  06/08/2024 8.9 8.6 - 10.4 mg/dL Final         Passed - Na in normal range and within 180 days    Sodium  Date Value Ref Range Status  06/08/2024 136 135 - 146 mmol/L Final         Passed - Cl in normal range and within 180 days    Chloride  Date Value Ref Range Status  06/08/2024 105 98 - 110 mmol/L Final         Passed - Last BP in normal range    BP Readings from Last 1 Encounters:  06/08/24 122/84         Passed - Valid encounter within last 6 months    Recent Outpatient Visits           1 month ago Essential hypertension   [facility] Bernardo Fend, DO   4 months ago Vaginal discomfort   [facility] Bernardo Fend, DO   4 months ago Painful urination   [facility] Gareth Mliss FALCON, FNP   5 months ago Upper respiratory tract infection, unspecified type   [facility] Leavy Mole, PA-C       Future Appointments             In 5 months Bernardo Fend, DO [facility]

## 2024-08-04 ENCOUNTER — Other Ambulatory Visit: Payer: Self-pay | Admitting: Internal Medicine

## 2024-08-04 DIAGNOSIS — E039 Hypothyroidism, unspecified: Secondary | ICD-10-CM

## 2024-08-04 NOTE — Telephone Encounter (Signed)
 Too soon for refill, LRF 05/30/24 for 90 and 1 RF.  Requested Prescriptions  Pending Prescriptions Disp Refills   SYNTHROID  88 MCG tablet [Pharmacy Med Name: SYNTHROID  88 MCG TABLET] 90 tablet 1    Sig: TAKE 1 TABLET BY MOUTH DAILY BEFORE BREAKFAST     Endocrinology:  Hypothyroid Agents Failed - 08/04/2024  3:35 PM      Failed - TSH in normal range and within 360 days    TSH  Date Value Ref Range Status  06/08/2024 6.96 (H) 0.40 - 4.50 mIU/L Final         Passed - Valid encounter within last 12 months    Recent Outpatient Visits           1 month ago Essential hypertension   [facility] Bernardo Fend, DO   5 months ago Vaginal discomfort   [facility] Bernardo Fend, DO   5 months ago Painful urination   [facility] Gareth Mliss FALCON, FNP   5 months ago Upper respiratory tract infection, unspecified type   [facility] Leavy Mole, PA-C       Future Appointments             In 4 months Bernardo Fend, DO [facility], [place]

## 2024-08-05 ENCOUNTER — Other Ambulatory Visit: Payer: Self-pay | Admitting: Internal Medicine

## 2024-08-05 DIAGNOSIS — E039 Hypothyroidism, unspecified: Secondary | ICD-10-CM

## 2024-08-07 NOTE — Telephone Encounter (Signed)
 Attempted to call pharmacy- currently closed- Rx 06/09/24 #90 1RF - should be on file. Requested Prescriptions  Pending Prescriptions Disp Refills   SYNTHROID  88 MCG tablet [Pharmacy Med Name: SYNTHROID  88 MCG TABLET] 90 tablet 1    Sig: TAKE 1 TABLET BY MOUTH DAILY BEFORE BREAKFAST     Endocrinology:  Hypothyroid Agents Failed - 08/07/2024  2:21 PM      Failed - TSH in normal range and within 360 days    TSH  Date Value Ref Range Status  06/08/2024 6.96 (H) 0.40 - 4.50 mIU/L Final         Passed - Valid encounter within last 12 months    Recent Outpatient Visits           2 months ago Essential hypertension   [facility] Bernardo Fend, DO   5 months ago Vaginal discomfort   [facility] Bernardo Fend, DO   5 months ago Painful urination   [facility] Gareth Mliss FALCON, FNP   6 months ago Upper respiratory tract infection, unspecified type   [facility] Leavy Mole, PA-C       Future Appointments             In 4 months Bernardo Fend, DO [facility] [place]

## 2024-08-15 ENCOUNTER — Other Ambulatory Visit: Payer: Self-pay | Admitting: Internal Medicine

## 2024-08-15 DIAGNOSIS — I10 Essential (primary) hypertension: Secondary | ICD-10-CM

## 2024-08-15 NOTE — Telephone Encounter (Unsigned)
 Copied from CRM #[id number]. Topic: Clinical - Medication Refill >> Aug 15, 2024  9:39 AM Wess RAMAN wrote: Medication: metoprolol  succinate (TOPROL -XL) 25 MG 24 hr tablet   Has the patient contacted their pharmacy? Yes (Agent: If no, request that the patient contact the pharmacy for the refill. If patient does not wish to contact the pharmacy document the reason why and proceed with request.) (Agent: If yes, when and what did the pharmacy advise?)  This is the patient's preferred pharmacy:  [id number] [place], [place] - [address] [place] [address] Phone: [phone] Fax: [phone]  Is this the correct pharmacy for this prescription? Yes If no, delete pharmacy and type the correct one.   Has the prescription been filled recently? Yes  Is the patient out of the medication? No  Has the patient been seen for an appointment in the last year OR does the patient have an upcoming appointment? Yes  Can we respond through MyChart? Yes  Agent: Please be advised that Rx refills may take up to 3 business days. We ask that you follow-up with your pharmacy.

## 2024-08-16 MED ORDER — METOPROLOL SUCCINATE ER 25 MG PO TB24: 25.0000 mg | ORAL_TABLET | Freq: Two times a day (BID) | ORAL | 0 refills | Status: DC

## 2024-08-16 NOTE — Telephone Encounter (Signed)
 Requested Prescriptions  Pending Prescriptions Disp Refills   metoprolol  succinate (TOPROL -XL) 25 MG 24 hr tablet 180 tablet 0    Sig: Take 1 tablet (25 mg total) by mouth 2 (two) times daily.     Cardiovascular:  Beta Blockers Passed - 08/16/2024 10:15 AM      Passed - Last BP in normal range    BP Readings from Last 1 Encounters:  06/08/24 122/84         Passed - Last Heart Rate in normal range    Pulse Readings from Last 1 Encounters:  06/08/24 76         Passed - Valid encounter within last 6 months    Recent Outpatient Visits           2 months ago Essential hypertension   [facility] Fend, DO   5 months ago Vaginal discomfort   [facility] Bernardo Fend, DO   5 months ago Painful urination   [facility] Gareth Mliss FALCON, FNP   6 months ago Upper respiratory tract infection, unspecified type   [facility] Leavy Mole, PA-C       Future Appointments             In 3 months Bernardo Fend, DO [facility], [place]

## 2024-08-22 ENCOUNTER — Ambulatory Visit (INDEPENDENT_AMBULATORY_CARE_PROVIDER_SITE_OTHER): Admitting: Internal Medicine

## 2024-08-22 ENCOUNTER — Other Ambulatory Visit: Payer: Self-pay

## 2024-08-22 ENCOUNTER — Encounter: Payer: Self-pay | Admitting: Internal Medicine

## 2024-08-22 VITALS — BP 124/78 | HR 61 | Temp 97.9°F | Resp 14 | Ht 62.0 in | Wt 157.9 lb

## 2024-08-22 DIAGNOSIS — F32A Depression, unspecified: Secondary | ICD-10-CM

## 2024-08-22 DIAGNOSIS — N3946 Mixed incontinence: Secondary | ICD-10-CM | POA: Diagnosis not present

## 2024-08-22 DIAGNOSIS — F419 Anxiety disorder, unspecified: Secondary | ICD-10-CM

## 2024-08-22 DIAGNOSIS — Z23 Encounter for immunization: Secondary | ICD-10-CM

## 2024-08-22 DIAGNOSIS — R300 Dysuria: Secondary | ICD-10-CM

## 2024-08-22 DIAGNOSIS — K219 Gastro-esophageal reflux disease without esophagitis: Secondary | ICD-10-CM

## 2024-08-22 LAB — POCT URINALYSIS DIPSTICK
Bilirubin, UA: NEGATIVE
Blood, UA: NEGATIVE
Glucose, UA: NEGATIVE
Ketones, UA: NEGATIVE
Leukocytes, UA: NEGATIVE
Nitrite, UA: NEGATIVE
Protein, UA: NEGATIVE
Spec Grav, UA: 1.02 (ref 1.010–1.025)
Urobilinogen, UA: 0.2 U/dL
pH, UA: 5 (ref 5.0–8.0)

## 2024-08-22 MED ORDER — FLUOXETINE HCL 40 MG PO CAPS: 40.0000 mg | ORAL_CAPSULE | Freq: Every day | ORAL | 1 refills | Status: DC

## 2024-08-22 MED ORDER — CHOLESTYRAMINE LIGHT 4 G PO PACK: 4.0000 g | PACK | Freq: Every day | ORAL | 1 refills | Status: AC

## 2024-08-22 NOTE — Progress Notes (Signed)
 Established Patient Office Visit  Subjective   Patient ID: Pamela Reed, female    DOB: 1931-09-13  Age: 89 y.o. MRN: [id number]  Chief Complaint  Patient presents with   Medical Management of Chronic Issues    HPI  Pamela Reed presents for recheck.  She is here with her friend.  Discussed the use of AI scribe software for clinical note transcription with the patient, who gave verbal consent to proceed.  History of Present Illness Pamela Reed is a 89 year old female who presents with symptoms suggestive of a urinary tract infection and stress-related issues. She is accompanied by her niece.  She experiences dysuria and urinary frequency every two hours. She uses incontinence pads at night, which become soaked if she does not wake up in time. She has stress incontinence with coughing or sneezing.  She is under significant stress due to caring for a long-term partner with dementia, leading to increased irritability and emotional distress. She feels overwhelmed by the lack of support from her partner's family.  She takes Prozac  20 mg daily for depression and anxiety and uses medication for diarrhea once daily in the morning before eating. She has adjusted her diet to manage symptoms, avoiding foods like potato soup.    Patient Active Problem List   Diagnosis Date Noted   Mixed hyperlipidemia 12/21/2023   History of DVT of lower extremity 09/16/2023   Gastroesophageal reflux disease 09/16/2023   Anemia    Iron deficiency anemia 05/07/2022   Heme positive stool 05/06/2022   Thrombocytosis 05/05/2022   Diverticulitis 04/11/2022   Physical deconditioning 04/11/2022   Known medical problems 04/11/2022   Hyponatremia 03/17/2021   Hypokalemia 03/17/2021   Dysphagia 03/17/2021   Essential hypertension 03/17/2021   Hypothyroidism 03/17/2021   Anxiety 03/17/2021   Depression 03/17/2021   Glaucoma 03/17/2021   RLS (restless legs syndrome) 01/01/2017   B12 deficiency  11/18/2016   Chronic cystitis 12/17/2012   Mixed urge and stress incontinence 12/17/2012   Past Medical History:  Diagnosis Date   Arthritis    spine   Cervicalgia    Chronic cystitis    COPD (chronic obstructive pulmonary disease) (HCC)    Deafness in left ear    s/p skull fracture - age 68   Depression    GERD (gastroesophageal reflux disease)    Headache    daily. 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   Hyperlipidemia    Hypertension    Hypothyroidism    Mixed incontinence urge and stress    Vertigo    Wears hearing aid    right ear   Past Surgical History:  Procedure Laterality Date   ABDOMINAL HYSTERECTOMY     CATARACT EXTRACTION W/ INTRAOCULAR LENS IMPLANT     CHOLECYSTECTOMY     COLONOSCOPY     ESOPHAGOGASTRODUODENOSCOPY  04/21/2022   [place]: mildly torturous esophagus, 7 cm hiatal hernia, oetherwise normal stomach and duodenum   ESOPHAGOGASTRODUODENOSCOPY (EGD) WITH PROPOFOL  N/A 05/12/2021   non-obstructing Schatzki ring s/p dilation, 6 cm hiatal hernia, normal stomach, normal duodenum   ESOPHAGOGASTRODUODENOSCOPY (EGD) WITH PROPOFOL  N/A 05/05/2022   Procedure: ESOPHAGOGASTRODUODENOSCOPY (EGD) WITH PROPOFOL ;  Surgeon: Eartha Angelia Sieving, MD;  [facility]: [facility];  Service: Gastroenterology;  Laterality: N/A;   EYE SURGERY Bilateral    eye shunts   FOOT SURGERY  GIVENS CAPSULE STUDY N/A 05/05/2022   Procedure: GIVENS CAPSULE STUDY;  Surgeon: Eartha Angelia Sieving, MD;  Location: [facility];  Service: Gastroenterology;  Laterality: N/A;   GLAUCOMA SURGERY     PHOTOCOAGULATION WITH LASER Left 09/07/2016   Procedure: PHOTOCOAGULATION WITH LASER;  Surgeon: Donzell Arlyce Budd, MD;  Location: [facility];  Service: Ophthalmology;  Laterality: Left;  LEFT   TONSILLECTOMY     Social History   Tobacco Use   Smoking status: Never   Smokeless tobacco: Never   Vaping Use   Vaping status: Never Used  Substance Use Topics   Alcohol use: No   Drug use: No   Social History   Socioeconomic History   Marital status: Single    Spouse name: Not on file   Number of children: Not on file   Years of education: Not on file   Highest education level: Not on file  Occupational History   Not on file  Tobacco Use   Smoking status: Never   Smokeless tobacco: Never  Vaping Use   Vaping status: Never Used  Substance and Sexual Activity   Alcohol use: No   Drug use: No   Sexual activity: Not Currently    Birth control/protection: Surgical, Post-menopausal  Other Topics Concern   Not on file  Social History Narrative   Not on file   Social Drivers of Health   Financial Resource Strain: Low Risk  (03/17/2024)   Overall Financial Resource Strain (CARDIA)    Difficulty of Paying Living Expenses: Not hard at all  Food Insecurity: No Food Insecurity (03/17/2024)   Hunger Vital Sign    Worried About Running Out of Food in the Last Year: Never true    Ran Out of Food in the Last Year: Never true  Transportation Needs: No Transportation Needs (03/17/2024)   PRAPARE - Administrator, Civil Service (Medical): No    Lack of Transportation (Non-Medical): No  Physical Activity: Insufficiently Active (03/17/2024)   Exercise Vital Sign    Days of Exercise per Week: 3 days    Minutes of Exercise per Session: 30 min  Stress: No Stress Concern Present (03/17/2024)   [employer] of Occupational Health - Occupational Stress Questionnaire    Feeling of Stress : Not at all  Social Connections: Unknown (03/17/2024)   Social Connection and Isolation Panel    Frequency of Communication with Friends and Family: More than three times a week    Frequency of Social Gatherings with Friends and Family: Twice a week    Attends Religious Services: More than 4 times per year    Active Member of [employer] or Organizations: No    Attends Banker  Meetings: Never    Marital Status: Patient declined  Catering manager Violence: Not At Risk (03/17/2024)   Humiliation, Afraid, Rape, and Kick questionnaire    Fear of Current or Ex-Partner: No    Emotionally Abused: No    Physically Abused: No    Sexually Abused: No   Family Status  Relation Name Status   Mother  Alive   Father  Alive   Neg Hx  (Not Specified)  No partnership data on file   Family History  Problem Relation Age of Onset   Heart disease Mother    Hypertension Mother    Heart failure Mother    Kidney disease Father    Stroke Father    Colon polyps Neg Hx    Colon cancer Neg  Hx    Allergies  Allergen Reactions   Contrast Media [Iodinated Contrast Media] Shortness Of Breath   Iodine Shortness Of Breath   Sulfasalazine Itching and Swelling   Dye Fdc Red [Red Dye #40 (Allura Red)]    Lactose Intolerance (Gi)     GI upset   Penicillins     Childhood event-unknown reaction   Shellfish Allergy Nausea Only and Swelling    Throat swelling   Sulfa Antibiotics Itching and Swelling   Venlafaxine Swelling    Throat   Codeine Palpitations   Ropinirole Nausea Only      Review of Systems  Gastrointestinal:  Positive for diarrhea.  Psychiatric/Behavioral:  The patient is nervous/anxious.   All other systems reviewed and are negative.     Objective:     BP 124/78 (Cuff Size: Large)   Pulse 61   Temp 97.9 F (36.6 C) (Oral)   Resp 14   Ht 5' 2 (1.575 m)   Wt 157 lb 14.4 oz (71.6 kg)   SpO2 97%   BMI 28.88 kg/m  BP Readings from Last 3 Encounters:  08/22/24 124/78  06/08/24 122/84  03/17/24 130/76   Wt Readings from Last 3 Encounters:  08/22/24 157 lb 14.4 oz (71.6 kg)  06/08/24 157 lb 9.6 oz (71.5 kg)  03/17/24 150 lb (68 kg)      Physical Exam Constitutional:      Appearance: Normal appearance.  HENT:     Head: Normocephalic and atraumatic.  Eyes:     Conjunctiva/sclera: Conjunctivae normal.  Cardiovascular:     Rate and Rhythm:  Normal rate and regular rhythm.  Pulmonary:     Effort: Pulmonary effort is normal.     Breath sounds: Normal breath sounds.  Skin:    General: Skin is warm and dry.  Neurological:     General: No focal deficit present.     Mental Status: She is alert. Mental status is at baseline.  Psychiatric:        Mood and Affect: Mood normal.        Behavior: Behavior normal.      No results found for any visits on 08/22/24.   Last CBC Lab Results  Component Value Date   WBC 8.3 06/08/2024   HGB 10.7 (L) 06/08/2024   HCT 34.1 (L) 06/08/2024   MCV 89.3 06/08/2024   MCH 28.0 06/08/2024   RDW 14.7 06/08/2024   PLT 295 06/08/2024   Last metabolic panel Lab Results  Component Value Date   GLUCOSE 121 (H) 06/08/2024   NA 136 06/08/2024   K 4.4 06/08/2024   CL 105 06/08/2024   CO2 22 06/08/2024   BUN 14 06/08/2024   CREATININE 0.97 (H) 06/08/2024   GFRNONAA 47 (L) 06/17/2023   CALCIUM 8.9 06/08/2024   PROT 6.1 06/08/2024   ALBUMIN 3.9 06/17/2023   BILITOT 0.5 06/08/2024   ALKPHOS 92 06/17/2023   AST 15 06/08/2024   ALT 10 06/08/2024   ANIONGAP 10 06/17/2023   Last lipids No results found for: CHOL, HDL, LDLCALC, LDLDIRECT, TRIG, CHOLHDL Last hemoglobin A1c Lab Results  Component Value Date   HGBA1C 5.8 (H) 03/17/2021   Last thyroid  functions Lab Results  Component Value Date   TSH 6.96 (H) 06/08/2024   Last vitamin D No results found for: 25OHVITD2, 25OHVITD3, VD25OH Last vitamin B12 and Folate Lab Results  Component Value Date   VITAMINB12 513 05/05/2022   FOLATE 8.2 05/05/2022      The ASCVD  Risk score (Arnett DK, et al., 2019) failed to calculate for the following reasons:   The 2019 ASCVD risk score is only valid for ages 37 to 23    Assessment & Plan:   Assessment & Plan Mixed urinary incontinence and suspected urinary tract infection Symptoms indicate mixed incontinence with possible overflow and potential UTI. - Order urine test  for infection which was negative.  - Discuss potential insurance coverage for incontinence supplies. - Provide durable medical equipment order for incontinence supplies.  Depression and anxiety Increased stress and emotional distress noted. Discussed increasing Prozac  to manage symptoms with future reassessment. - Increase Prozac  to 40 mg. - Reassess in six months to consider returning to previous dose.  Chronic diarrhea Diarrhea managed with medication adjusted based on dietary intake. - Refill medication for diarrhea management. - Advise to continue monitoring dietary intake to manage symptoms.   - FLUoxetine  (PROZAC ) 40 MG capsule; Take 1 capsule (40 mg total) by mouth daily.  Dispense: 90 capsule; Refill: 1 - cholestyramine light (PREVALITE) 4 g packet; Take 1 packet (4 g total) by mouth daily.  Dispense: 90 each; Refill: 1 - POCT Urinalysis Dipstick - For home use only DME Other see comment    Return for already scheduled.    Sharyle Fischer, DO

## 2024-08-22 NOTE — Patient Instructions (Addendum)
 It was great seeing you today!  Plan discussed at today's visit: -Increase Prozac  to 40 mg to help with stress/anxiety -Cholestyramine packets refilled -Urine tested today for infection -DME orders for urinary incontinence supplies sent to insurance for approval - they will call and ask if you prefer them to mail or pick up at pharmacy -Flu vaccine given today -Handicap placard form completed - [facility] to [facility]  Follow up in: January - already scheduled  Take care and let us  know if you have any questions or concerns prior to your next visit.  Dr. Bernardo

## 2024-08-24 ENCOUNTER — Telehealth: Payer: Self-pay

## 2024-08-24 NOTE — Telephone Encounter (Signed)
 Copied from CRM #[id number]. Topic: General - Other >> Aug 24, 2024 10:54 AM Pinkey ORN wrote: Reason for CRM: [facility] >> Aug 24, 2024 10:57 AM Pinkey ORN wrote: [facility] - [phone]  Called on behalf of patient, states they're unable to fulfill the request for incontinent supplies as patient insurance is Medicare and not Medicaid. The supplies are only covered through [facility] programs.

## 2024-08-25 NOTE — Telephone Encounter (Signed)
 Called patient NA (phone vm not set up)

## 2024-08-25 NOTE — Telephone Encounter (Signed)
 Spoke to [employer] [facility] [place]. Patient can get supplies from there [place] office every 1st Thursday ay [place] [facility] [place] [address] [phone] take Id to show proof of residence and call to make sure supplies she use is stock

## 2024-10-10 ENCOUNTER — Other Ambulatory Visit: Payer: Self-pay | Admitting: Internal Medicine

## 2024-10-10 DIAGNOSIS — I10 Essential (primary) hypertension: Secondary | ICD-10-CM

## 2024-10-10 DIAGNOSIS — F32A Depression, unspecified: Secondary | ICD-10-CM

## 2024-10-10 DIAGNOSIS — F419 Anxiety disorder, unspecified: Secondary | ICD-10-CM

## 2024-10-11 NOTE — Telephone Encounter (Signed)
 Second request

## 2024-10-28 ENCOUNTER — Encounter: Payer: Self-pay | Admitting: Emergency Medicine

## 2024-10-28 ENCOUNTER — Other Ambulatory Visit: Payer: Self-pay

## 2024-10-28 ENCOUNTER — Emergency Department

## 2024-10-28 ENCOUNTER — Emergency Department
Admission: EM | Admit: 2024-10-28 | Discharge: 2024-10-28 | Disposition: A | Discharge status: Home / Self Care | Attending: Emergency Medicine | Admitting: Emergency Medicine

## 2024-10-28 DIAGNOSIS — W01198A Fall on same level from slipping, tripping and stumbling with subsequent striking against other object, initial encounter: Secondary | ICD-10-CM | POA: Diagnosis not present

## 2024-10-28 DIAGNOSIS — I10 Essential (primary) hypertension: Secondary | ICD-10-CM | POA: Insufficient documentation

## 2024-10-28 DIAGNOSIS — J449 Chronic obstructive pulmonary disease, unspecified: Secondary | ICD-10-CM | POA: Insufficient documentation

## 2024-10-28 DIAGNOSIS — S0093XA Contusion of unspecified part of head, initial encounter: Secondary | ICD-10-CM | POA: Diagnosis not present

## 2024-10-28 DIAGNOSIS — S0990XA Unspecified injury of head, initial encounter: Secondary | ICD-10-CM | POA: Diagnosis not present

## 2024-10-28 DIAGNOSIS — S51811A Laceration without foreign body of right forearm, initial encounter: Secondary | ICD-10-CM | POA: Diagnosis not present

## 2024-10-28 DIAGNOSIS — W19XXXA Unspecified fall, initial encounter: Secondary | ICD-10-CM

## 2024-10-28 NOTE — ED Notes (Addendum)
 Pt alert and oriented. Fell at [place] last night. Hit head on the corner of the door frame. PT states she feels  fuzzy. States she feels like she has a concussion. Skin tear noted to R wrist. Knot noted to posterior head. Muscles strength equal bilaterally

## 2024-10-28 NOTE — ED Provider Notes (Signed)
 [facility] Provider Note    Event Date/Time   First MD Initiated Contact with Patient 10/28/24 1516     (approximate)  History   Chief Complaint: Fall  HPI  Pamela Reed is a 89 y.o. female with a past medical history of COPD, gastric reflux, glaucoma, hypertension, presents to the emergency department after a head injury last night.  According to the patient she had taken a Benadryl  last night due to some itching, states she was healing very tired was attempting to walk backwards a couple steps and lost her balance causing her to fall hitting her head on a door frame.  Patient denies LOC.  Patient did state a hematoma to the back/top of her head which she has been using ice on.  Patient states very minimal neck pain.  Patient did hit her right arm where she suffered a small skin tear to her forearm no other injuries.  Physical Exam   Triage Vital Signs: ED Triage Vitals [10/28/24 1509]  Encounter Vitals Group     BP 130/71     Girls Systolic BP Percentile      Girls Diastolic BP Percentile      Boys Systolic BP Percentile      Boys Diastolic BP Percentile      Pulse Rate 62     Resp 20     Temp 97.9 F (36.6 C)     Temp Source Oral     SpO2 97 %     Weight 155 lb (70.3 kg)     Height 5' 2 (1.575 m)     Head Circumference      Peak Flow      Pain Score 5     Pain Loc      Pain Education      Exclude from Growth Chart     Most recent vital signs: Vitals:   10/28/24 1509  BP: 130/71  Pulse: 62  Resp: 20  Temp: 97.9 F (36.6 C)  SpO2: 97%    General: Awake, no distress.  CV:  Good peripheral perfusion.  Regular rate and rhythm  Resp:  Normal effort.  Equal breath sounds bilaterally.  Abd:  No distention.   Other:  Small skin tear to right forearm.  Minimal cervical spine tenderness to palpation more so paraspinal.  No obvious signs of head injury no hematoma noted on my evaluation.  Patient does state some mild tenderness to  occipital palpation.   ED Results / Procedures / Treatments   RADIOLOGY  CT scan of the head and C-spine are negative for acute abnormality.   MEDICATIONS ORDERED IN ED: Medications - No data to display   IMPRESSION / MDM / ASSESSMENT AND PLAN / ED COURSE  I reviewed the triage vital signs and the nursing notes.  Patient's presentation is most consistent with acute presentation with potential threat to life or bodily function.  Patient presents emergency department after tripping/falling backwards hitting her head on a door frame last night.  No LOC.  Overall the patient appears well, no distress.  Given the patient's mild tenderness to this area mild tenderness to the neck and her age we will obtain a CT scan of the head and C-spine as precaution.  We will continue to closely monitor while awaiting results.  Patient agreeable to plan of care.  Overall the patient appears very well, no distress.  CT scan of the head and C-spine are negative for acute abnormality.  Will discharge from the emergency department outpatient follow-up.  Recommended Tylenol  as needed for discomfort.  FINAL CLINICAL IMPRESSION(S) / ED DIAGNOSES   Fall Head injury   Note:  This document was prepared using Dragon voice recognition software and may include unintentional dictation errors.   Dorothyann Drivers, MD 10/28/24 [phone]

## 2024-10-28 NOTE — ED Notes (Signed)
 Patient transported to CT

## 2024-10-28 NOTE — ED Triage Notes (Signed)
 Patient arrives ambulatory by POV with her rolling walker. Patient states she was backing up with her walker last night in the dark and fell backwards into door frame and hit back of head. C/o pain to head and states she hit her right arm. Denies any back pain.

## 2024-11-02 ENCOUNTER — Ambulatory Visit: Payer: Self-pay

## 2024-11-02 NOTE — Telephone Encounter (Signed)
 FYI Only or Action Required?: FYI only for provider: appointment scheduled on 11/03/24.  Patient was last seen in primary care on 08/22/2024 by Bernardo Fend, DO.  Called Nurse Triage reporting Headache and Nausea (/).  Symptoms began several days ago.  Interventions attempted: OTC medications: Tylenol .  Symptoms are: gradually improving.  Triage Disposition: See PCP When Office is Open (Within 3 Days)  Patient/caregiver understands and will follow disposition?: Yes    Copied from CRM #[id number]. Topic: Clinical - Red Word Triage >> Nov 02, 2024  9:57 AM Avram MATSU wrote: Red Word that prompted transfer to Nurse Triage: patient is still feeling dizziness and nausea and vision blurry. Reason for Disposition  [1] After 3 days AND [2] headache persists  Answer Assessment - Initial Assessment Questions Pt went to ED on 12/6 after losing balance and falling backwards/hitting back of head on a door frame. Hematoma on back of head. Did not LOC. Denies taking blood thinners. Denies other injuries.  Head and neck CT scan negative. Has been having headaches and nausea since. No emesis. Headache pain currently 4-5/10 and taking tylenol . No changes to speech. AAO and speaking in clear coherent sentences. No numbness, tingling or weakness. Reports vision very ever so slightly worse. Scheduled appt with different provider at home office tomorrow morning d/t no PCP availability within timeframe. Advised ED for worsening symptoms.    1. MECHANISM: How did the injury happen? For falls, ask: What height did you fall from? and What surface did you fall against?      Lost balance and fell back hitting head on door frame 2. ONSET: When did the injury happen? (e.g., minutes, hours ago)      12/6 3. NEUROLOGIC SYMPTOMS: Was there any loss of consciousness? Are there any other neurological symptoms?      Denies all 4. MENTAL STATUS: Does the person know who they are, who you are, and where  they are?      Yes 5. LOCATION: What part of the head was hit?      Back of head 6. SCALP APPEARANCE: What does the scalp look like? Is it bleeding now? If Yes, ask: Is it difficult to stop?      No cuts, has a bump since falling that has been gradually shrinking 7. SIZE: For cuts, bruises, or swelling, ask: How large is it? (e.g., inches or centimeters)      Unsure how big the bump is, has been decreasing in size. 8. PAIN: Is there any pain? If Yes, ask: How bad is it? (Scale 0-10; or none, mild, moderate, severe)     4-5/10 9. TETANUS: For any breaks in the skin, ask: When was your last tetanus booster?     N/a 10. BLOOD THINNERS: Do you take any blood thinners? (e.g., aspirin , clopidogrel / Plavix, coumadin, heparin). Notes: Other strong blood thinners include: Arixtra (fondaparinux), Eliquis (apixaban), Pradaxa (dabigatran), and Xarelto (rivaroxaban).       No 11. OTHER SYMPTOMS: Do you have any other symptoms? (e.g., neck pain, vomiting)       Mild nausea, no emesis  Protocols used: Head Injury-A-AH

## 2024-11-03 ENCOUNTER — Ambulatory Visit: Admitting: Nurse Practitioner

## 2024-11-03 ENCOUNTER — Encounter: Payer: Self-pay | Admitting: Nurse Practitioner

## 2024-11-03 VITALS — BP 130/88 | HR 70 | Temp 97.0°F | Ht 62.0 in | Wt 159.0 lb

## 2024-11-03 DIAGNOSIS — R110 Nausea: Secondary | ICD-10-CM

## 2024-11-03 DIAGNOSIS — R300 Dysuria: Secondary | ICD-10-CM

## 2024-11-03 LAB — POCT URINALYSIS DIPSTICK
Glucose, UA: NEGATIVE
Ketones, UA: NEGATIVE
Protein, UA: POSITIVE — AB
Spec Grav, UA: 1.01 (ref 1.010–1.025)
Urobilinogen, UA: 0.2 U/dL
pH, UA: 5 (ref 5.0–8.0)

## 2024-11-03 MED ORDER — ONDANSETRON 4 MG PO TBDP: 4.0000 mg | ORAL_TABLET | Freq: Three times a day (TID) | ORAL | 0 refills | Status: AC | PRN

## 2024-11-03 MED ORDER — NITROFURANTOIN MONOHYD MACRO 100 MG PO CAPS: 100.0000 mg | ORAL_CAPSULE | Freq: Two times a day (BID) | ORAL | 0 refills | Status: AC

## 2024-11-03 NOTE — Progress Notes (Addendum)
 BP 130/88   Pulse 70   Temp (!) 97 F (36.1 C)   Ht 5' 2 (1.575 m)   Wt 159 lb (72.1 kg)   SpO2 97%   BMI 29.08 kg/m    Subjective:    Patient ID: Pamela Reed Phlegm, female    DOB: [birth year], 89 y.o.   MRN: [id number]  HPI: Pamela Reed is a 89 y.o. female  Chief Complaint  Patient presents with   Fall    Pt states she has felt nauseous and slightly dizzy since fall.    Dysuria    States she has increased urinary urgency but not much comes out.    Discussed the use of AI scribe software for clinical note transcription with the patient, who gave verbal consent to proceed.  History of Present Illness [name] is a 89 year old female who presents for ER follow-up after a fall.  Head trauma and headache - Fall occurred on October 28, 2024 while walking backwards, resulting in impact to the head on a doorframe - No loss of consciousness - Hematoma developed on the back and top of the head, treated with ice - CT scan of head and cervical spine showed no acute findings; cervical stenosis noted - CT head showed swelling but no fracture or foreign body - Persistent headache since the fall, affecting eyes and ears - Pain improved but not resolved with Tylenol  - No dizziness - Unsteady gait, using walker for support -neuro exam reassuring  Urinary symptoms - Increased urinary urgency and frequency over the past few days - Suspects urinary tract infection - History of kidney issues - Not drinking enough water  Gastrointestinal symptoms - Nausea upon waking  Constitutional symptoms - Chills without fever  Mood changes - Feeling depressed and not her usual self - Attributes mood changes to recent events and current health issues     EXAM: CT CERVICAL SPINE WITHOUT CONTRAST 10/28/2024 04:08:26 PM   TECHNIQUE: CT of the cervical spine was performed without the administration of intravenous contrast. Multiplanar reformatted images are provided for  review. Automated exposure control, iterative reconstruction, and/or weight based adjustment of the mA/kV was utilized to reduce the radiation dose to as low as reasonably achievable.   COMPARISON: None available.   CLINICAL HISTORY: Neck trauma (Age >= 65y). Patient fell backwards last night, hit head on door frame.   FINDINGS:   CERVICAL SPINE:   BONES AND ALIGNMENT: No acute fracture or traumatic malalignment. Grade 1 degenerative anterolisthesis is present at C3-C4.   DEGENERATIVE CHANGES: Advanced facet hypertrophy is present at C3-C4 with moderate foraminal narrowing bilaterally. New symmetric right sided facet hypertrophy is present at C4-C5 with moderate right foraminal narrowing. Moderate right foraminal narrowing is present at C5-C6 and C6-C7. Severe right foraminal stenosis is present at C7-T1.   SOFT TISSUES: No prevertebral soft tissue swelling.   IMPRESSION: 1. No acute abnormality of the cervical spine related to the reported trauma. 2. 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 3. Grade 1 degenerative anterolisthesis at C3-4 with advanced facet hypertrophy and moderate foraminal narrowing bilaterally. 4. New symmetric right-sided facet hypertrophy at C4-5 with moderate right foraminal narrowing. 5. Moderate right foraminal narrowing at C5-C6 and C6-C7.  CT HEAD WITHOUT CONTRAST 10/28/2024 04:08:26 PM   TECHNIQUE: CT of the head was performed without the administration of intravenous contrast. Automated exposure control, iterative reconstruction, and/or weight based adjustment of the mA/kV was utilized to reduce the radiation dose to as low  as reasonably achievable.   COMPARISON: 10/20/2021   CLINICAL HISTORY: Head trauma, minor (Age >= 65y)   FINDINGS:   BRAIN AND VENTRICLES: Moderate atrophy and white matter changes are similar to the prior exam. No acute hemorrhage. No evidence of acute infarct. No hydrocephalus. No extra-axial collection.  No mass effect or midline shift.   ORBITS: Bilateral lens replacements are noted. The globes and orbits are otherwise within normal limits. Right scleral banding is noted.   SINUSES: No acute abnormality.   SOFT TISSUES AND SKULL: Right parietal scalp soft tissue swelling is present without underlying skull fracture or foreign body.   IMPRESSION: 1. No acute intracranial abnormality. 2. Right parietal scalp soft tissue swelling without underlying fracture or foreign body.      11/03/2024   10:06 AM 08/22/2024   11:01 AM 03/17/2024   11:19 AM  Depression screen PHQ 2/9  Decreased Interest 2 0 0  Down, Depressed, Hopeless 3 0 0  PHQ - 2 Score 5 0 0  Altered sleeping 2  0  Tired, decreased energy 3  0  Change in appetite 0  0  Feeling bad or failure about yourself  0  0  Trouble concentrating 0  0  Moving slowly or fidgety/restless 2  0  Suicidal thoughts 0  0  PHQ-9 Score 12  0   Difficult doing work/chores   Not difficult at all     Data saved with a previous flowsheet row definition    Relevant past medical, surgical, family and social history reviewed and updated as indicated. Interim medical history since our last visit reviewed. Allergies and medications reviewed and updated.  Review of Systems  Ten systems reviewed and is negative except as mentioned in HPI      Objective:      BP 130/88   Pulse 70   Temp (!) 97 F (36.1 C)   Ht 5' 2 (1.575 m)   Wt 159 lb (72.1 kg)   SpO2 97%   BMI 29.08 kg/m    Wt Readings from Last 3 Encounters:  11/03/24 159 lb (72.1 kg)  10/28/24 155 lb (70.3 kg)  08/22/24 157 lb 14.4 oz (71.6 kg)    Physical Exam GENERAL: Alert, cooperative, well developed, no acute distress. HEENT: Normocephalic, normal oropharynx, moist mucous membranes. NECK: Supple, no pain. CHEST: Clear to auscultation bilaterally, no wheezes, rhonchi, or crackles. CARDIOVASCULAR: Normal heart rate and rhythm, S1 and S2 normal without  murmurs. ABDOMEN: Soft, non-tender, non-distended, without organomegaly, normal bowel sounds. EXTREMITIES: No cyanosis or edema. NEUROLOGICAL: Cranial nerves grossly intact, moves all extremities without gross motor or sensory deficit, motor strength 5/5 in upper extremities, sensation intact and symmetric in upper extremities, awake and alert.  Results for orders placed or performed in visit on 11/03/24  POCT Urinalysis Dipstick   Collection Time: 11/03/24 10:25 AM  Result Value Ref Range   Color, UA yellow    Clarity, UA clear    Glucose, UA Negative Negative   Bilirubin, UA small    Ketones, UA negative    Spec Grav, UA 1.010 1.010 - 1.025   Blood, UA large    pH, UA 5.0 5.0 - 8.0   Protein, UA Positive (A) Negative   Urobilinogen, UA 0.2 0.2 or 1.0 E.U./dL   Nitrite, UA large    Leukocytes, UA Large (3+) (A) Negative   Appearance     Odor            Assessment & Plan:  Problem List Items Addressed This Visit   None Visit Diagnoses       Dysuria    -  Primary   Relevant Medications   nitrofurantoin , macrocrystal-monohydrate, (MACROBID ) 100 MG capsule   Other Relevant Orders   POCT Urinalysis Dipstick (Completed)   Urine Culture     Nausea       Relevant Medications   ondansetron  (ZOFRAN -ODT) 4 MG disintegrating tablet        Assessment and Plan Assessment & Plan Concussion Mild concussion following a fall with head impact. CT scan showed scalp soft tissue swelling without underlying fracture or intracranial bleeding. Symptoms include headache, photophobia, and mild nausea. No dizziness or significant neurological deficits. Symptoms may be exacerbated by urinary tract infection. - Advised use of Tylenol  for headache management. - Encouraged hydration to aid recovery. - Instructed to avoid bright lights to prevent symptom exacerbation. - Instructed to monitor symptoms and report if she worsens by Monday. - Will consider repeat imaging if symptoms do not  improve by Monday.  Urinary tract infection Increased urinary urgency and frequency with positive urine culture indicating infection. Symptoms may contribute to systemic malaise and headache. - Prescribed antibiotic for urinary tract infection.sent in macrobid  - Sent urine sample for culture to identify causative bacteria. - Advised increased fluid intake to aid recovery.  Nausea Mild nausea potentially related to concussion and urinary tract infection. I wake up every morning a little bit nauseated. - Prescribed anti-nausea medication as needed. Zofran  sent in        Follow up plan: Return if symptoms worsen or fail to improve, for call me on monday and let me know how you are doing.

## 2024-11-05 LAB — URINE CULTURE
MICRO NUMBER:: 17350622
SPECIMEN QUALITY:: ADEQUATE

## 2024-11-06 ENCOUNTER — Other Ambulatory Visit: Payer: Self-pay | Admitting: Internal Medicine

## 2024-11-06 ENCOUNTER — Other Ambulatory Visit: Payer: Self-pay

## 2024-11-06 ENCOUNTER — Emergency Department: Admission: EM | Admit: 2024-11-06 | Discharge: 2024-11-06 | Disposition: A | Discharge status: Home / Self Care

## 2024-11-06 ENCOUNTER — Ambulatory Visit: Payer: Self-pay | Admitting: Nurse Practitioner

## 2024-11-06 DIAGNOSIS — N3000 Acute cystitis without hematuria: Secondary | ICD-10-CM | POA: Insufficient documentation

## 2024-11-06 DIAGNOSIS — I10 Essential (primary) hypertension: Secondary | ICD-10-CM

## 2024-11-06 DIAGNOSIS — R300 Dysuria: Secondary | ICD-10-CM | POA: Diagnosis present

## 2024-11-06 DIAGNOSIS — R519 Headache, unspecified: Secondary | ICD-10-CM | POA: Diagnosis not present

## 2024-11-06 DIAGNOSIS — F0781 Postconcussional syndrome: Secondary | ICD-10-CM | POA: Diagnosis not present

## 2024-11-06 DIAGNOSIS — G44309 Post-traumatic headache, unspecified, not intractable: Secondary | ICD-10-CM | POA: Diagnosis not present

## 2024-11-06 LAB — URINALYSIS, ROUTINE W REFLEX MICROSCOPIC
Bilirubin Urine: NEGATIVE
Glucose, UA: NEGATIVE mg/dL
Hgb urine dipstick: NEGATIVE
Ketones, ur: NEGATIVE mg/dL
Nitrite: NEGATIVE
Protein, ur: NEGATIVE mg/dL
Specific Gravity, Urine: 1.006 (ref 1.005–1.030)
pH: 6 (ref 5.0–8.0)

## 2024-11-06 LAB — BASIC METABOLIC PANEL WITH GFR
Anion gap: 13 (ref 5–15)
BUN: 13 mg/dL (ref 8–23)
CO2: 23 mmol/L (ref 22–32)
Calcium: 9.6 mg/dL (ref 8.9–10.3)
Chloride: 100 mmol/L (ref 98–111)
Creatinine, Ser: 1.04 mg/dL — ABNORMAL HIGH (ref 0.44–1.00)
GFR, Estimated: 50 mL/min — ABNORMAL LOW (ref >60)
Glucose, Bld: 134 mg/dL — ABNORMAL HIGH (ref 70–99)
Potassium: 4 mmol/L (ref 3.5–5.1)
Sodium: 136 mmol/L (ref 135–145)

## 2024-11-06 LAB — CBC
HCT: 39.8 % (ref 36.0–46.0)
Hemoglobin: 12.2 g/dL (ref 12.0–15.0)
MCH: 27.9 pg (ref 26.0–34.0)
MCHC: 30.7 g/dL (ref 30.0–36.0)
MCV: 91.1 fL (ref 80.0–100.0)
Platelets: 339 K/uL (ref 150–400)
RBC: 4.37 MIL/uL (ref 3.87–5.11)
RDW: 15.9 % — ABNORMAL HIGH (ref 11.5–15.5)
WBC: 9.2 K/uL (ref 4.0–10.5)
nRBC: 0 % (ref 0.0–0.2)

## 2024-11-06 LAB — LACTIC ACID, PLASMA: Lactic Acid, Venous: 0.9 mmol/L (ref 0.5–1.9)

## 2024-11-06 MED ORDER — CEFUROXIME AXETIL 500 MG PO TABS: 500.0000 mg | ORAL_TABLET | Freq: Two times a day (BID) | ORAL | 0 refills | Status: AC

## 2024-11-06 MED ORDER — CEFUROXIME AXETIL 500 MG PO TABS
500.0000 mg | ORAL_TABLET | Freq: Once | ORAL | Status: AC
  Administered 2024-11-06: 20:00:00 500 mg via ORAL
  Filled 2024-11-06: qty 1

## 2024-11-06 NOTE — ED Triage Notes (Signed)
 Pt to ED via POV from home. Pt reports was seen her eon 12/6 after fall. Pt reports has been having HA since. Pt also reports recently dx with UTI and urine culture showed ecoli. Pt reports on antibiotics and is still have pain/burning with urination. Pt reports sent by Dr. Bernardo.

## 2024-11-06 NOTE — Discharge Instructions (Signed)
 You were seen today due to concern of a headache in the setting of recent fall.  I suspect you likely have a concussion.  Please be sure to get plenty of rest drink plenty of water and avoid excessive screen time over the next 1 to 2 weeks.  I have given you the contact information for our neurologist who you may call to arrange for a follow-up appointment should your symptoms continue for greater than 2 weeks.  I have also written for an antibiotic for you to take to help with treating symptoms of a possible urinary infection, please take this antibiotic as instructed.  If you have any worsening symptoms such as fevers chills confusion or any other symptoms you find concerning please return to the emergency department immediately for further medical management.

## 2024-11-06 NOTE — ED Provider Notes (Signed)
 [facility] Provider Note    Event Date/Time   First MD Initiated Contact with Patient 11/06/24 1846     (approximate)   History   Headache and UTI   HPI  Pamela Reed is a 89 y.o. female who presents today with concern of a headache and urinary tract infection.  I was just seen here about 1 week ago for a fall.  At that time imaging was without acute findings.  Patient was able to be discharged home but since then has had persistent headache lightheadedness and fatigue.  She followed up with her primary doctor she was diagnosed with a urinary tract infection she was started on Macrobid .  She has 1 day remaining on her Macrobid  however she continues to have some burning with urination and discomfort.  Denies fevers chills episodes of passing out she has been able to ambulate appropriately no associated abdominal pain or other symptoms.     Physical Exam   Triage Vital Signs: ED Triage Vitals [11/06/24 1514]  Encounter Vitals Group     BP (!) 143/68     Girls Systolic BP Percentile      Girls Diastolic BP Percentile      Boys Systolic BP Percentile      Boys Diastolic BP Percentile      Pulse Rate 68     Resp 16     Temp 97.8 F (36.6 C)     Temp Source Oral     SpO2 96 %     Weight      Height      Head Circumference      Peak Flow      Pain Score 4     Pain Loc      Pain Education      Exclude from Growth Chart     Most recent vital signs: Vitals:   11/06/24 1514  BP: (!) 143/68  Pulse: 68  Resp: 16  Temp: 97.8 F (36.6 C)  SpO2: 96%     General: Awake, no distress.  CV:  Good peripheral perfusion.  Resp:  Normal effort.  Abd:  No distention.  Neuro:  Alert and oriented, answering questions appropriately, able to ambulate at baseline with assistance of walker Other:     ED Results / Procedures / Treatments   Labs (all labs ordered are listed, but only abnormal results are displayed) Labs Reviewed  URINALYSIS, ROUTINE  W REFLEX MICROSCOPIC - Abnormal; Notable for the following components:      Result Value   Color, Urine YELLOW (*)    APPearance CLEAR (*)    Leukocytes,Ua TRACE (*)    Bacteria, UA RARE (*)    All other components within normal limits  BASIC METABOLIC PANEL WITH GFR - Abnormal; Notable for the following components:   Glucose, Bld 134 (*)    Creatinine, Ser 1.04 (*)    GFR, Estimated 50 (*)    All other components within normal limits  CBC - Abnormal; Notable for the following components:   RDW 15.9 (*)    All other components within normal limits  LACTIC ACID, PLASMA     EKG     RADIOLOGY   PROCEDURES:  Critical Care performed: No  Procedures   MEDICATIONS ORDERED IN ED: Medications  cefUROXime  (CEFTIN ) tablet 500 mg (has no administration in time range)     IMPRESSION / MDM / ASSESSMENT AND PLAN / ED COURSE  I reviewed the triage vital  signs and the nursing notes.                               Patient's presentation is most consistent with acute, uncomplicated illness.  89 year old female who was recently seen for concern of a fall who presents today with concerns of a headache as well as dysuria.  She appears well she is not in any acute distress, she does not have any gross neurodeficits, vitals are within normal limits, blood pressure although is slightly elevated.  Labs are also reassuring, urinalysis shows trace leuk esterase as well as rare bacteria.  Given the presentation at this time I suspect she likely has a postconcussive syndrome resulting in her symptomatology, I discussed symptoms and presentation of this, and she also agrees with this likely diagnosis.  Given her persistent burning with urination, and her recent use of Macrobid , possible that she may continue to have a UTI, I went to give a third-generation cephalosporin she does have an allergy to penicillins.  I discussed risk of cross-reactivity, she verbalized understanding, we will monitor her  briefly after giving antibiotics and if this is without acute reaction, plan for discharge with follow-up in the outpatient setting.  I did provide her with contact information for neurology in the event that her symptoms persist that she may follow-up for further workup of postconcussive syndrome.       FINAL CLINICAL IMPRESSION(S) / ED DIAGNOSES   Final diagnoses:  Post concussive syndrome  Acute cystitis without hematuria     Rx / DC Orders   ED Discharge Orders          Ordered    cefUROXime  (CEFTIN ) 500 MG tablet  2 times daily with meals        11/06/24 1922             Note:  This document was prepared using Dragon voice recognition software and may include unintentional dictation errors.   Fernand Rossie HERO, MD 11/06/24 [phone]

## 2024-11-06 NOTE — ED Notes (Signed)
 See triage note  Presents with headache and possible UTI  States she fell about 2 weeks ago  Has been seen by PCP   Not feeling any better

## 2024-11-07 NOTE — Telephone Encounter (Signed)
 Second request

## 2024-11-10 ENCOUNTER — Ambulatory Visit: Payer: Self-pay

## 2024-11-10 NOTE — Telephone Encounter (Signed)
 Tried to call both #'s in chart.  No vm setup. If patient calls back advise ER/urgent care

## 2024-11-10 NOTE — Telephone Encounter (Signed)
 FYI Only or Action Required?: Action required by provider: clinical question for provider and update on patient condition.  Patient was last seen in primary care on 11/03/2024 by Gareth Mliss FALCON, FNP.  Called Nurse Triage reporting Diarrhea.  Symptoms began yesterday.  Interventions attempted: Rest, hydration, or home remedies.  Symptoms are: unchanged.  Triage Disposition: See Physician Within 24 Hours  Patient/caregiver understands and will follow disposition?: No, wishes to speak with PCP  Copied from CRM #[id number]. Topic: Clinical - Red Word Triage >> Nov 10, 2024 10:18 AM Pamela Reed wrote: Uncontrollable diarrhea Reason for Disposition  [1] MODERATE diarrhea (e.g., 4-6 times / day more than normal) AND [2] age > 70 years  Answer Assessment - Initial Assessment Questions Diarrhea has been going on since yesterday. Patient is asking for recommendations from provider. Requesting a call back.   1. DIARRHEA SEVERITY: How bad is the diarrhea? How many more stools have you had in the past 24 hours than normal?      Mild to moderate 2. ONSET: When did the diarrhea begin?      Started yesterday morning 3. STOOL DESCRIPTION:  How loose or watery is the diarrhea? What is the stool color? Is there any blood or mucous in the stool?     Loose and watery 4. VOMITING: Are you also vomiting? If Yes, ask: How many times in the past 24 hours?      no 5. ABDOMEN PAIN: Are you having any abdomen pain? If Yes, ask: What does it feel like? (e.g., crampy, dull, intermittent, constant)      intermittent 6. ABDOMEN PAIN SEVERITY: If present, ask: How bad is the pain?  (e.g., Scale 1-10; mild, moderate, or severe)     mild 7. ORAL INTAKE: If vomiting, Have you been able to drink liquids? How much liquids have you had in the past 24 hours?     Yes able to drink liquids 8. HYDRATION: Any signs of dehydration? (e.g., dry mouth [not just dry lips], too weak to stand,  dizziness, new weight loss) When did you last urinate?     Dry mouth on the regular, dizzy slightly 9. EXPOSURE: Have you traveled to a foreign country recently? Have you been exposed to anyone with diarrhea? Could you have eaten any food that was spoiled?     no 10. ANTIBIOTIC USE: Are you taking antibiotics now or have you taken antibiotics in the past 2 months?       yes 11. OTHER SYMPTOMS: Do you have any other symptoms? (e.g., fever, blood in stool)       no  Protocols used: Diarrhea-A-AH

## 2024-11-15 ENCOUNTER — Other Ambulatory Visit: Payer: Self-pay | Admitting: Internal Medicine

## 2024-11-15 DIAGNOSIS — I10 Essential (primary) hypertension: Secondary | ICD-10-CM

## 2024-11-17 NOTE — Telephone Encounter (Signed)
 Requested Prescriptions  Pending Prescriptions Disp Refills   metoprolol  succinate (TOPROL -XL) 25 MG 24 hr tablet [Pharmacy Med Name: METOPROLOL  SUCC ER 25 MG TAB] 180 tablet 0    Sig: TAKE 1 TABLET BY MOUTH 2 TIMES A DAY     Cardiovascular:  Beta Blockers Passed - 11/17/2024 11:24 AM      Passed - Last BP in normal range    BP Readings from Last 1 Encounters:  11/06/24 138/65         Passed - Last Heart Rate in normal range    Pulse Readings from Last 1 Encounters:  11/06/24 66         Passed - Valid encounter within last 6 months    Recent Outpatient Visits           2 weeks ago Dysuria   [facility] Gareth Mliss FALCON, FNP   2 months ago Anxiety   [facility] Bernardo Fend, DO   5 months ago Essential hypertension   [facility] Bernardo Fend, DO   8 months ago Vaginal discomfort   [facility] Bernardo Fend, DO   8 months ago Painful urination   [facility] Gareth Mliss FALCON, FNP       Future Appointments             In 3 weeks Bernardo Fend, DO [facility], [place]

## 2024-12-01 ENCOUNTER — Other Ambulatory Visit: Payer: Self-pay | Admitting: Internal Medicine

## 2024-12-01 DIAGNOSIS — E782 Mixed hyperlipidemia: Secondary | ICD-10-CM

## 2024-12-01 DIAGNOSIS — K219 Gastro-esophageal reflux disease without esophagitis: Secondary | ICD-10-CM

## 2024-12-01 LAB — LAB REPORT - SCANNED: EGFR: 43

## 2024-12-01 NOTE — Telephone Encounter (Signed)
 Requested Prescriptions  Pending Prescriptions Disp Refills   simvastatin  (ZOCOR ) 5 MG tablet [Pharmacy Med Name: SIMVASTATIN  5 MG TABLET] 90 tablet 0    Sig: TAKE 1 TABLET BY MOUTH AT BEDTIME     Cardiovascular:  Antilipid - Statins Failed - 12/01/2024  4:09 PM      Failed - Lipid Panel in normal range within the last 12 months    No results found for: CHOL, POCCHOL, CHOLTOT No results found for: LDLCALC, LDLC, HIRISKLDL, POCLDL, LDLDIRECT, REALLDLC, TOTLDLC No results found for: HDL, POCHDL No results found for: TRIG, POCTRIG       Passed - Patient is not pregnant      Passed - Valid encounter within last 12 months    Recent Outpatient Visits           4 weeks ago Dysuria   [facility] Gareth Mliss FALCON, FNP   3 months ago Anxiety   [facility] Bernardo Fend, DO   5 months ago Essential hypertension   [facility] Bernardo Fend, DO   9 months ago Vaginal discomfort   [facility] Bernardo Fend, DO   9 months ago Painful urination   [facility] Gareth Mliss FALCON, FNP       Future Appointments             In 1 week Bernardo Fend, DO [facility] [place]             pantoprazole  (PROTONIX ) 40 MG tablet [Pharmacy Med Name: PANTOPRAZOLE  SOD DR 40 MG TAB] 90 tablet 0    Sig: TAKE 1 TABLET BY MOUTH DAILY     Gastroenterology: Proton Pump Inhibitors Passed - 12/01/2024  4:09 PM      Passed - Valid encounter within last 12 months    Recent Outpatient Visits           4 weeks ago Dysuria   [facility] Gareth Mliss FALCON, FNP   3 months ago Anxiety   [facility] Bernardo Fend, DO   5 months ago Essential hypertension   [facility] Bernardo Fend, DO   9 months ago Vaginal discomfort    [facility] Bernardo Fend, DO   9 months ago Painful urination   [facility] Gareth Mliss FALCON, FNP       Future Appointments             In 1 week Bernardo Fend, DO [facility], [place]

## 2024-12-05 ENCOUNTER — Ambulatory Visit: Payer: Self-pay

## 2024-12-05 NOTE — Telephone Encounter (Signed)
 2nd attempt to contact patient, no answer-left voicemail  Copied from CRM [id number]. Topic: Clinical - Red Word Triage >> Dec 05, 2024  4:50 PM Wess RAMAN wrote: Red Word that prompted transfer to Nurse Triage: Trouble with UTIs- frequent urination, difficulty sleeping because it   Would like an appt

## 2024-12-05 NOTE — Telephone Encounter (Signed)
 Call disconnected during transfer to NT, attempted to call back with no answer-left voicemail.   Copied from CRM [id number]. Topic: Clinical - Red Word Triage >> Dec 05, 2024  4:50 PM Wess RAMAN wrote: Red Word that prompted transfer to Nurse Triage: Trouble with UTIs- frequent urination, difficulty sleeping because it  Would like an appt

## 2024-12-11 ENCOUNTER — Encounter: Payer: Self-pay | Admitting: Internal Medicine

## 2024-12-11 ENCOUNTER — Ambulatory Visit: Admitting: Internal Medicine

## 2024-12-11 VITALS — BP 136/72 | HR 71 | Temp 97.9°F | Resp 16 | Ht 62.0 in | Wt 153.5 lb

## 2024-12-11 DIAGNOSIS — L299 Pruritus, unspecified: Secondary | ICD-10-CM

## 2024-12-11 DIAGNOSIS — E039 Hypothyroidism, unspecified: Secondary | ICD-10-CM

## 2024-12-11 DIAGNOSIS — Z87440 Personal history of urinary (tract) infections: Secondary | ICD-10-CM

## 2024-12-11 DIAGNOSIS — R5383 Other fatigue: Secondary | ICD-10-CM

## 2024-12-11 DIAGNOSIS — S060X0D Concussion without loss of consciousness, subsequent encounter: Secondary | ICD-10-CM

## 2024-12-11 LAB — POCT URINALYSIS DIPSTICK
Bilirubin, UA: NEGATIVE
Blood, UA: NEGATIVE
Glucose, UA: NEGATIVE
Ketones, UA: NEGATIVE
Nitrite, UA: NEGATIVE
Protein, UA: NEGATIVE
Spec Grav, UA: 1.02
Urobilinogen, UA: 0.2 U/dL
pH, UA: 5

## 2024-12-11 NOTE — Progress Notes (Signed)
 "  Established Patient Office Visit  Subjective   Patient ID: Pamela Reed, female    DOB: 1931/03/05  Age: 89 y.o. MRN: [id number]  Chief Complaint  Patient presents with   Medical Management of Chronic Issues    HPI  Pamela Reed presents for recheck.  She is here with her daughter.   Discussed the use of AI scribe software for clinical note transcription with the patient, who gave verbal consent to proceed.  History of Present Illness  Pamela Reed is a 89 year old female who presents with persistent nausea and symptoms following a fall and head injury.  Approximately one month ago, she fell while backing up with her walker and hit her head on the side of a TV. Since then she has had persistent nausea, dizziness, light sensitivity, fatigue, and feels generally unwell.  After the fall she was diagnosed with an E. coli UTI. Her antibiotics were changed during treatment. She now has persistent nausea, worse in the morning, and is worried the UTI has recurred. Prior urine testing showed significant blood, nitrites, and leukocytes.  She has generalized itching that she links to stress and possibly medication. She uses Benadryl  cream and Sarna lotion, and scratches enough to leave marks on her skin. She takes oral Benadryl  at night for itching.  She has thyroid  disease treated with brand-name levothyroxine  after a prior switch to generic. She is concerned thyroid  imbalance may be contributing to her itching and other symptoms.  Recent labs showed GFR below 60. She drinks water, lemonade, and coffee and avoids soft drinks.    Patient Active Problem List   Diagnosis Date Noted   Mixed hyperlipidemia 12/21/2023   History of DVT of lower extremity 09/16/2023   Gastroesophageal reflux disease 09/16/2023   Anemia    Iron deficiency anemia 05/07/2022   Heme positive stool 05/06/2022   Thrombocytosis 05/05/2022   Diverticulitis 04/11/2022   Physical deconditioning 04/11/2022    Known medical problems 04/11/2022   Hyponatremia 03/17/2021   Hypokalemia 03/17/2021   Dysphagia 03/17/2021   Essential hypertension 03/17/2021   Hypothyroidism 03/17/2021   Anxiety 03/17/2021   Depression 03/17/2021   Glaucoma 03/17/2021   RLS (restless legs syndrome) 01/01/2017   B12 deficiency 11/18/2016   Chronic cystitis 12/17/2012   Mixed urge and stress incontinence 12/17/2012   Past Medical History:  Diagnosis Date   Arthritis    spine   Cervicalgia    Chronic cystitis    COPD (chronic obstructive pulmonary disease) (HCC)    Deafness in left ear    s/p skull fracture - age 73   Depression    GERD (gastroesophageal reflux disease)    Glaucoma    Headache    daily. 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   Hyperlipidemia    Hypertension    Hypothyroidism    Mixed incontinence urge and stress    Vertigo    Wears hearing aid    right ear   Past Surgical History:  Procedure Laterality Date   ABDOMINAL HYSTERECTOMY     CATARACT EXTRACTION W/ INTRAOCULAR LENS IMPLANT     CHOLECYSTECTOMY     COLONOSCOPY     ESOPHAGOGASTRODUODENOSCOPY  04/21/2022   [place]: mildly torturous esophagus, 7 cm hiatal hernia, oetherwise normal stomach and duodenum   ESOPHAGOGASTRODUODENOSCOPY (EGD) WITH PROPOFOL  N/A 05/12/2021   non-obstructing Schatzki ring  s/p dilation, 6 cm hiatal hernia, normal stomach, normal duodenum   ESOPHAGOGASTRODUODENOSCOPY (EGD) WITH PROPOFOL  N/A 05/05/2022   Procedure: ESOPHAGOGASTRODUODENOSCOPY (EGD) WITH PROPOFOL ;  Surgeon: Eartha Angelia Sieving, MD;  Location: [facility];  Service: Gastroenterology;  Laterality: N/A;   EYE SURGERY Bilateral    eye shunts   FOOT SURGERY     GIVENS CAPSULE STUDY N/A 05/05/2022   Procedure: GIVENS CAPSULE STUDY;  Surgeon: Eartha Angelia Sieving, MD;  Location: [facility];  Service: Gastroenterology;  Laterality: N/A;   GLAUCOMA SURGERY      PHOTOCOAGULATION WITH LASER Left 09/07/2016   Procedure: PHOTOCOAGULATION WITH LASER;  Surgeon: Donzell Arlyce Budd, MD;  Location[facility];  Service: Ophthalmology;  Laterality: Left;  LEFT   TONSILLECTOMY     Social History   Tobacco Use   Smoking status: Never   Smokeless tobacco: Never  Vaping Use   Vaping status: Never Used  Substance Use Topics   Alcohol use: No   Drug use: No   Social History   Socioeconomic History   Marital status: Single    Spouse name: Not on file   Number of children: Not on file   Years of education: Not on file   Highest education level: Not on file  Occupational History   Not on file  Tobacco Use   Smoking status: Never   Smokeless tobacco: Never  Vaping Use   Vaping status: Never Used  Substance and Sexual Activity   Alcohol use: No   Drug use: No   Sexual activity: Not Currently    Birth control/protection: Surgical, Post-menopausal  Other Topics Concern   Not on file  Social History Narrative   Not on file   Social Drivers of Health   Tobacco Use: Low Risk (12/11/2024)   Patient History    Smoking Tobacco Use: Never    Smokeless Tobacco Use: Never    Passive Exposure: Not on file  Financial Resource Strain: Low Risk (03/17/2024)   Overall Financial Resource Strain (CARDIA)    Difficulty of Paying Living Expenses: Not hard at all  Food Insecurity: No Food Insecurity (03/17/2024)   Hunger Vital Sign    Worried About Running Out of Food in the Last Year: Never true    Ran Out of Food in the Last Year: Never true  Transportation Needs: No Transportation Needs (03/17/2024)   PRAPARE - Administrator, Civil Service (Medical): No    Lack of Transportation (Non-Medical): No  Physical Activity: Insufficiently Active (03/17/2024)   Exercise Vital Sign    Days of Exercise per Week: 3 days    Minutes of Exercise per Session: 30 min  Stress: No Stress Concern Present (03/17/2024)   [employer] of  Occupational Health - Occupational Stress Questionnaire    Feeling of Stress : Not at all  Social Connections: Unknown (03/17/2024)   Social Connection and Isolation Panel    Frequency of Communication with Friends and Family: More than three times a week    Frequency of Social Gatherings with Friends and Family: Twice a week    Attends Religious Services: More than 4 times per year    Active Member of [employer] or Organizations: No    Attends Banker Meetings: Never    Marital Status: Patient declined  Intimate Partner Violence: Not At Risk (03/17/2024)   Humiliation, Afraid, Rape, and Kick questionnaire    Fear of Current or Ex-Partner: No    Emotionally Abused: No  Physically Abused: No    Sexually Abused: No  Depression (PHQ2-9): High Risk (11/03/2024)   Depression (PHQ2-9)    PHQ-2 Score: 12  Alcohol Screen: Low Risk (03/17/2024)   Alcohol Screen    Last Alcohol Screening Score (AUDIT): 0  Housing: Low Risk (03/17/2024)   Housing Stability Vital Sign    Unable to Pay for Housing in the Last Year: No    Number of Times Moved in the Last Year: 0    Homeless in the Last Year: No  Utilities: Not At Risk (03/17/2024)   AHC Utilities    Threatened with loss of utilities: No  Health Literacy: Adequate Health Literacy (03/17/2024)   B1300 Health Literacy    Frequency of need for help with medical instructions: Never   Family Status  Relation Name Status   Mother  Alive   Father  Alive   Neg Hx  (Not Specified)  No partnership data on file   Family History  Problem Relation Age of Onset   Heart disease Mother    Hypertension Mother    Heart failure Mother    Kidney disease Father    Stroke Father    Colon polyps Neg Hx    Colon cancer Neg Hx    Allergies  Allergen Reactions   Contrast Media [Iodinated Contrast Media] Shortness Of Breath   Iodine Shortness Of Breath   Sulfasalazine Itching and Swelling   Dye Fdc Red [Red Dye #40 (Allura Red)]    Lactose  Intolerance (Gi)     GI upset   Penicillins     Childhood event-unknown reaction   Shellfish Allergy Nausea Only and Swelling    Throat swelling   Sulfa Antibiotics Itching and Swelling   Venlafaxine Swelling    Throat   Codeine Palpitations   Ropinirole Nausea Only      Review of Systems  Constitutional:  Positive for malaise/fatigue.  Genitourinary: Negative.   Psychiatric/Behavioral:  The patient is nervous/anxious.   All other systems reviewed and are negative.     Objective:     BP 136/72 (Cuff Size: Large)   Pulse 71   Temp 97.9 F (36.6 C) (Oral)   Resp 16   Ht 5' 2 (1.575 m)   Wt 153 lb 8 oz (69.6 kg)   SpO2 97%   BMI 28.08 kg/m  BP Readings from Last 3 Encounters:  12/11/24 136/72  11/06/24 138/65  11/03/24 130/88   Wt Readings from Last 3 Encounters:  12/11/24 153 lb 8 oz (69.6 kg)  11/03/24 159 lb (72.1 kg)  10/28/24 155 lb (70.3 kg)      Physical Exam Constitutional:      Appearance: Normal appearance.  HENT:     Head: Normocephalic and atraumatic.  Eyes:     Conjunctiva/sclera: Conjunctivae normal.  Cardiovascular:     Rate and Rhythm: Normal rate and regular rhythm.  Pulmonary:     Effort: Pulmonary effort is normal.     Breath sounds: Normal breath sounds.  Skin:    General: Skin is warm and dry.  Neurological:     General: No focal deficit present.     Mental Status: She is alert. Mental status is at baseline.  Psychiatric:        Mood and Affect: Mood normal.        Behavior: Behavior normal.      No results found for any visits on 12/11/24.   Last CBC Lab Results  Component Value Date  WBC 9.2 11/06/2024   HGB 12.2 11/06/2024   HCT 39.8 11/06/2024   MCV 91.1 11/06/2024   MCH 27.9 11/06/2024   RDW 15.9 (H) 11/06/2024   PLT 339 11/06/2024   Last metabolic panel Lab Results  Component Value Date   GLUCOSE 134 (H) 11/06/2024   NA 136 11/06/2024   K 4.0 11/06/2024   CL 100 11/06/2024   CO2 23 11/06/2024   BUN  13 11/06/2024   CREATININE 1.04 (H) 11/06/2024   GFRNONAA 50 (L) 11/06/2024   CALCIUM 9.6 11/06/2024   PROT 6.1 06/08/2024   ALBUMIN 3.9 06/17/2023   BILITOT 0.5 06/08/2024   ALKPHOS 92 06/17/2023   AST 15 06/08/2024   ALT 10 06/08/2024   ANIONGAP 13 11/06/2024   Last lipids No results found for: CHOL, HDL, LDLCALC, LDLDIRECT, TRIG, CHOLHDL Last hemoglobin A1c Lab Results  Component Value Date   HGBA1C 5.8 (H) 03/17/2021   Last thyroid  functions Lab Results  Component Value Date   TSH 6.96 (H) 06/08/2024   Last vitamin D No results found for: 25OHVITD2, 25OHVITD3, VD25OH Last vitamin B12 and Folate Lab Results  Component Value Date   VITAMINB12 513 05/05/2022   FOLATE 8.2 05/05/2022      The ASCVD Risk score (Arnett DK, et al., 2019) failed to calculate for the following reasons:   The 2019 ASCVD risk score is only valid for ages 45 to 28   * - Cholesterol units were assumed    Assessment & Plan:   Assessment & Plan  Concussion After Fall  Symptoms include nausea, dizziness, sensitivity to light, and fatigue persisting for a month. Discussed risk of further head injury and need for caution. - Advised caution to prevent further head injury. - Educated on symptoms of concussion and potential for prolonged recovery.  Recent Urinary tract infection Previous UTI with E. coli. Current symptoms may relate to UTI or concussion. Previous urine showed blood, nitrates, and leukocytes. - Ordered urine test to check for current UTI. UA showing leukocytes, send for culture.  - Ensure adequate hydration to support kidney function.  Hypothyroidism Previous increase in thyroid  levels. Symptoms of pruritus may relate to thyroid  dysfunction. Currently on brand name levothyroxine . - Ordered blood work to recheck thyroid  levels. - Will adjust levothyroxine  dose if necessary based on lab results.  Pruritus Intermittent pruritus possibly related to stress or  anxiety. No specific dermatological cause identified. - Continue use of Benadryl  cream for pruritus. - Monitor for changes in symptoms.  Fatigue Persistent fatigue possibly related to concussion or thyroid  dysfunction.  - TSH - Comprehensive Metabolic Panel (CMET) - POCT Urinalysis Dipstick - Urine Culture  Return in about 3 months (around 03/11/2025).    Sharyle Fischer, DO "

## 2024-12-12 ENCOUNTER — Ambulatory Visit: Payer: Self-pay | Admitting: Internal Medicine

## 2024-12-12 DIAGNOSIS — N3090 Cystitis, unspecified without hematuria: Secondary | ICD-10-CM

## 2024-12-12 DIAGNOSIS — E039 Hypothyroidism, unspecified: Secondary | ICD-10-CM

## 2024-12-12 LAB — COMPREHENSIVE METABOLIC PANEL WITH GFR
AG Ratio: 1.6 (calc) (ref 1.0–2.5)
ALT: 11 U/L (ref 6–29)
AST: 13 U/L (ref 10–35)
Albumin: 4.4 g/dL (ref 3.6–5.1)
Alkaline phosphatase (APISO): 100 U/L (ref 37–153)
BUN/Creatinine Ratio: 15 (calc) (ref 6–22)
BUN: 16 mg/dL (ref 7–25)
CO2: 27 mmol/L (ref 20–32)
Calcium: 9.4 mg/dL (ref 8.6–10.4)
Chloride: 101 mmol/L (ref 98–110)
Creat: 1.08 mg/dL — ABNORMAL HIGH (ref 0.60–0.95)
Globulin: 2.7 g/dL (ref 1.9–3.7)
Glucose, Bld: 119 mg/dL — ABNORMAL HIGH (ref 65–99)
Potassium: 4.1 mmol/L (ref 3.5–5.3)
Sodium: 136 mmol/L (ref 135–146)
Total Bilirubin: 0.5 mg/dL (ref 0.2–1.2)
Total Protein: 7.1 g/dL (ref 6.1–8.1)
eGFR: 48 mL/min/1.73m2 — ABNORMAL LOW

## 2024-12-12 LAB — TSH: TSH: 0.48 m[IU]/L (ref 0.40–4.50)

## 2024-12-12 MED ORDER — LEVOTHYROXINE SODIUM 88 MCG PO TABS: 88.0000 ug | ORAL_TABLET | Freq: Every day | ORAL | 1 refills | Status: AC

## 2024-12-15 LAB — URINE CULTURE
MICRO NUMBER:: 17487227
SPECIMEN QUALITY:: ADEQUATE

## 2024-12-15 MED ORDER — CIPROFLOXACIN HCL 250 MG PO TABS: 250.0000 mg | ORAL_TABLET | Freq: Two times a day (BID) | ORAL | 0 refills | Status: AC

## 2025-03-12 ENCOUNTER — Ambulatory Visit: Admitting: Internal Medicine

## 2025-03-29 ENCOUNTER — Ambulatory Visit
